# Patient Record
Sex: Female | Born: 1945
Health system: Southern US, Community
[De-identification: ages and names within clinical notes are randomized; demographics above are authoritative.]

## PROBLEM LIST (undated history)

## (undated) DIAGNOSIS — M419 Scoliosis, unspecified: Secondary | ICD-10-CM

## (undated) DIAGNOSIS — E785 Hyperlipidemia, unspecified: Secondary | ICD-10-CM

## (undated) DIAGNOSIS — R0600 Dyspnea, unspecified: Secondary | ICD-10-CM

## (undated) DIAGNOSIS — K219 Gastro-esophageal reflux disease without esophagitis: Secondary | ICD-10-CM

## (undated) DIAGNOSIS — J45909 Unspecified asthma, uncomplicated: Secondary | ICD-10-CM

## (undated) DIAGNOSIS — J189 Pneumonia, unspecified organism: Secondary | ICD-10-CM

## (undated) DIAGNOSIS — I10 Essential (primary) hypertension: Secondary | ICD-10-CM

## (undated) HISTORY — PX: SKIN SURGERY: SHX2413

## (undated) HISTORY — DX: Hyperlipidemia, unspecified: E78.5

## (undated) HISTORY — PX: WRIST FRACTURE SURGERY: SHX121

---

## 1991-05-17 HISTORY — PX: DILATION AND CURETTAGE OF UTERUS: SHX78

## 1998-05-16 HISTORY — PX: BREAST SURGERY: SHX581

## 1998-07-16 ENCOUNTER — Ambulatory Visit (HOSPITAL_COMMUNITY): Admission: RE | Admit: 1998-07-16 | Discharge: 1998-07-16 | Payer: Self-pay | Admitting: Obstetrics & Gynecology

## 1999-08-19 ENCOUNTER — Encounter: Admission: RE | Admit: 1999-08-19 | Discharge: 1999-08-19 | Payer: Self-pay | Admitting: Gynecology

## 1999-08-19 ENCOUNTER — Encounter: Payer: Self-pay | Admitting: Gynecology

## 2000-09-07 ENCOUNTER — Encounter: Payer: Self-pay | Admitting: Gynecology

## 2000-09-07 ENCOUNTER — Encounter: Admission: RE | Admit: 2000-09-07 | Discharge: 2000-09-07 | Payer: Self-pay | Admitting: Gynecology

## 2000-09-11 ENCOUNTER — Other Ambulatory Visit: Admission: RE | Admit: 2000-09-11 | Discharge: 2000-09-11 | Payer: Self-pay | Admitting: Gynecology

## 2001-09-11 ENCOUNTER — Encounter: Payer: Self-pay | Admitting: Gynecology

## 2001-09-11 ENCOUNTER — Encounter: Admission: RE | Admit: 2001-09-11 | Discharge: 2001-09-11 | Payer: Self-pay | Admitting: Gynecology

## 2001-09-18 ENCOUNTER — Other Ambulatory Visit: Admission: RE | Admit: 2001-09-18 | Discharge: 2001-09-18 | Payer: Self-pay | Admitting: Gynecology

## 2002-09-13 ENCOUNTER — Encounter: Payer: Self-pay | Admitting: Family Medicine

## 2002-09-13 ENCOUNTER — Encounter: Admission: RE | Admit: 2002-09-13 | Discharge: 2002-09-13 | Payer: Self-pay | Admitting: Family Medicine

## 2003-09-17 ENCOUNTER — Encounter: Admission: RE | Admit: 2003-09-17 | Discharge: 2003-09-17 | Payer: Self-pay | Admitting: Family Medicine

## 2004-05-16 HISTORY — PX: FACIAL COSMETIC SURGERY: SHX629

## 2004-10-05 ENCOUNTER — Encounter: Admission: RE | Admit: 2004-10-05 | Discharge: 2004-10-05 | Payer: Self-pay | Admitting: Family Medicine

## 2005-09-06 ENCOUNTER — Ambulatory Visit: Payer: Self-pay | Admitting: Internal Medicine

## 2005-09-21 ENCOUNTER — Ambulatory Visit: Payer: Self-pay | Admitting: Cardiology

## 2005-11-09 ENCOUNTER — Ambulatory Visit: Payer: Self-pay | Admitting: Internal Medicine

## 2005-11-09 ENCOUNTER — Encounter: Admission: RE | Admit: 2005-11-09 | Discharge: 2005-11-09 | Payer: Self-pay | Admitting: Family Medicine

## 2007-01-19 ENCOUNTER — Encounter: Admission: RE | Admit: 2007-01-19 | Discharge: 2007-01-19 | Payer: Self-pay | Admitting: Family Medicine

## 2008-03-11 ENCOUNTER — Encounter: Admission: RE | Admit: 2008-03-11 | Discharge: 2008-03-11 | Payer: Self-pay | Admitting: Family Medicine

## 2008-04-25 ENCOUNTER — Encounter: Admission: RE | Admit: 2008-04-25 | Discharge: 2008-04-25 | Payer: Self-pay | Admitting: Gastroenterology

## 2008-05-27 ENCOUNTER — Encounter: Admission: RE | Admit: 2008-05-27 | Discharge: 2008-05-27 | Payer: Self-pay | Admitting: Gastroenterology

## 2009-03-12 ENCOUNTER — Encounter: Admission: RE | Admit: 2009-03-12 | Discharge: 2009-03-12 | Payer: Self-pay | Admitting: Family Medicine

## 2010-04-19 ENCOUNTER — Encounter: Admission: RE | Admit: 2010-04-19 | Discharge: 2010-04-19 | Payer: Self-pay | Admitting: Family Medicine

## 2011-04-14 ENCOUNTER — Other Ambulatory Visit: Payer: Self-pay | Admitting: Family Medicine

## 2011-04-14 DIAGNOSIS — Z1231 Encounter for screening mammogram for malignant neoplasm of breast: Secondary | ICD-10-CM

## 2011-04-21 ENCOUNTER — Ambulatory Visit
Admission: RE | Admit: 2011-04-21 | Discharge: 2011-04-21 | Disposition: A | Discharge status: Home / Self Care | Payer: Medicare Other | Source: Ambulatory Visit | Attending: Family Medicine | Admitting: Family Medicine

## 2011-04-21 DIAGNOSIS — Z1231 Encounter for screening mammogram for malignant neoplasm of breast: Secondary | ICD-10-CM

## 2011-05-16 ENCOUNTER — Ambulatory Visit: Payer: Self-pay

## 2011-06-03 DIAGNOSIS — Z23 Encounter for immunization: Secondary | ICD-10-CM | POA: Diagnosis not present

## 2011-06-03 DIAGNOSIS — Z0000 Encounter for general adult medical examination without abnormal findings: Secondary | ICD-10-CM | POA: Diagnosis not present

## 2011-06-30 DIAGNOSIS — J209 Acute bronchitis, unspecified: Secondary | ICD-10-CM | POA: Diagnosis not present

## 2011-06-30 DIAGNOSIS — J45909 Unspecified asthma, uncomplicated: Secondary | ICD-10-CM | POA: Diagnosis not present

## 2011-06-30 DIAGNOSIS — J301 Allergic rhinitis due to pollen: Secondary | ICD-10-CM | POA: Diagnosis not present

## 2011-07-26 DIAGNOSIS — Z86010 Personal history of colonic polyps: Secondary | ICD-10-CM | POA: Diagnosis not present

## 2011-08-01 DIAGNOSIS — L820 Inflamed seborrheic keratosis: Secondary | ICD-10-CM | POA: Diagnosis not present

## 2011-08-01 DIAGNOSIS — L821 Other seborrheic keratosis: Secondary | ICD-10-CM | POA: Diagnosis not present

## 2011-08-03 DIAGNOSIS — K5730 Diverticulosis of large intestine without perforation or abscess without bleeding: Secondary | ICD-10-CM | POA: Diagnosis not present

## 2011-08-03 DIAGNOSIS — Z86010 Personal history of colonic polyps: Secondary | ICD-10-CM | POA: Diagnosis not present

## 2011-08-03 LAB — HM COLONOSCOPY

## 2011-08-10 DIAGNOSIS — M5137 Other intervertebral disc degeneration, lumbosacral region: Secondary | ICD-10-CM | POA: Diagnosis not present

## 2011-08-10 DIAGNOSIS — M5030 Other cervical disc degeneration, unspecified cervical region: Secondary | ICD-10-CM | POA: Diagnosis not present

## 2011-08-10 DIAGNOSIS — M5430 Sciatica, unspecified side: Secondary | ICD-10-CM | POA: Diagnosis not present

## 2011-08-10 DIAGNOSIS — M999 Biomechanical lesion, unspecified: Secondary | ICD-10-CM | POA: Diagnosis not present

## 2011-08-10 DIAGNOSIS — M9981 Other biomechanical lesions of cervical region: Secondary | ICD-10-CM | POA: Diagnosis not present

## 2011-08-30 DIAGNOSIS — J45909 Unspecified asthma, uncomplicated: Secondary | ICD-10-CM | POA: Diagnosis not present

## 2011-08-30 DIAGNOSIS — J301 Allergic rhinitis due to pollen: Secondary | ICD-10-CM | POA: Diagnosis not present

## 2012-01-30 DIAGNOSIS — Z23 Encounter for immunization: Secondary | ICD-10-CM | POA: Diagnosis not present

## 2012-03-14 ENCOUNTER — Other Ambulatory Visit: Payer: Self-pay | Admitting: Family Medicine

## 2012-03-14 DIAGNOSIS — Z1231 Encounter for screening mammogram for malignant neoplasm of breast: Secondary | ICD-10-CM

## 2012-03-15 DIAGNOSIS — J45909 Unspecified asthma, uncomplicated: Secondary | ICD-10-CM | POA: Diagnosis not present

## 2012-03-15 DIAGNOSIS — J301 Allergic rhinitis due to pollen: Secondary | ICD-10-CM | POA: Diagnosis not present

## 2012-03-26 DIAGNOSIS — Z85828 Personal history of other malignant neoplasm of skin: Secondary | ICD-10-CM | POA: Diagnosis not present

## 2012-03-26 DIAGNOSIS — L851 Acquired keratosis [keratoderma] palmaris et plantaris: Secondary | ICD-10-CM | POA: Diagnosis not present

## 2012-03-26 DIAGNOSIS — L219 Seborrheic dermatitis, unspecified: Secondary | ICD-10-CM | POA: Diagnosis not present

## 2012-03-26 DIAGNOSIS — D485 Neoplasm of uncertain behavior of skin: Secondary | ICD-10-CM | POA: Diagnosis not present

## 2012-03-26 DIAGNOSIS — L570 Actinic keratosis: Secondary | ICD-10-CM | POA: Diagnosis not present

## 2012-04-19 DIAGNOSIS — M25819 Other specified joint disorders, unspecified shoulder: Secondary | ICD-10-CM | POA: Diagnosis not present

## 2012-04-24 ENCOUNTER — Ambulatory Visit
Admission: RE | Admit: 2012-04-24 | Discharge: 2012-04-24 | Disposition: A | Discharge status: Home / Self Care | Payer: Medicare Other | Source: Ambulatory Visit | Attending: Family Medicine | Admitting: Family Medicine

## 2012-04-24 DIAGNOSIS — M720 Palmar fascial fibromatosis [Dupuytren]: Secondary | ICD-10-CM | POA: Diagnosis not present

## 2012-04-24 DIAGNOSIS — Z1231 Encounter for screening mammogram for malignant neoplasm of breast: Secondary | ICD-10-CM | POA: Diagnosis not present

## 2012-06-06 DIAGNOSIS — H6690 Otitis media, unspecified, unspecified ear: Secondary | ICD-10-CM | POA: Diagnosis not present

## 2012-06-06 DIAGNOSIS — H6980 Other specified disorders of Eustachian tube, unspecified ear: Secondary | ICD-10-CM | POA: Diagnosis not present

## 2012-06-11 DIAGNOSIS — H6980 Other specified disorders of Eustachian tube, unspecified ear: Secondary | ICD-10-CM | POA: Diagnosis not present

## 2012-06-11 DIAGNOSIS — H6690 Otitis media, unspecified, unspecified ear: Secondary | ICD-10-CM | POA: Diagnosis not present

## 2012-06-12 DIAGNOSIS — H6120 Impacted cerumen, unspecified ear: Secondary | ICD-10-CM | POA: Diagnosis not present

## 2012-06-12 DIAGNOSIS — H60509 Unspecified acute noninfective otitis externa, unspecified ear: Secondary | ICD-10-CM | POA: Diagnosis not present

## 2012-06-14 DIAGNOSIS — H60509 Unspecified acute noninfective otitis externa, unspecified ear: Secondary | ICD-10-CM | POA: Diagnosis not present

## 2012-06-14 DIAGNOSIS — H6120 Impacted cerumen, unspecified ear: Secondary | ICD-10-CM | POA: Diagnosis not present

## 2012-06-25 DIAGNOSIS — H908 Mixed conductive and sensorineural hearing loss, unspecified: Secondary | ICD-10-CM | POA: Diagnosis not present

## 2012-06-25 DIAGNOSIS — H6120 Impacted cerumen, unspecified ear: Secondary | ICD-10-CM | POA: Diagnosis not present

## 2012-06-25 DIAGNOSIS — H7290 Unspecified perforation of tympanic membrane, unspecified ear: Secondary | ICD-10-CM | POA: Diagnosis not present

## 2012-06-25 DIAGNOSIS — H60509 Unspecified acute noninfective otitis externa, unspecified ear: Secondary | ICD-10-CM | POA: Diagnosis not present

## 2012-06-25 DIAGNOSIS — H905 Unspecified sensorineural hearing loss: Secondary | ICD-10-CM | POA: Diagnosis not present

## 2012-06-26 DIAGNOSIS — H6120 Impacted cerumen, unspecified ear: Secondary | ICD-10-CM | POA: Diagnosis not present

## 2012-06-26 DIAGNOSIS — H908 Mixed conductive and sensorineural hearing loss, unspecified: Secondary | ICD-10-CM | POA: Diagnosis not present

## 2012-06-26 DIAGNOSIS — H60509 Unspecified acute noninfective otitis externa, unspecified ear: Secondary | ICD-10-CM | POA: Diagnosis not present

## 2012-06-26 DIAGNOSIS — H7290 Unspecified perforation of tympanic membrane, unspecified ear: Secondary | ICD-10-CM | POA: Diagnosis not present

## 2012-06-27 DIAGNOSIS — H908 Mixed conductive and sensorineural hearing loss, unspecified: Secondary | ICD-10-CM | POA: Diagnosis not present

## 2012-06-27 DIAGNOSIS — H60509 Unspecified acute noninfective otitis externa, unspecified ear: Secondary | ICD-10-CM | POA: Diagnosis not present

## 2012-06-27 DIAGNOSIS — H7290 Unspecified perforation of tympanic membrane, unspecified ear: Secondary | ICD-10-CM | POA: Diagnosis not present

## 2012-06-27 DIAGNOSIS — H6120 Impacted cerumen, unspecified ear: Secondary | ICD-10-CM | POA: Diagnosis not present

## 2012-06-29 DIAGNOSIS — H905 Unspecified sensorineural hearing loss: Secondary | ICD-10-CM | POA: Diagnosis not present

## 2012-06-29 DIAGNOSIS — H6980 Other specified disorders of Eustachian tube, unspecified ear: Secondary | ICD-10-CM | POA: Diagnosis not present

## 2012-06-29 DIAGNOSIS — H908 Mixed conductive and sensorineural hearing loss, unspecified: Secondary | ICD-10-CM | POA: Diagnosis not present

## 2012-07-02 DIAGNOSIS — H7290 Unspecified perforation of tympanic membrane, unspecified ear: Secondary | ICD-10-CM | POA: Diagnosis not present

## 2012-07-02 DIAGNOSIS — H908 Mixed conductive and sensorineural hearing loss, unspecified: Secondary | ICD-10-CM | POA: Diagnosis not present

## 2012-07-02 DIAGNOSIS — H60509 Unspecified acute noninfective otitis externa, unspecified ear: Secondary | ICD-10-CM | POA: Diagnosis not present

## 2012-07-02 DIAGNOSIS — H6120 Impacted cerumen, unspecified ear: Secondary | ICD-10-CM | POA: Diagnosis not present

## 2012-07-17 DIAGNOSIS — H6120 Impacted cerumen, unspecified ear: Secondary | ICD-10-CM | POA: Diagnosis not present

## 2012-07-17 DIAGNOSIS — H7290 Unspecified perforation of tympanic membrane, unspecified ear: Secondary | ICD-10-CM | POA: Diagnosis not present

## 2012-07-17 DIAGNOSIS — H905 Unspecified sensorineural hearing loss: Secondary | ICD-10-CM | POA: Diagnosis not present

## 2012-07-17 DIAGNOSIS — B369 Superficial mycosis, unspecified: Secondary | ICD-10-CM | POA: Diagnosis not present

## 2012-08-20 DIAGNOSIS — B369 Superficial mycosis, unspecified: Secondary | ICD-10-CM | POA: Diagnosis not present

## 2012-08-20 DIAGNOSIS — H60509 Unspecified acute noninfective otitis externa, unspecified ear: Secondary | ICD-10-CM | POA: Diagnosis not present

## 2012-10-16 DIAGNOSIS — L821 Other seborrheic keratosis: Secondary | ICD-10-CM | POA: Diagnosis not present

## 2012-10-16 DIAGNOSIS — Z85828 Personal history of other malignant neoplasm of skin: Secondary | ICD-10-CM | POA: Diagnosis not present

## 2012-10-16 DIAGNOSIS — L578 Other skin changes due to chronic exposure to nonionizing radiation: Secondary | ICD-10-CM | POA: Diagnosis not present

## 2012-10-16 DIAGNOSIS — L820 Inflamed seborrheic keratosis: Secondary | ICD-10-CM | POA: Diagnosis not present

## 2012-10-16 DIAGNOSIS — L570 Actinic keratosis: Secondary | ICD-10-CM | POA: Diagnosis not present

## 2012-10-18 DIAGNOSIS — J301 Allergic rhinitis due to pollen: Secondary | ICD-10-CM | POA: Diagnosis not present

## 2012-10-18 DIAGNOSIS — J45909 Unspecified asthma, uncomplicated: Secondary | ICD-10-CM | POA: Diagnosis not present

## 2012-12-31 DIAGNOSIS — C4492 Squamous cell carcinoma of skin, unspecified: Secondary | ICD-10-CM | POA: Diagnosis not present

## 2012-12-31 DIAGNOSIS — L578 Other skin changes due to chronic exposure to nonionizing radiation: Secondary | ICD-10-CM | POA: Diagnosis not present

## 2012-12-31 DIAGNOSIS — Z85828 Personal history of other malignant neoplasm of skin: Secondary | ICD-10-CM | POA: Diagnosis not present

## 2012-12-31 DIAGNOSIS — D485 Neoplasm of uncertain behavior of skin: Secondary | ICD-10-CM | POA: Diagnosis not present

## 2012-12-31 DIAGNOSIS — C44621 Squamous cell carcinoma of skin of unspecified upper limb, including shoulder: Secondary | ICD-10-CM | POA: Diagnosis not present

## 2013-01-03 DIAGNOSIS — B369 Superficial mycosis, unspecified: Secondary | ICD-10-CM | POA: Diagnosis not present

## 2013-01-03 DIAGNOSIS — H60509 Unspecified acute noninfective otitis externa, unspecified ear: Secondary | ICD-10-CM | POA: Diagnosis not present

## 2013-01-03 DIAGNOSIS — H6120 Impacted cerumen, unspecified ear: Secondary | ICD-10-CM | POA: Diagnosis not present

## 2013-01-09 DIAGNOSIS — H60509 Unspecified acute noninfective otitis externa, unspecified ear: Secondary | ICD-10-CM | POA: Diagnosis not present

## 2013-03-14 DIAGNOSIS — L578 Other skin changes due to chronic exposure to nonionizing radiation: Secondary | ICD-10-CM | POA: Diagnosis not present

## 2013-03-14 DIAGNOSIS — D485 Neoplasm of uncertain behavior of skin: Secondary | ICD-10-CM | POA: Diagnosis not present

## 2013-03-14 DIAGNOSIS — Z85828 Personal history of other malignant neoplasm of skin: Secondary | ICD-10-CM | POA: Diagnosis not present

## 2013-03-14 DIAGNOSIS — L570 Actinic keratosis: Secondary | ICD-10-CM | POA: Diagnosis not present

## 2013-03-18 DIAGNOSIS — H6980 Other specified disorders of Eustachian tube, unspecified ear: Secondary | ICD-10-CM | POA: Diagnosis not present

## 2013-03-18 DIAGNOSIS — Z1330 Encounter for screening examination for mental health and behavioral disorders, unspecified: Secondary | ICD-10-CM | POA: Diagnosis not present

## 2013-03-18 DIAGNOSIS — Z1331 Encounter for screening for depression: Secondary | ICD-10-CM | POA: Diagnosis not present

## 2013-03-18 DIAGNOSIS — Z01419 Encounter for gynecological examination (general) (routine) without abnormal findings: Secondary | ICD-10-CM | POA: Diagnosis not present

## 2013-03-18 DIAGNOSIS — Z0000 Encounter for general adult medical examination without abnormal findings: Secondary | ICD-10-CM | POA: Diagnosis not present

## 2013-03-18 DIAGNOSIS — Z124 Encounter for screening for malignant neoplasm of cervix: Secondary | ICD-10-CM | POA: Diagnosis not present

## 2013-04-30 ENCOUNTER — Other Ambulatory Visit: Payer: Self-pay

## 2013-04-30 DIAGNOSIS — Z1231 Encounter for screening mammogram for malignant neoplasm of breast: Secondary | ICD-10-CM

## 2013-05-03 DIAGNOSIS — R7309 Other abnormal glucose: Secondary | ICD-10-CM | POA: Diagnosis not present

## 2013-05-03 DIAGNOSIS — E785 Hyperlipidemia, unspecified: Secondary | ICD-10-CM | POA: Diagnosis not present

## 2013-05-03 DIAGNOSIS — R7989 Other specified abnormal findings of blood chemistry: Secondary | ICD-10-CM | POA: Diagnosis not present

## 2013-05-20 DIAGNOSIS — H60509 Unspecified acute noninfective otitis externa, unspecified ear: Secondary | ICD-10-CM | POA: Diagnosis not present

## 2013-05-27 DIAGNOSIS — H60509 Unspecified acute noninfective otitis externa, unspecified ear: Secondary | ICD-10-CM | POA: Diagnosis not present

## 2013-06-04 DIAGNOSIS — H5200 Hypermetropia, unspecified eye: Secondary | ICD-10-CM | POA: Diagnosis not present

## 2013-06-18 DIAGNOSIS — Z85828 Personal history of other malignant neoplasm of skin: Secondary | ICD-10-CM | POA: Diagnosis not present

## 2013-06-18 DIAGNOSIS — L570 Actinic keratosis: Secondary | ICD-10-CM | POA: Diagnosis not present

## 2013-06-18 DIAGNOSIS — L578 Other skin changes due to chronic exposure to nonionizing radiation: Secondary | ICD-10-CM | POA: Diagnosis not present

## 2013-06-18 DIAGNOSIS — L820 Inflamed seborrheic keratosis: Secondary | ICD-10-CM | POA: Diagnosis not present

## 2013-06-18 DIAGNOSIS — L821 Other seborrheic keratosis: Secondary | ICD-10-CM | POA: Diagnosis not present

## 2013-07-12 DIAGNOSIS — M6740 Ganglion, unspecified site: Secondary | ICD-10-CM | POA: Diagnosis not present

## 2013-07-30 DIAGNOSIS — H60509 Unspecified acute noninfective otitis externa, unspecified ear: Secondary | ICD-10-CM | POA: Diagnosis not present

## 2013-07-30 DIAGNOSIS — H6120 Impacted cerumen, unspecified ear: Secondary | ICD-10-CM | POA: Diagnosis not present

## 2013-08-20 DIAGNOSIS — H6120 Impacted cerumen, unspecified ear: Secondary | ICD-10-CM | POA: Diagnosis not present

## 2013-08-20 DIAGNOSIS — H60509 Unspecified acute noninfective otitis externa, unspecified ear: Secondary | ICD-10-CM | POA: Diagnosis not present

## 2013-08-26 DIAGNOSIS — H60509 Unspecified acute noninfective otitis externa, unspecified ear: Secondary | ICD-10-CM | POA: Diagnosis not present

## 2013-08-27 DIAGNOSIS — M720 Palmar fascial fibromatosis [Dupuytren]: Secondary | ICD-10-CM | POA: Diagnosis not present

## 2013-09-16 DIAGNOSIS — D0470 Carcinoma in situ of skin of unspecified lower limb, including hip: Secondary | ICD-10-CM | POA: Diagnosis not present

## 2013-09-16 DIAGNOSIS — L820 Inflamed seborrheic keratosis: Secondary | ICD-10-CM | POA: Diagnosis not present

## 2013-09-16 DIAGNOSIS — D485 Neoplasm of uncertain behavior of skin: Secondary | ICD-10-CM | POA: Diagnosis not present

## 2013-09-16 DIAGNOSIS — Z85828 Personal history of other malignant neoplasm of skin: Secondary | ICD-10-CM | POA: Diagnosis not present

## 2013-09-16 DIAGNOSIS — L578 Other skin changes due to chronic exposure to nonionizing radiation: Secondary | ICD-10-CM | POA: Diagnosis not present

## 2013-10-01 DIAGNOSIS — D0470 Carcinoma in situ of skin of unspecified lower limb, including hip: Secondary | ICD-10-CM | POA: Diagnosis not present

## 2013-10-16 DIAGNOSIS — H60509 Unspecified acute noninfective otitis externa, unspecified ear: Secondary | ICD-10-CM | POA: Diagnosis not present

## 2013-10-29 DIAGNOSIS — H6120 Impacted cerumen, unspecified ear: Secondary | ICD-10-CM | POA: Diagnosis not present

## 2013-10-29 DIAGNOSIS — H60509 Unspecified acute noninfective otitis externa, unspecified ear: Secondary | ICD-10-CM | POA: Diagnosis not present

## 2013-10-31 DIAGNOSIS — H60509 Unspecified acute noninfective otitis externa, unspecified ear: Secondary | ICD-10-CM | POA: Diagnosis not present

## 2013-11-11 DIAGNOSIS — H60509 Unspecified acute noninfective otitis externa, unspecified ear: Secondary | ICD-10-CM | POA: Diagnosis not present

## 2014-01-23 ENCOUNTER — Ambulatory Visit
Admission: RE | Admit: 2014-01-23 | Discharge: 2014-01-23 | Disposition: A | Discharge status: Home / Self Care | Payer: Medicare Other | Source: Ambulatory Visit

## 2014-01-23 DIAGNOSIS — D239 Other benign neoplasm of skin, unspecified: Secondary | ICD-10-CM | POA: Diagnosis not present

## 2014-01-23 DIAGNOSIS — L578 Other skin changes due to chronic exposure to nonionizing radiation: Secondary | ICD-10-CM | POA: Diagnosis not present

## 2014-01-23 DIAGNOSIS — Z85828 Personal history of other malignant neoplasm of skin: Secondary | ICD-10-CM | POA: Diagnosis not present

## 2014-01-23 DIAGNOSIS — Z1283 Encounter for screening for malignant neoplasm of skin: Secondary | ICD-10-CM | POA: Diagnosis not present

## 2014-01-23 DIAGNOSIS — L820 Inflamed seborrheic keratosis: Secondary | ICD-10-CM | POA: Diagnosis not present

## 2014-01-23 DIAGNOSIS — Z1231 Encounter for screening mammogram for malignant neoplasm of breast: Secondary | ICD-10-CM

## 2014-01-23 DIAGNOSIS — L570 Actinic keratosis: Secondary | ICD-10-CM | POA: Diagnosis not present

## 2014-03-12 DIAGNOSIS — Z23 Encounter for immunization: Secondary | ICD-10-CM | POA: Diagnosis not present

## 2014-03-20 DIAGNOSIS — K759 Inflammatory liver disease, unspecified: Secondary | ICD-10-CM | POA: Diagnosis not present

## 2014-03-20 DIAGNOSIS — J4520 Mild intermittent asthma, uncomplicated: Secondary | ICD-10-CM | POA: Diagnosis not present

## 2014-03-20 DIAGNOSIS — Z1389 Encounter for screening for other disorder: Secondary | ICD-10-CM | POA: Diagnosis not present

## 2014-03-20 DIAGNOSIS — E785 Hyperlipidemia, unspecified: Secondary | ICD-10-CM | POA: Diagnosis not present

## 2014-03-20 DIAGNOSIS — Z0000 Encounter for general adult medical examination without abnormal findings: Secondary | ICD-10-CM | POA: Diagnosis not present

## 2014-03-21 DIAGNOSIS — R739 Hyperglycemia, unspecified: Secondary | ICD-10-CM | POA: Diagnosis not present

## 2014-03-21 DIAGNOSIS — E785 Hyperlipidemia, unspecified: Secondary | ICD-10-CM | POA: Diagnosis not present

## 2014-04-14 DIAGNOSIS — N3001 Acute cystitis with hematuria: Secondary | ICD-10-CM | POA: Diagnosis not present

## 2014-04-14 DIAGNOSIS — K759 Inflammatory liver disease, unspecified: Secondary | ICD-10-CM | POA: Diagnosis not present

## 2014-04-14 DIAGNOSIS — J4520 Mild intermittent asthma, uncomplicated: Secondary | ICD-10-CM | POA: Diagnosis not present

## 2014-04-14 DIAGNOSIS — E785 Hyperlipidemia, unspecified: Secondary | ICD-10-CM | POA: Diagnosis not present

## 2014-04-14 DIAGNOSIS — Z1389 Encounter for screening for other disorder: Secondary | ICD-10-CM | POA: Diagnosis not present

## 2014-04-16 DIAGNOSIS — Z23 Encounter for immunization: Secondary | ICD-10-CM | POA: Diagnosis not present

## 2014-04-16 DIAGNOSIS — Z1389 Encounter for screening for other disorder: Secondary | ICD-10-CM | POA: Diagnosis not present

## 2014-04-16 DIAGNOSIS — J4520 Mild intermittent asthma, uncomplicated: Secondary | ICD-10-CM | POA: Diagnosis not present

## 2014-04-16 DIAGNOSIS — K759 Inflammatory liver disease, unspecified: Secondary | ICD-10-CM | POA: Diagnosis not present

## 2014-04-16 DIAGNOSIS — E785 Hyperlipidemia, unspecified: Secondary | ICD-10-CM | POA: Diagnosis not present

## 2014-05-07 ENCOUNTER — Ambulatory Visit: Payer: Self-pay | Admitting: Family Medicine

## 2014-05-07 DIAGNOSIS — Z13820 Encounter for screening for osteoporosis: Secondary | ICD-10-CM | POA: Diagnosis not present

## 2014-05-07 DIAGNOSIS — Z780 Asymptomatic menopausal state: Secondary | ICD-10-CM | POA: Diagnosis not present

## 2014-05-07 DIAGNOSIS — M810 Age-related osteoporosis without current pathological fracture: Secondary | ICD-10-CM | POA: Diagnosis not present

## 2014-05-07 DIAGNOSIS — M8580 Other specified disorders of bone density and structure, unspecified site: Secondary | ICD-10-CM | POA: Diagnosis not present

## 2014-05-07 LAB — HM DEXA SCAN

## 2014-06-12 DIAGNOSIS — L821 Other seborrheic keratosis: Secondary | ICD-10-CM | POA: Diagnosis not present

## 2014-06-12 DIAGNOSIS — L814 Other melanin hyperpigmentation: Secondary | ICD-10-CM | POA: Diagnosis not present

## 2014-06-12 DIAGNOSIS — L820 Inflamed seborrheic keratosis: Secondary | ICD-10-CM | POA: Diagnosis not present

## 2014-06-12 DIAGNOSIS — L570 Actinic keratosis: Secondary | ICD-10-CM | POA: Diagnosis not present

## 2014-06-12 DIAGNOSIS — L578 Other skin changes due to chronic exposure to nonionizing radiation: Secondary | ICD-10-CM | POA: Diagnosis not present

## 2014-06-20 DIAGNOSIS — Z1389 Encounter for screening for other disorder: Secondary | ICD-10-CM | POA: Diagnosis not present

## 2014-06-20 DIAGNOSIS — E785 Hyperlipidemia, unspecified: Secondary | ICD-10-CM | POA: Diagnosis not present

## 2014-06-20 DIAGNOSIS — R739 Hyperglycemia, unspecified: Secondary | ICD-10-CM | POA: Diagnosis not present

## 2014-06-20 DIAGNOSIS — M810 Age-related osteoporosis without current pathological fracture: Secondary | ICD-10-CM | POA: Diagnosis not present

## 2014-06-20 DIAGNOSIS — K759 Inflammatory liver disease, unspecified: Secondary | ICD-10-CM | POA: Diagnosis not present

## 2014-07-10 DIAGNOSIS — H43811 Vitreous degeneration, right eye: Secondary | ICD-10-CM | POA: Diagnosis not present

## 2014-08-07 DIAGNOSIS — H43811 Vitreous degeneration, right eye: Secondary | ICD-10-CM | POA: Diagnosis not present

## 2014-09-10 DIAGNOSIS — M940 Chondrocostal junction syndrome [Tietze]: Secondary | ICD-10-CM | POA: Diagnosis not present

## 2014-09-10 DIAGNOSIS — E785 Hyperlipidemia, unspecified: Secondary | ICD-10-CM | POA: Diagnosis not present

## 2014-09-10 DIAGNOSIS — M549 Dorsalgia, unspecified: Secondary | ICD-10-CM | POA: Diagnosis not present

## 2014-09-10 DIAGNOSIS — Z1389 Encounter for screening for other disorder: Secondary | ICD-10-CM | POA: Diagnosis not present

## 2014-09-10 DIAGNOSIS — K759 Inflammatory liver disease, unspecified: Secondary | ICD-10-CM | POA: Diagnosis not present

## 2014-09-11 ENCOUNTER — Ambulatory Visit
Admit: 2014-09-11 | Disposition: A | Discharge status: Home / Self Care | Payer: Self-pay | Attending: Family Medicine | Admitting: Family Medicine

## 2014-09-11 DIAGNOSIS — J45909 Unspecified asthma, uncomplicated: Secondary | ICD-10-CM | POA: Diagnosis not present

## 2014-09-11 DIAGNOSIS — M8588 Other specified disorders of bone density and structure, other site: Secondary | ICD-10-CM | POA: Diagnosis not present

## 2014-09-11 DIAGNOSIS — M47815 Spondylosis without myelopathy or radiculopathy, thoracolumbar region: Secondary | ICD-10-CM | POA: Diagnosis not present

## 2014-09-11 DIAGNOSIS — M47813 Spondylosis without myelopathy or radiculopathy, cervicothoracic region: Secondary | ICD-10-CM | POA: Diagnosis not present

## 2014-09-11 DIAGNOSIS — J449 Chronic obstructive pulmonary disease, unspecified: Secondary | ICD-10-CM | POA: Diagnosis not present

## 2014-10-20 ENCOUNTER — Telehealth: Payer: Self-pay | Admitting: Family Medicine

## 2014-10-20 MED ORDER — AZELASTINE HCL 0.15 % NA SOLN: 1.0000 | Freq: Two times a day (BID) | NASAL | Status: DC

## 2014-10-20 NOTE — Telephone Encounter (Signed)
Pt is requesting for a refill on Astepro 0.15% 2 spray each daily.  [employer].  [phone]

## 2014-10-20 NOTE — Telephone Encounter (Signed)
Sent prescription into pharmacy. Notified pt. Renaldo Fiddler, CMA

## 2014-10-20 NOTE — Telephone Encounter (Signed)
Please put in order. Thanks

## 2014-10-31 DIAGNOSIS — R739 Hyperglycemia, unspecified: Secondary | ICD-10-CM | POA: Insufficient documentation

## 2014-10-31 DIAGNOSIS — R7989 Other specified abnormal findings of blood chemistry: Secondary | ICD-10-CM | POA: Insufficient documentation

## 2014-10-31 DIAGNOSIS — M810 Age-related osteoporosis without current pathological fracture: Secondary | ICD-10-CM | POA: Insufficient documentation

## 2014-10-31 DIAGNOSIS — E785 Hyperlipidemia, unspecified: Secondary | ICD-10-CM | POA: Insufficient documentation

## 2014-10-31 DIAGNOSIS — J302 Other seasonal allergic rhinitis: Secondary | ICD-10-CM | POA: Insufficient documentation

## 2014-10-31 DIAGNOSIS — J45909 Unspecified asthma, uncomplicated: Secondary | ICD-10-CM | POA: Insufficient documentation

## 2014-10-31 DIAGNOSIS — R945 Abnormal results of liver function studies: Secondary | ICD-10-CM | POA: Insufficient documentation

## 2014-10-31 HISTORY — DX: Other specified abnormal findings of blood chemistry: R79.89

## 2014-12-09 ENCOUNTER — Telehealth: Payer: Self-pay | Admitting: Family Medicine

## 2014-12-09 DIAGNOSIS — R739 Hyperglycemia, unspecified: Secondary | ICD-10-CM

## 2014-12-09 DIAGNOSIS — E785 Hyperlipidemia, unspecified: Secondary | ICD-10-CM

## 2014-12-09 NOTE — Telephone Encounter (Signed)
PT stated she was told to call a week before her appt with Dr. Venia Minks to request a lab slip for the following labs: lipid panel, Met C, & HGA/1C. Pt would like to pick up the lab slip Wednesday or Thursday morning to have the labs done Thursday morning. Thanks TNP

## 2014-12-09 NOTE — Telephone Encounter (Signed)
Printed labs slip for pt to pick up. Pt was advised that lab slips are ready to pick up, pt stated that she will pick them up tomorrow.

## 2014-12-11 DIAGNOSIS — E785 Hyperlipidemia, unspecified: Secondary | ICD-10-CM | POA: Diagnosis not present

## 2014-12-11 DIAGNOSIS — R739 Hyperglycemia, unspecified: Secondary | ICD-10-CM | POA: Diagnosis not present

## 2014-12-12 ENCOUNTER — Telehealth: Payer: Self-pay

## 2014-12-12 LAB — COMPREHENSIVE METABOLIC PANEL
A/G RATIO: 1.8 (ref 1.1–2.5)
ALK PHOS: 101 IU/L (ref 39–117)
ALT: 34 IU/L — AB (ref 0–32)
AST: 31 IU/L (ref 0–40)
Albumin: 4.4 g/dL (ref 3.5–4.8)
BILIRUBIN TOTAL: 0.5 mg/dL (ref 0.0–1.2)
BUN / CREAT RATIO: 20 (ref 11–26)
BUN: 16 mg/dL (ref 8–27)
CHLORIDE: 99 mmol/L (ref 97–108)
CO2: 23 mmol/L (ref 18–29)
CREATININE: 0.82 mg/dL (ref 0.57–1.00)
Calcium: 9.6 mg/dL (ref 8.7–10.3)
GFR, EST AFRICAN AMERICAN: 84 mL/min/{1.73_m2} (ref >59)
GFR, EST NON AFRICAN AMERICAN: 73 mL/min/{1.73_m2} (ref >59)
Globulin, Total: 2.5 g/dL (ref 1.5–4.5)
Glucose: 100 mg/dL — ABNORMAL HIGH (ref 65–99)
POTASSIUM: 4.4 mmol/L (ref 3.5–5.2)
Sodium: 140 mmol/L (ref 134–144)
TOTAL PROTEIN: 6.9 g/dL (ref 6.0–8.5)

## 2014-12-12 LAB — LIPID PANEL WITH LDL/HDL RATIO
Cholesterol, Total: 222 mg/dL — ABNORMAL HIGH (ref 100–199)
HDL: 72 mg/dL (ref >39)
LDL Calculated: 134 mg/dL — ABNORMAL HIGH (ref 0–99)
LDL/HDL RATIO: 1.9 ratio (ref 0.0–3.2)
TRIGLYCERIDES: 82 mg/dL (ref 0–149)
VLDL CHOLESTEROL CAL: 16 mg/dL (ref 5–40)

## 2014-12-12 LAB — HEMOGLOBIN A1C
Est. average glucose Bld gHb Est-mCnc: 117 mg/dL
HEMOGLOBIN A1C: 5.7 % — AB (ref 4.8–5.6)

## 2014-12-12 NOTE — Telephone Encounter (Signed)
-----   Message from Margarita Rana, MD sent at 12/12/2014  2:13 PM EDT ----- Labs stable. Cholesterol is about the same. 10 year risk of heart disease is 8 percent. Can start statin if she would like or continue to work on lifestyle changes.  Thanks.

## 2014-12-12 NOTE — Telephone Encounter (Signed)
Tried calling; no answer 12/12/2014  Thanks,   -Mickel Baas

## 2014-12-15 DIAGNOSIS — Z1283 Encounter for screening for malignant neoplasm of skin: Secondary | ICD-10-CM | POA: Diagnosis not present

## 2014-12-15 DIAGNOSIS — D485 Neoplasm of uncertain behavior of skin: Secondary | ICD-10-CM | POA: Diagnosis not present

## 2014-12-15 DIAGNOSIS — I789 Disease of capillaries, unspecified: Secondary | ICD-10-CM | POA: Diagnosis not present

## 2014-12-15 DIAGNOSIS — D1800 Hemangioma unspecified site: Secondary | ICD-10-CM | POA: Diagnosis not present

## 2014-12-15 DIAGNOSIS — D692 Other nonthrombocytopenic purpura: Secondary | ICD-10-CM | POA: Diagnosis not present

## 2014-12-15 DIAGNOSIS — L821 Other seborrheic keratosis: Secondary | ICD-10-CM | POA: Diagnosis not present

## 2014-12-15 DIAGNOSIS — L578 Other skin changes due to chronic exposure to nonionizing radiation: Secondary | ICD-10-CM | POA: Diagnosis not present

## 2014-12-15 DIAGNOSIS — L814 Other melanin hyperpigmentation: Secondary | ICD-10-CM | POA: Diagnosis not present

## 2014-12-15 DIAGNOSIS — Z85828 Personal history of other malignant neoplasm of skin: Secondary | ICD-10-CM | POA: Diagnosis not present

## 2014-12-15 DIAGNOSIS — D229 Melanocytic nevi, unspecified: Secondary | ICD-10-CM | POA: Diagnosis not present

## 2014-12-15 NOTE — Telephone Encounter (Signed)
Tried calling; no answer 12/15/2014   Thanks,   -Mickel Baas

## 2014-12-16 NOTE — Telephone Encounter (Signed)
Advised pt of the above results, pt stated that she has an appointment with MD tomorrow, and verbalized fully understanding of the results.

## 2014-12-17 ENCOUNTER — Encounter: Payer: Self-pay | Admitting: Family Medicine

## 2014-12-17 ENCOUNTER — Ambulatory Visit (INDEPENDENT_AMBULATORY_CARE_PROVIDER_SITE_OTHER): Payer: Medicare Other | Admitting: Family Medicine

## 2014-12-17 VITALS — BP 124/82 | HR 76 | Temp 97.9°F | Resp 16 | Ht 60.0 in | Wt 134.0 lb

## 2014-12-17 DIAGNOSIS — E785 Hyperlipidemia, unspecified: Secondary | ICD-10-CM | POA: Diagnosis not present

## 2014-12-17 DIAGNOSIS — R739 Hyperglycemia, unspecified: Secondary | ICD-10-CM

## 2014-12-17 MED ORDER — ASPIRIN 81 MG PO CHEW: 81.0000 mg | CHEWABLE_TABLET | Freq: Every day | ORAL | Status: DC

## 2014-12-17 NOTE — Progress Notes (Signed)
Subjective:    Patient ID: Alpha, female    DOB: Jan 11, 1946, 69 y.o.   MRN: [id number]  Hyperlipidemia Recent lipid tests were reviewed and are high. Pertinent negatives include no chest pain, focal sensory loss, focal weakness, leg pain, myalgias or shortness of breath. She is currently on no antihyperlipidemic treatment.  Pt was advised to consider starting a Statin vs working on lifestyle changes.  Hyperglycemia Pt comes in to FU on elevated BS. Pt denies polyuria, polyphagia, polydipsia, fatigue. Last A1C was 12/11/2014 and was 5.7%.  Review of Systems  Constitutional: Negative for fever, chills, diaphoresis, activity change, appetite change, fatigue and unexpected weight change.  Respiratory: Negative for shortness of breath.   Cardiovascular: Negative for chest pain, palpitations and leg swelling.  Endocrine: Negative for polydipsia, polyphagia and polyuria.  Musculoskeletal: Negative for myalgias.       Rib Pain is present  Neurological: Negative for focal weakness.     Recent Results (from the past 2160 hour(s))  Lipid Panel With LDL/HDL Ratio     Status: Abnormal   Collection Time: 12/11/14  8:13 AM  Result Value Ref Range   Cholesterol, Total 222 (H) 100 - 199 mg/dL   Triglycerides 82 0 - 149 mg/dL   HDL 72 >39 mg/dL    Comment: According to ATP-III Guidelines, HDL-C >59 mg/dL is considered a negative risk factor for CHD.    VLDL Cholesterol Cal 16 5 - 40 mg/dL   LDL Calculated 134 (H) 0 - 99 mg/dL   LDl/HDL Ratio 1.9 0.0 - 3.2 ratio units    Comment:                                     LDL/HDL Ratio                                             Men  Women                               1/2 Avg.Risk  1.0    1.5                                   Avg.Risk  3.6    3.2                                2X Avg.Risk  6.2    5.0                                3X Avg.Risk  8.0    6.1   Hemoglobin A1c     Status: Abnormal   Collection Time: 12/11/14  8:13 AM   Result Value Ref Range   Hgb A1c MFr Bld 5.7 (H) 4.8 - 5.6 %    Comment:          Pre-diabetes: 5.7 - 6.4          Diabetes: >6.4          Glycemic control for adults with diabetes: <7.0  Est. average glucose Bld gHb Est-mCnc 117 mg/dL  Comprehensive metabolic panel     Status: Abnormal   Collection Time: 12/11/14  8:13 AM  Result Value Ref Range   Glucose 100 (H) 65 - 99 mg/dL    Comment: Specimen received in contact with cells. No visible hemolysis present. However GLUC may be decreased and K increased. Clinical correlation indicated.    BUN 16 8 - 27 mg/dL   Creatinine, Ser 0.82 0.57 - 1.00 mg/dL   GFR calc non Af Amer 73 >59 mL/min/1.73   GFR calc Af Amer 84 >59 mL/min/1.73   BUN/Creatinine Ratio 20 11 - 26   Sodium 140 134 - 144 mmol/L   Potassium 4.4 3.5 - 5.2 mmol/L   Chloride 99 97 - 108 mmol/L   CO2 23 18 - 29 mmol/L   Calcium 9.6 8.7 - 10.3 mg/dL   Total Protein 6.9 6.0 - 8.5 g/dL   Albumin 4.4 3.5 - 4.8 g/dL   Globulin, Total 2.5 1.5 - 4.5 g/dL   Albumin/Globulin Ratio 1.8 1.1 - 2.5   Bilirubin Total 0.5 0.0 - 1.2 mg/dL   Alkaline Phosphatase 101 39 - 117 IU/L   AST 31 0 - 40 IU/L   ALT 34 (H) 0 - 32 IU/L   BP 124/82 mmHg  Pulse 76  Temp(Src) 97.9 F (36.6 C) (Oral)  Resp 16  Ht 5' (1.524 m)  Wt 134 lb (60.782 kg)  BMI 26.17 kg/m2   Patient Active Problem List   Diagnosis Date Noted  . Asthma 10/31/2014  . Abnormal LFTs 10/31/2014  . Blood glucose elevated 10/31/2014  . HLD (hyperlipidemia) 10/31/2014  . OP (osteoporosis) 10/31/2014  . Allergic rhinitis, seasonal 10/31/2014   No past medical history on file. Current Outpatient Prescriptions on File Prior to Visit  Medication Sig  . Azelastine HCl 0.15 % SOLN Place 1 spray into the nose 2 (two) times daily.  . budesonide-formoterol (SYMBICORT) 80-4.5 MCG/ACT inhaler Inhale into the lungs.  . meloxicam (MOBIC) 7.5 MG tablet Take by mouth.  . montelukast (SINGULAIR) 10 MG tablet Take by mouth.    No current facility-administered medications on file prior to visit.   Allergies  Allergen Reactions  . Celecoxib   . Sulfa Antibiotics   . Pseudoephedrine Rash   Past Surgical History  Procedure Laterality Date  . Facial cosmetic surgery  2006  . Breast surgery Left 2000    biopsy  . Dilation and curettage of uterus  1993  . Cesarean section  05/08/1978, 07/03/1979   History   Social History  . Marital Status: Widowed    Spouse Name: N/A  . Number of Children: N/A  . Years of Education: N/A   Occupational History  . Not on file.   Social History Main Topics  . Smoking status: Former Smoker    Quit date: 05/16/1975  . Smokeless tobacco: Never Used  . Alcohol Use: 2.4 - 4.8 oz/week    4-8 Glasses of wine per week  . Drug Use: No  . Sexual Activity: Not on file   Other Topics Concern  . Not on file   Social History Narrative   Family History  Problem Relation Age of Onset  . Heart murmur Mother   . Vision loss Father   . Bladder Cancer Brother        Objective:   Physical Exam  Constitutional: She is oriented to person, place, and time. She appears well-developed and well-nourished.  Neurological: She is alert  and oriented to person, place, and time.   BP 124/82 mmHg  Pulse 76  Temp(Src) 97.9 F (36.6 C) (Oral)  Resp 16  Ht 5' (1.524 m)  Wt 134 lb (60.782 kg)  BMI 26.17 kg/m2       Assessment & Plan:  1. HLD (hyperlipidemia) Cholesterol really only mildly elevated, with high good cholesterol. Discussed lifestyle modifications at length. Patient to continue lifestyle changes. Spent 20 plus minutes of face time discussing labs and strategies for improvement.   2. Blood glucose elevated As above. Will keep close eye on blood sugar. Keeping it under good control. If develops diabetes, will need to start cholesterol medication also. Recheck in 6 months.   Margarita Rana, MD

## 2014-12-19 ENCOUNTER — Other Ambulatory Visit: Payer: Self-pay

## 2014-12-19 DIAGNOSIS — Z1231 Encounter for screening mammogram for malignant neoplasm of breast: Secondary | ICD-10-CM

## 2015-01-12 DIAGNOSIS — Z23 Encounter for immunization: Secondary | ICD-10-CM | POA: Diagnosis not present

## 2015-03-05 ENCOUNTER — Ambulatory Visit
Admission: RE | Admit: 2015-03-05 | Discharge: 2015-03-05 | Disposition: A | Discharge status: Home / Self Care | Payer: Medicare Other | Source: Ambulatory Visit

## 2015-03-05 DIAGNOSIS — Z1231 Encounter for screening mammogram for malignant neoplasm of breast: Secondary | ICD-10-CM

## 2015-03-05 LAB — HM MAMMOGRAPHY

## 2015-03-23 ENCOUNTER — Encounter: Payer: Self-pay | Admitting: Family Medicine

## 2015-03-23 ENCOUNTER — Ambulatory Visit (INDEPENDENT_AMBULATORY_CARE_PROVIDER_SITE_OTHER): Payer: Medicare Other | Admitting: Family Medicine

## 2015-03-23 VITALS — BP 116/74 | HR 80 | Temp 97.9°F | Resp 16 | Ht 59.0 in | Wt 134.0 lb

## 2015-03-23 DIAGNOSIS — J302 Other seasonal allergic rhinitis: Secondary | ICD-10-CM | POA: Diagnosis not present

## 2015-03-23 DIAGNOSIS — R739 Hyperglycemia, unspecified: Secondary | ICD-10-CM

## 2015-03-23 DIAGNOSIS — E785 Hyperlipidemia, unspecified: Secondary | ICD-10-CM | POA: Diagnosis not present

## 2015-03-23 DIAGNOSIS — Z0000 Encounter for general adult medical examination without abnormal findings: Secondary | ICD-10-CM

## 2015-03-23 DIAGNOSIS — M1990 Unspecified osteoarthritis, unspecified site: Secondary | ICD-10-CM | POA: Diagnosis not present

## 2015-03-23 DIAGNOSIS — J45909 Unspecified asthma, uncomplicated: Secondary | ICD-10-CM

## 2015-03-23 MED ORDER — MONTELUKAST SODIUM 10 MG PO TABS: 10.0000 mg | ORAL_TABLET | Freq: Every day | ORAL | Status: DC

## 2015-03-23 MED ORDER — MELOXICAM 7.5 MG PO TABS: 7.5000 mg | ORAL_TABLET | Freq: Every day | ORAL | Status: DC

## 2015-03-23 MED ORDER — BUDESONIDE-FORMOTEROL FUMARATE 80-4.5 MCG/ACT IN AERO: 2.0000 | INHALATION_SPRAY | Freq: Two times a day (BID) | RESPIRATORY_TRACT | Status: DC

## 2015-03-23 NOTE — Progress Notes (Signed)
Patient ID: Cheryl Hays, female   DOB: [birth year], 69 y.o.   MRN: [id number]         Patient: Cheryl Hays, Female    DOB: 1945-12-01, 69 y.o.   MRN: [id number] Visit Date: 03/24/2015  Today's Provider: Margarita Rana, MD   Chief Complaint  Patient presents with  . Annual Exam   Subjective:    Annual wellness visit [name] is a 69 y.o. female. She feels well. She reports exercising regulary. She reports she is sleeping well.  Chronic problems stable.    -----------------------------------------------------------   Review of Systems  Social History   Social History  . Marital Status: Widowed    Spouse Name: N/A  . Number of Children: N/A  . Years of Education: N/A   Occupational History  . Not on file.   Social History Main Topics  . Smoking status: Former Smoker    Quit date: 05/16/1975  . Smokeless tobacco: Never Used  . Alcohol Use: 2.4 - 4.8 oz/week    4-8 Glasses of wine per week  . Drug Use: No  . Sexual Activity: Not on file   Other Topics Concern  . Not on file   Social History Narrative    Patient Active Problem List   Diagnosis Date Noted  . Asthma 10/31/2014  . Abnormal LFTs 10/31/2014  . Blood glucose elevated 10/31/2014  . HLD (hyperlipidemia) 10/31/2014  . OP (osteoporosis) 10/31/2014  . Allergic rhinitis, seasonal 10/31/2014    Past Surgical History  Procedure Laterality Date  . Facial cosmetic surgery  2006  . Breast surgery Left 2000    biopsy  . Dilation and curettage of uterus  1993  . Cesarean section  05/08/1978, 07/03/1979    Her family history includes Bladder Cancer in her brother; Heart murmur in her mother; Vision loss in her father.    Previous Medications   ASPIRIN 81 MG CHEWABLE TABLET    Chew 1 tablet (81 mg total) by mouth daily.   AZELASTINE HCL 0.15 % SOLN    Place 1 spray into the nose 2 (two) times daily.   CALCIUM CARB-CHOLECALCIFEROL (CALCIUM 600+D3) 600-800 MG-UNIT TABS    Take 1  tablet by mouth daily.    Patient Care Team: Margarita Rana, MD as PCP - General (Family Medicine)     Objective:   Vitals: BP 116/74 mmHg  Pulse 80  Temp(Src) 97.9 F (36.6 C) (Oral)  Resp 16  Ht 4\' 11"  (1.499 m)  Wt 134 lb (60.782 kg)  BMI 27.05 kg/m2  Physical Exam  Constitutional: She is oriented to person, place, and time. She appears well-developed and well-nourished.  HENT:  Head: Normocephalic and atraumatic.  Right Ear: Tympanic membrane, external ear and ear canal normal.  Left Ear: Tympanic membrane, external ear and ear canal normal.  Nose: Nose normal.  Mouth/Throat: Uvula is midline, oropharynx is clear and moist and mucous membranes are normal.  Eyes: Conjunctivae, EOM and lids are normal. Pupils are equal, round, and reactive to light. Lids are everted and swept, no foreign bodies found.  Neck: Trachea normal. Carotid bruit is not present.  Cardiovascular: Normal rate, regular rhythm, normal heart sounds and normal pulses.   Pulmonary/Chest: Effort normal and breath sounds normal.  Abdominal: Soft. Normal appearance, normal aorta and bowel sounds are normal. There is no tenderness.  Musculoskeletal: Normal range of motion.  Neurological: She is alert and oriented to person, place, and time.  Skin: Skin is warm, dry and intact.  Psychiatric: She has a normal mood and affect. Her speech is normal and behavior is normal. Judgment and thought content normal. Cognition and memory are normal.    Activities of Daily Living In your present state of health, do you have any difficulty performing the following activities: 03/23/2015  Hearing? N  Vision? N  Difficulty concentrating or making decisions? N  Walking or climbing stairs? N  Dressing or bathing? N  Doing errands, shopping? N    Fall Risk Assessment Fall Risk  03/23/2015  Falls in the past year? No     Depression Screen PHQ 2/9 Scores 03/23/2015  PHQ - 2 Score 0    Cognitive Testing - 6-CIT   Correct? Score   What year is it? yes 0 0 or 4  What month is it? yes 0 0 or 3  Memorize:    Pia Mau,  41,  [address]  [place],      What time is it? (within 1 hour) yes 0 0 or 3  Count backwards from 20 yes 0 0, 2, or 4  Name the months of the year yes 0 0, 2, or 4  Repeat name & address above yes 0 0, 2, 4, 6, 8, or 10       TOTAL SCORE  0/28   Interpretation:  Normal  Normal (0-7) Abnormal (8-28)       Assessment & Plan:     Annual Wellness Visit  Reviewed patient's Family Medical History Reviewed and updated list of patient's medical providers Assessment of cognitive impairment was done Assessed patient's functional ability Established a written schedule for health screening [place] Completed and Reviewed  Exercise Activities and Dietary recommendations Goals    None      Immunization History  Administered Date(s) Administered  . Pneumococcal Conjugate-13 04/16/2014  . Pneumococcal Polysaccharide-23 03/31/2008  . Tdap 06/03/2011  . Zoster 05/06/2009    Health Maintenance  Topic Date Due  . Hepatitis C Screening  [birth year]  . COLONOSCOPY  06/03/1994  . DEXA SCAN  06/03/2009  . PNA vac Low Risk Adult (2 of 2 - PPSV23) 04/17/2015  . INFLUENZA VACCINE  12/15/2015  . MAMMOGRAM  03/04/2017  . TETANUS/TDAP  06/02/2021  . ZOSTAVAX  Completed      Discussed health benefits of physical activity, and encouraged her to engage in regular exercise appropriate for her age and condition.    1. Medicare annual wellness visit, subsequent Stable as above.   2. HLD (hyperlipidemia) Stable. Check labs.   - Lipid panel - TSH  3. Blood glucose elevated Labs stable.  Check labs.  - Comprehensive metabolic panel - Hemoglobin A1c  4. Allergic rhinitis, seasonal Stable.  - CBC with Differential/Platelet  5. Arthritis Stable. Will continue Mobic.   - meloxicam (MOBIC) 7.5 MG tablet; Take 1 tablet (7.5 mg total) by mouth daily.   Dispense: 90 tablet; Refill: 1  6. Asthma, unspecified asthma severity, uncomplicated Will refill medication.  - montelukast (SINGULAIR) 10 MG tablet; Take 1 tablet (10 mg total) by mouth daily.  Dispense: 90 tablet; Refill: 1 - budesonide-formoterol (SYMBICORT) 80-4.5 MCG/ACT inhaler; Inhale 2 puffs into the lungs 2 (two) times daily.  Dispense: 1 Inhaler; Refill: 5  Patient was seen and examined by Jerrell Belfast, MD, and note scribed by Ashley Royalty, CMA.  I have reviewed the document for accuracy and completeness and I agree with above. Jerrell Belfast, MD   Margarita Rana, MD  ------------------------------------------------------------------------------------------------------------

## 2015-04-02 DIAGNOSIS — R739 Hyperglycemia, unspecified: Secondary | ICD-10-CM | POA: Diagnosis not present

## 2015-04-02 DIAGNOSIS — E785 Hyperlipidemia, unspecified: Secondary | ICD-10-CM | POA: Diagnosis not present

## 2015-04-02 DIAGNOSIS — J302 Other seasonal allergic rhinitis: Secondary | ICD-10-CM | POA: Diagnosis not present

## 2015-04-03 ENCOUNTER — Telehealth: Payer: Self-pay

## 2015-04-03 LAB — COMPREHENSIVE METABOLIC PANEL
ALBUMIN: 4.3 g/dL (ref 3.5–4.8)
ALT: 21 IU/L (ref 0–32)
AST: 23 IU/L (ref 0–40)
Albumin/Globulin Ratio: 1.8 (ref 1.1–2.5)
Alkaline Phosphatase: 90 IU/L (ref 39–117)
BUN/Creatinine Ratio: 22 (ref 11–26)
BUN: 16 mg/dL (ref 8–27)
Bilirubin Total: 0.6 mg/dL (ref 0.0–1.2)
CALCIUM: 9.5 mg/dL (ref 8.7–10.3)
CHLORIDE: 99 mmol/L (ref 97–106)
CO2: 28 mmol/L (ref 18–29)
Creatinine, Ser: 0.72 mg/dL (ref 0.57–1.00)
GFR calc Af Amer: 98 mL/min/{1.73_m2} (ref >59)
GFR calc non Af Amer: 85 mL/min/{1.73_m2} (ref >59)
GLOBULIN, TOTAL: 2.4 g/dL (ref 1.5–4.5)
Glucose: 95 mg/dL (ref 65–99)
POTASSIUM: 4.4 mmol/L (ref 3.5–5.2)
Sodium: 140 mmol/L (ref 136–144)
TOTAL PROTEIN: 6.7 g/dL (ref 6.0–8.5)

## 2015-04-03 LAB — CBC WITH DIFFERENTIAL/PLATELET
Basophils Absolute: 0 10*3/uL (ref 0.0–0.2)
Basos: 0 %
EOS (ABSOLUTE): 0.5 10*3/uL — ABNORMAL HIGH (ref 0.0–0.4)
EOS: 9 %
HEMATOCRIT: 42.3 % (ref 34.0–46.6)
HEMOGLOBIN: 14.4 g/dL (ref 11.1–15.9)
IMMATURE GRANULOCYTES: 0 %
Immature Grans (Abs): 0 10*3/uL (ref 0.0–0.1)
LYMPHS ABS: 2.2 10*3/uL (ref 0.7–3.1)
Lymphs: 41 %
MCH: 29.1 pg (ref 26.6–33.0)
MCHC: 34 g/dL (ref 31.5–35.7)
MCV: 86 fL (ref 79–97)
MONOCYTES: 9 %
Monocytes Absolute: 0.5 10*3/uL (ref 0.1–0.9)
Neutrophils Absolute: 2.2 10*3/uL (ref 1.4–7.0)
Neutrophils: 41 %
Platelets: 224 10*3/uL (ref 150–379)
RBC: 4.94 x10E6/uL (ref 3.77–5.28)
RDW: 14.8 % (ref 12.3–15.4)
WBC: 5.3 10*3/uL (ref 3.4–10.8)

## 2015-04-03 LAB — LIPID PANEL
CHOLESTEROL TOTAL: 227 mg/dL — AB (ref 100–199)
Chol/HDL Ratio: 3 ratio units (ref 0.0–4.4)
HDL: 76 mg/dL (ref >39)
LDL Calculated: 132 mg/dL — ABNORMAL HIGH (ref 0–99)
TRIGLYCERIDES: 94 mg/dL (ref 0–149)
VLDL CHOLESTEROL CAL: 19 mg/dL (ref 5–40)

## 2015-04-03 LAB — TSH: TSH: 3.1 u[IU]/mL (ref 0.450–4.500)

## 2015-04-03 LAB — HEMOGLOBIN A1C
Est. average glucose Bld gHb Est-mCnc: 120 mg/dL
Hgb A1c MFr Bld: 5.8 % — ABNORMAL HIGH (ref 4.8–5.6)

## 2015-04-03 NOTE — Telephone Encounter (Signed)
LMTCB 04/03/2015  Thanks,   -Mickel Baas

## 2015-04-03 NOTE — Telephone Encounter (Signed)
-----   Message from Margarita Rana, MD sent at 04/03/2015 10:09 AM EST ----- Labs stable. Cholesterol is elevated, only slightly secondary to very high good cholesterol. 10 year risk is 7.7 for heart disease, only slightly higher than if had normal cholesterol.  Can add medication if  Has strong family history or very concerned, or continue lifestyle changes and repeat annually . Thanks.

## 2015-04-08 NOTE — Telephone Encounter (Signed)
Patient advised as directed below. Patient expressed understanding. Per patient went to a cruise and think that's what affected her labs, prefers to continue her lifestyle changes because there is no family history of cholesterol. Will repeat labs in on year.  Thanks,  -Joseline

## 2015-12-07 DIAGNOSIS — H60339 Swimmer's ear, unspecified ear: Secondary | ICD-10-CM | POA: Diagnosis not present

## 2015-12-07 DIAGNOSIS — J209 Acute bronchitis, unspecified: Secondary | ICD-10-CM | POA: Diagnosis not present

## 2016-01-08 DIAGNOSIS — M25511 Pain in right shoulder: Secondary | ICD-10-CM | POA: Diagnosis not present

## 2016-01-08 DIAGNOSIS — M7541 Impingement syndrome of right shoulder: Secondary | ICD-10-CM | POA: Diagnosis not present

## 2016-01-25 DIAGNOSIS — Z85828 Personal history of other malignant neoplasm of skin: Secondary | ICD-10-CM | POA: Diagnosis not present

## 2016-01-25 DIAGNOSIS — L570 Actinic keratosis: Secondary | ICD-10-CM | POA: Diagnosis not present

## 2016-01-25 DIAGNOSIS — Z1283 Encounter for screening for malignant neoplasm of skin: Secondary | ICD-10-CM | POA: Diagnosis not present

## 2016-01-25 DIAGNOSIS — L821 Other seborrheic keratosis: Secondary | ICD-10-CM | POA: Diagnosis not present

## 2016-01-25 DIAGNOSIS — D229 Melanocytic nevi, unspecified: Secondary | ICD-10-CM | POA: Diagnosis not present

## 2016-01-25 DIAGNOSIS — L812 Freckles: Secondary | ICD-10-CM | POA: Diagnosis not present

## 2016-01-25 DIAGNOSIS — D485 Neoplasm of uncertain behavior of skin: Secondary | ICD-10-CM | POA: Diagnosis not present

## 2016-01-25 DIAGNOSIS — L578 Other skin changes due to chronic exposure to nonionizing radiation: Secondary | ICD-10-CM | POA: Diagnosis not present

## 2016-01-25 DIAGNOSIS — C44622 Squamous cell carcinoma of skin of right upper limb, including shoulder: Secondary | ICD-10-CM | POA: Diagnosis not present

## 2016-01-25 DIAGNOSIS — D1800 Hemangioma unspecified site: Secondary | ICD-10-CM | POA: Diagnosis not present

## 2016-01-25 DIAGNOSIS — L820 Inflamed seborrheic keratosis: Secondary | ICD-10-CM | POA: Diagnosis not present

## 2016-02-01 DIAGNOSIS — Z23 Encounter for immunization: Secondary | ICD-10-CM | POA: Diagnosis not present

## 2016-02-02 ENCOUNTER — Other Ambulatory Visit: Payer: Self-pay | Admitting: Physician Assistant

## 2016-02-02 DIAGNOSIS — Z1231 Encounter for screening mammogram for malignant neoplasm of breast: Secondary | ICD-10-CM

## 2016-02-08 DIAGNOSIS — M7541 Impingement syndrome of right shoulder: Secondary | ICD-10-CM | POA: Diagnosis not present

## 2016-03-09 DIAGNOSIS — C44622 Squamous cell carcinoma of skin of right upper limb, including shoulder: Secondary | ICD-10-CM | POA: Diagnosis not present

## 2016-03-16 ENCOUNTER — Ambulatory Visit
Admission: RE | Admit: 2016-03-16 | Discharge: 2016-03-16 | Disposition: A | Discharge status: Home / Self Care | Payer: Medicare Other | Source: Ambulatory Visit | Attending: Physician Assistant | Admitting: Physician Assistant

## 2016-03-16 DIAGNOSIS — Z1231 Encounter for screening mammogram for malignant neoplasm of breast: Secondary | ICD-10-CM | POA: Diagnosis not present

## 2016-03-17 ENCOUNTER — Telehealth: Payer: Self-pay

## 2016-03-17 NOTE — Telephone Encounter (Signed)
-----   Message from Mar Daring, PA-C sent at 03/17/2016 10:35 AM EDT ----- Normal mammogram. Repeat screening in one year.

## 2016-03-17 NOTE — Telephone Encounter (Signed)
LMTCB

## 2016-03-17 NOTE — Telephone Encounter (Signed)
Advised pt of her mammogram results.  Pt understands and will have repeat in 1 year.  Con Memos

## 2016-03-23 ENCOUNTER — Encounter: Payer: Self-pay | Admitting: Physician Assistant

## 2016-03-23 ENCOUNTER — Encounter: Payer: Self-pay | Admitting: Family Medicine

## 2016-03-23 ENCOUNTER — Ambulatory Visit (INDEPENDENT_AMBULATORY_CARE_PROVIDER_SITE_OTHER): Payer: Medicare Other | Admitting: Physician Assistant

## 2016-03-23 VITALS — BP 120/80 | HR 72 | Temp 98.3°F | Resp 16 | Ht 59.0 in | Wt 140.0 lb

## 2016-03-23 DIAGNOSIS — E7800 Pure hypercholesterolemia, unspecified: Secondary | ICD-10-CM | POA: Diagnosis not present

## 2016-03-23 DIAGNOSIS — R7989 Other specified abnormal findings of blood chemistry: Secondary | ICD-10-CM | POA: Diagnosis not present

## 2016-03-23 DIAGNOSIS — R945 Abnormal results of liver function studies: Secondary | ICD-10-CM

## 2016-03-23 DIAGNOSIS — Z23 Encounter for immunization: Secondary | ICD-10-CM

## 2016-03-23 DIAGNOSIS — R739 Hyperglycemia, unspecified: Secondary | ICD-10-CM

## 2016-03-23 DIAGNOSIS — Z0000 Encounter for general adult medical examination without abnormal findings: Secondary | ICD-10-CM

## 2016-03-23 DIAGNOSIS — Z1159 Encounter for screening for other viral diseases: Secondary | ICD-10-CM

## 2016-03-23 NOTE — Progress Notes (Signed)
Patient: Cheryl Hays, Female    DOB: 1945/08/13, 70 y.o.   MRN: [id number] Visit Date: 03/23/2016  Today's Provider: Mar Daring, PA-C   Chief Complaint  Patient presents with  . Medicare Wellness   Subjective:    Annual wellness visit Cheryl Hays is a 70 y.o. female. She feels well. She reports exercising 2 times per week. She reports she is sleeping well.  03/23/15 AWE 03/16/16 Mammogram-BI-RADS 1 05/07/14 BMD-osteoporosis; wants to repeat in 2018 instead of this year 08/03/11 Colonoscopy-Diverticulosis, recheck 5 yrs Flu vaccine: 02/01/16  She is leaving for [facility] on Saturday to stay for a month. She is building a home there for [place]. Her daughter lives in [place] currently. -----------------------------------------------------------   Review of Systems  Constitutional: Negative.   HENT: Negative.   Eyes: Negative.   Respiratory: Negative.   Cardiovascular: Negative.   Gastrointestinal: Negative.   Endocrine: Negative.   Genitourinary: Negative.   Musculoskeletal: Negative.   Skin: Negative.   Allergic/Immunologic: Negative.   Neurological: Negative.   Hematological: Negative.   Psychiatric/Behavioral: Negative.     Social History   Social History  . Marital status: Widowed    Spouse name: N/A  . Number of children: N/A  . Years of education: N/A   Occupational History  . Not on file.   Social History Main Topics  . Smoking status: Former Smoker    Quit date: 05/16/1975  . Smokeless tobacco: Never Used  . Alcohol use 1.2 oz/week    2 Glasses of wine per week  . Drug use: No  . Sexual activity: Not on file   Other Topics Concern  . Not on file   Social History Narrative  . No narrative on file    History reviewed. No pertinent past medical history.   Patient Active Problem List   Diagnosis Date Noted  . Asthma 10/31/2014  . Abnormal LFTs 10/31/2014  . Blood glucose elevated 10/31/2014  .  HLD (hyperlipidemia) 10/31/2014  . OP (osteoporosis) 10/31/2014  . Allergic rhinitis, seasonal 10/31/2014    Past Surgical History:  Procedure Laterality Date  . BREAST SURGERY Left 2000   biopsy  . CESAREAN SECTION  05/08/1978, 07/03/1979  . DILATION AND CURETTAGE OF UTERUS  1993  . FACIAL COSMETIC SURGERY  2006  . SKIN SURGERY Right    hand 02/2016 x's 2 Dr. Nehemiah Massed    Her family history includes Bladder Cancer in her brother; Heart murmur in her mother; Vision loss in her father.     Current Meds  Medication Sig  . aspirin 81 MG chewable tablet Chew 1 tablet (81 mg total) by mouth daily.  . Azelastine HCl 0.15 % SOLN Place 1 spray into the nose 2 (two) times daily.  . budesonide-formoterol (SYMBICORT) 80-4.5 MCG/ACT inhaler Inhale 2 puffs into the lungs 2 (two) times daily.  . Calcium Carb-Cholecalciferol (CALCIUM 600+D3) 600-800 MG-UNIT TABS Take 1 tablet by mouth daily.  . meloxicam (MOBIC) 7.5 MG tablet Take 1 tablet (7.5 mg total) by mouth daily.  . montelukast (SINGULAIR) 10 MG tablet Take 1 tablet (10 mg total) by mouth daily.    Patient Care Team: Mar Daring, PA-C as PCP - General (Family Medicine)     Objective:   Vitals: BP 120/80 (BP Location: Right Arm, Patient Position: Sitting, Cuff Size: Large)   Pulse 72   Temp 98.3 F (36.8 C) (Oral)   Resp 16   Ht 4\' 11"  (1.499 m)  Wt 140 lb (63.5 kg)   BMI 28.28 kg/m   Physical Exam  Constitutional: She is oriented to person, place, and time. She appears well-developed and well-nourished. No distress.  HENT:  Head: Normocephalic and atraumatic.  Right Ear: Tympanic membrane, external ear and ear canal normal.  Left Ear: Tympanic membrane, external ear and ear canal normal.  Nose: Nose normal.  Mouth/Throat: Uvula is midline, oropharynx is clear and moist and mucous membranes are normal. No oropharyngeal exudate.  Eyes: Conjunctivae and EOM are normal. Pupils are equal, round, and reactive to light.  Right eye exhibits no discharge. Left eye exhibits no discharge. No scleral icterus.  Neck: Trachea normal and normal range of motion. Neck supple. No JVD present. Carotid bruit is not present. No tracheal deviation present. No thyromegaly present.  Cardiovascular: Normal rate, regular rhythm, normal heart sounds and intact distal pulses.  Exam reveals no gallop and no friction rub.   No murmur heard. Pulmonary/Chest: Effort normal and breath sounds normal. No respiratory distress. She has no wheezes. She has no rales. She exhibits no tenderness.  Abdominal: Soft. Bowel sounds are normal. She exhibits no distension and no mass. There is no tenderness. There is no rebound and no guarding.  Musculoskeletal: Normal range of motion. She exhibits no edema or tenderness.  Lymphadenopathy:    She has no cervical adenopathy.  Neurological: She is alert and oriented to person, place, and time.  Skin: Skin is warm and dry. No rash noted. She is not diaphoretic.  Psychiatric: She has a normal mood and affect. Her behavior is normal. Judgment and thought content normal.  Vitals reviewed.   Activities of Daily Living In your present state of health, do you have any difficulty performing the following activities: 03/23/2016  Hearing? N  Vision? N  Difficulty concentrating or making decisions? N  Walking or climbing stairs? N  Dressing or bathing? N  Doing errands, shopping? N  Some recent data might be hidden    Fall Risk Assessment Fall Risk  03/23/2016 03/23/2015  Falls in the past year? No No     Depression Screen PHQ 2/9 Scores 03/23/2016 03/23/2015  PHQ - 2 Score 0 0    Cognitive Testing - 6-CIT  Correct? Score   What year is it? yes 0 0 or 4  What month is it? yes 0 0 or 3  Memorize:    Pia Mau,  41,  [address]  [place],      What time is it? (within 1 hour) yes 0 0 or 3  Count backwards from 20 yes 0 0, 2, or 4  Name the months of the year yes 0 0, 2, or 4  Repeat name & address  above yes 0 0, 2, 4, 6, 8, or 10       TOTAL SCORE  0/28   Interpretation:  Normal  Normal (0-7) Abnormal (8-28)    Audit-C Alcohol Use Screening  Question Answer Points  How often do you have alcoholic drink? never 0  On days you do drink alcohol, how many drinks do you typically consume? 0 0  How oftey will you drink 6 or more in a total? never 0  Total Score:  0   A score of 3 or more in women, and 4 or more in men indicates increased risk for alcohol abuse, EXCEPT if all of the points are from question 1.     Assessment & Plan:     Annual Wellness Visit  Reviewed patient's Family Medical History Reviewed and updated list of patient's medical providers Assessment of cognitive impairment was done Assessed patient's functional ability Established a written schedule for health screening [place] Completed and Reviewed  Exercise Activities and Dietary recommendations Goals    None      Immunization History  Administered Date(s) Administered  . Pneumococcal Conjugate-13 04/16/2014  . Pneumococcal Polysaccharide-23 03/31/2008  . Tdap 06/03/2011  . Zoster 05/06/2009    Health Maintenance  Topic Date Due  . Hepatitis C Screening  09[birth year]  . PNA vac Low Risk Adult (2 of 2 - PPSV23) 04/17/2015  . INFLUENZA VACCINE  12/15/2015  . MAMMOGRAM  03/16/2018  . TETANUS/TDAP  06/02/2021  . COLONOSCOPY  08/02/2021  . DEXA SCAN  Completed  . ZOSTAVAX  Completed     Discussed health benefits of physical activity, and encouraged her to engage in regular exercise appropriate for her age and condition.    1. Medicare annual wellness visit, subsequent Normal physical exam today.   2. Need for pneumococcal vaccination Pneumococcal 23 Vaccine given to patient without complications. Patient sat for 15 minutes after administration and was tolerated well without adverse effects. - Pneumococcal polysaccharide vaccine 23-valent greater than or equal to 2yo  subcutaneous/IM  3. Need for hepatitis C screening test - Hepatitis C Antibody  4. Abnormal LFTs Previously stable. Will check labs as below and f/u pending results. - Comprehensive Metabolic Panel (CMET)  5. Blood glucose elevated Previously stable. Will check labs as below and f/u pending results. - CBC w/Diff/Platelet - HgB A1c  6. Pure hypercholesterolemia Previously stable. Will check labs as below and f/u pending results. - Lipid Profile  ------------------------------------------------------------------------------------------------------------    Mar Daring, PA-C  [facility]

## 2016-03-23 NOTE — Patient Instructions (Signed)

## 2016-03-24 DIAGNOSIS — R739 Hyperglycemia, unspecified: Secondary | ICD-10-CM | POA: Diagnosis not present

## 2016-03-24 DIAGNOSIS — E7800 Pure hypercholesterolemia, unspecified: Secondary | ICD-10-CM | POA: Diagnosis not present

## 2016-03-24 DIAGNOSIS — R7989 Other specified abnormal findings of blood chemistry: Secondary | ICD-10-CM | POA: Diagnosis not present

## 2016-03-24 DIAGNOSIS — Z1159 Encounter for screening for other viral diseases: Secondary | ICD-10-CM | POA: Diagnosis not present

## 2016-03-25 ENCOUNTER — Telehealth: Payer: Self-pay

## 2016-03-25 LAB — LIPID PANEL
CHOL/HDL RATIO: 3.3 ratio (ref 0.0–4.4)
Cholesterol, Total: 214 mg/dL — ABNORMAL HIGH (ref 100–199)
HDL: 65 mg/dL (ref >39)
LDL Calculated: 131 mg/dL — ABNORMAL HIGH (ref 0–99)
TRIGLYCERIDES: 92 mg/dL (ref 0–149)
VLDL CHOLESTEROL CAL: 18 mg/dL (ref 5–40)

## 2016-03-25 LAB — COMPREHENSIVE METABOLIC PANEL
ALT: 23 IU/L (ref 0–32)
AST: 22 IU/L (ref 0–40)
Albumin/Globulin Ratio: 2.1 (ref 1.2–2.2)
Albumin: 4.2 g/dL (ref 3.5–4.8)
Alkaline Phosphatase: 82 IU/L (ref 39–117)
BUN/Creatinine Ratio: 21 (ref 12–28)
BUN: 15 mg/dL (ref 8–27)
Bilirubin Total: 0.6 mg/dL (ref 0.0–1.2)
CALCIUM: 9.1 mg/dL (ref 8.7–10.3)
CO2: 26 mmol/L (ref 18–29)
CREATININE: 0.71 mg/dL (ref 0.57–1.00)
Chloride: 100 mmol/L (ref 96–106)
GFR, EST AFRICAN AMERICAN: 99 mL/min/{1.73_m2} (ref >59)
GFR, EST NON AFRICAN AMERICAN: 86 mL/min/{1.73_m2} (ref >59)
GLUCOSE: 99 mg/dL (ref 65–99)
Globulin, Total: 2 g/dL (ref 1.5–4.5)
Potassium: 4.3 mmol/L (ref 3.5–5.2)
Sodium: 140 mmol/L (ref 134–144)
TOTAL PROTEIN: 6.2 g/dL (ref 6.0–8.5)

## 2016-03-25 LAB — HEMOGLOBIN A1C
Est. average glucose Bld gHb Est-mCnc: 105 mg/dL
HEMOGLOBIN A1C: 5.3 % (ref 4.8–5.6)

## 2016-03-25 LAB — CBC WITH DIFFERENTIAL/PLATELET
BASOS: 0 %
Basophils Absolute: 0 10*3/uL (ref 0.0–0.2)
EOS (ABSOLUTE): 0.3 10*3/uL (ref 0.0–0.4)
Eos: 3 %
Hematocrit: 39.8 % (ref 34.0–46.6)
Hemoglobin: 13.3 g/dL (ref 11.1–15.9)
IMMATURE GRANS (ABS): 0 10*3/uL (ref 0.0–0.1)
IMMATURE GRANULOCYTES: 0 %
LYMPHS: 26 %
Lymphocytes Absolute: 2 10*3/uL (ref 0.7–3.1)
MCH: 28.4 pg (ref 26.6–33.0)
MCHC: 33.4 g/dL (ref 31.5–35.7)
MCV: 85 fL (ref 79–97)
Monocytes Absolute: 0.7 10*3/uL (ref 0.1–0.9)
Monocytes: 9 %
NEUTROS PCT: 62 %
Neutrophils Absolute: 4.6 10*3/uL (ref 1.4–7.0)
PLATELETS: 231 10*3/uL (ref 150–379)
RBC: 4.68 x10E6/uL (ref 3.77–5.28)
RDW: 14.2 % (ref 12.3–15.4)
WBC: 7.5 10*3/uL (ref 3.4–10.8)

## 2016-03-25 LAB — HEPATITIS C ANTIBODY: Hep C Virus Ab: <0.1 s/co ratio (ref 0.0–0.9)

## 2016-03-25 NOTE — Telephone Encounter (Signed)
-----   Message from Mar Daring, PA-C sent at 03/25/2016 10:04 AM EST ----- Cholesterol and A1c improved from last year. All other labs stable. Continue lifestyle modifications.

## 2016-03-25 NOTE — Telephone Encounter (Signed)
Patient advised as below.  

## 2016-04-18 DIAGNOSIS — Z85828 Personal history of other malignant neoplasm of skin: Secondary | ICD-10-CM | POA: Diagnosis not present

## 2016-06-08 DIAGNOSIS — H2513 Age-related nuclear cataract, bilateral: Secondary | ICD-10-CM | POA: Diagnosis not present

## 2016-06-14 ENCOUNTER — Ambulatory Visit (INDEPENDENT_AMBULATORY_CARE_PROVIDER_SITE_OTHER): Payer: Medicare Other | Admitting: Family Medicine

## 2016-06-14 ENCOUNTER — Encounter: Payer: Self-pay | Admitting: Family Medicine

## 2016-06-14 VITALS — BP 134/78 | HR 95 | Temp 99.9°F | Resp 16 | Wt 142.6 lb

## 2016-06-14 DIAGNOSIS — J4521 Mild intermittent asthma with (acute) exacerbation: Secondary | ICD-10-CM | POA: Diagnosis not present

## 2016-06-14 DIAGNOSIS — J302 Other seasonal allergic rhinitis: Secondary | ICD-10-CM

## 2016-06-14 DIAGNOSIS — B9789 Other viral agents as the cause of diseases classified elsewhere: Secondary | ICD-10-CM | POA: Diagnosis not present

## 2016-06-14 DIAGNOSIS — J069 Acute upper respiratory infection, unspecified: Secondary | ICD-10-CM

## 2016-06-14 MED ORDER — ALBUTEROL SULFATE HFA 108 (90 BASE) MCG/ACT IN AERS: 2.0000 | INHALATION_SPRAY | Freq: Four times a day (QID) | RESPIRATORY_TRACT | 3 refills | Status: DC | PRN

## 2016-06-14 MED ORDER — BUDESONIDE-FORMOTEROL FUMARATE 80-4.5 MCG/ACT IN AERO: 2.0000 | INHALATION_SPRAY | Freq: Two times a day (BID) | RESPIRATORY_TRACT | 3 refills | Status: DC

## 2016-06-14 MED ORDER — AZELASTINE HCL 0.15 % NA SOLN: 1.0000 | Freq: Two times a day (BID) | NASAL | 3 refills | Status: DC

## 2016-06-14 MED ORDER — PREDNISONE 20 MG PO TABS: ORAL_TABLET | ORAL | 1 refills | Status: DC

## 2016-06-14 MED ORDER — AZITHROMYCIN 250 MG PO TABS: ORAL_TABLET | ORAL | 0 refills | Status: DC

## 2016-06-14 NOTE — Patient Instructions (Signed)
Schedule albuterol at least twice daily while ill and continue Symbicort. If breathing/cough worsens start prednisone and the antibiotic.

## 2016-06-14 NOTE — Progress Notes (Signed)
Subjective:     Patient ID: [place], female   DOB: November 22, 1945, 71 y.o.   MRN: [id number]  HPI  Chief Complaint  Patient presents with  . Cough    Patient comes in office today with concerns of productive cough for more than one week. Patient reports post nasal drip and wheezing in PM, patient has taken otc Mucinex for relief.   Reports chronic nocturnal sinus congestion. States she has been compliant with Symbicort but does not have an albuterol inhaler: "I don't feel sick". States she is going out of country in mid-February.   Review of Systems     Objective:   Physical Exam  Constitutional: She appears well-developed and well-nourished. No distress.  Ears: T.M's intact without inflammation Sinuses: non-tender Throat: no tonsillar enlargement or exudate Neck: no cervical adenopathy Lungs: posterior inspiratory and expiratory wheezes     Assessment:    1. Mild intermittent asthma with acute exacerbation - albuterol (PROVENTIL HFA;VENTOLIN HFA) 108 (90 Base) MCG/ACT inhaler; Inhale 2 puffs into the lungs every 6 (six) hours as needed for wheezing or shortness of breath.  Dispense: 1 Inhaler; Refill: 3 - budesonide-formoterol (SYMBICORT) 80-4.5 MCG/ACT inhaler; Inhale 2 puffs into the lungs 2 (two) times daily.  Dispense: 1 Inhaler; Refill: 3 - predniSONE (DELTASONE) 20 MG tablet; Take one pill twice daily for 5 days  Dispense: 24 tablet; Refill: 1  2. Viral upper respiratory tract infection - azithromycin (ZITHROMAX Z-PAK) 250 MG tablet; 2 pills the first day then one pill daily  Dispense: 6 each; Refill: 0  3. Chronic seasonal allergic rhinitis due to other allergen - Azelastine HCl 0.15 % SOLN; Place 1 spray into the nose 2 (two) times daily.  Dispense: 30 mL; Refill: 3    Plan:    Schedule albuterol. If breathing or cough worsens start prednisone and abx.

## 2016-06-20 ENCOUNTER — Other Ambulatory Visit: Payer: Self-pay | Admitting: Family Medicine

## 2016-06-20 ENCOUNTER — Telehealth: Payer: Self-pay

## 2016-06-20 DIAGNOSIS — J209 Acute bronchitis, unspecified: Secondary | ICD-10-CM

## 2016-06-20 DIAGNOSIS — J440 Chronic obstructive pulmonary disease with acute lower respiratory infection: Principal | ICD-10-CM

## 2016-06-20 MED ORDER — HYDROCODONE-HOMATROPINE 5-1.5 MG/5ML PO SYRP: ORAL_SOLUTION | ORAL | 0 refills | Status: DC

## 2016-06-20 NOTE — Telephone Encounter (Signed)
Patient called back office today to give Cheryl Hays  An update. Patient states that she only has two pills left of her z-pack and hery symptoms with coughing and wheezing or worse. Patient states that prednisone has been keeping her up at night and she had contacted her neighbor last night with complaints of cough and he gave her a teaspoon of codeine with guaifenesin. Patient denies fever or any other associated URI symptoms. Please advise. KW

## 2016-06-20 NOTE — Telephone Encounter (Signed)
Pt advised. Emily Drozdowski, CMA  

## 2016-06-20 NOTE — Telephone Encounter (Signed)
Prescription for cough syrup up front for pickup.

## 2016-06-22 ENCOUNTER — Ambulatory Visit (INDEPENDENT_AMBULATORY_CARE_PROVIDER_SITE_OTHER): Payer: Medicare Other | Admitting: Physician Assistant

## 2016-06-22 ENCOUNTER — Encounter: Payer: Self-pay | Admitting: Physician Assistant

## 2016-06-22 VITALS — BP 128/82 | HR 80 | Temp 98.1°F | Resp 16 | Wt 141.0 lb

## 2016-06-22 DIAGNOSIS — J4521 Mild intermittent asthma with (acute) exacerbation: Secondary | ICD-10-CM | POA: Diagnosis not present

## 2016-06-22 DIAGNOSIS — R062 Wheezing: Secondary | ICD-10-CM

## 2016-06-22 MED ORDER — DOXYCYCLINE HYCLATE 100 MG PO TABS: 100.0000 mg | ORAL_TABLET | Freq: Two times a day (BID) | ORAL | 0 refills | Status: AC

## 2016-06-22 MED ORDER — IPRATROPIUM-ALBUTEROL 0.5-2.5 (3) MG/3ML IN SOLN: 3.0000 mL | Freq: Once | RESPIRATORY_TRACT | Status: DC

## 2016-06-22 NOTE — Patient Instructions (Signed)
Asthma, Acute Bronchospasm °Acute bronchospasm caused by asthma is also referred to as an asthma attack. Bronchospasm means your air passages become narrowed. The narrowing is caused by inflammation and tightening of the muscles in the air tubes (bronchi) in your lungs. This can make it hard to breathe or cause you to wheeze and cough. °What are the causes? °Possible triggers are: °· Animal dander from the skin, hair, or feathers of animals. °· Dust mites contained in house dust. °· Cockroaches. °· Pollen from trees or grass. °· Mold. °· Cigarette or tobacco smoke. °· Air pollutants such as dust, household cleaners, hair sprays, aerosol sprays, paint fumes, strong chemicals, or strong odors. °· Cold air or weather changes. Cold air may trigger inflammation. Winds increase molds and pollens in the air. °· Strong emotions such as crying or laughing hard. °· Stress. °· Certain medicines such as aspirin or beta-blockers. °· Sulfites in foods and drinks, such as dried fruits and wine. °· Infections or inflammatory conditions, such as a flu, cold, or inflammation of the nasal membranes (rhinitis). °· Gastroesophageal reflux disease (GERD). GERD is a condition where stomach acid backs up into your esophagus. °· Exercise or strenuous activity. ° °What are the signs or symptoms? °· Wheezing. °· Excessive coughing, particularly at night. °· Chest tightness. °· Shortness of breath. °How is this diagnosed? °Your health care provider will ask you about your medical history and perform a physical exam. A chest X-ray or blood testing may be performed to look for other causes of your symptoms or other conditions that may have triggered your asthma attack. °How is this treated? °Treatment is aimed at reducing inflammation and opening up the airways in your lungs. Most asthma attacks are treated with inhaled medicines. These include quick relief or rescue medicines (such as bronchodilators) and controller medicines (such as inhaled  corticosteroids). These medicines are sometimes given through an inhaler or a nebulizer. Systemic steroid medicine taken by mouth or given through an IV tube also can be used to reduce the inflammation when an attack is moderate or severe. Antibiotic medicines are only used if a bacterial infection is present. °Follow these instructions at home: °· Rest. °· Drink plenty of liquids. This helps the mucus to remain thin and be easily coughed up. Only use caffeine in moderation and do not use alcohol until you have recovered from your illness. °· Do not smoke. Avoid being exposed to secondhand smoke. °· You play a critical role in keeping yourself in good health. Avoid exposure to things that cause you to wheeze or to have breathing problems. °· Keep your medicines up-to-date and available. Carefully follow your health care provider’s treatment plan. °· Take your medicine exactly as prescribed. °· When pollen or pollution is bad, keep windows closed and use an air conditioner or go to places with air conditioning. °· Asthma requires careful medical care. See your health care provider for a follow-up as advised. If you are more than [redacted] weeks pregnant and you were prescribed any new medicines, let your obstetrician know about the visit and how you are doing. Follow up with your health care provider as directed. °· After you have recovered from your asthma attack, make an appointment with your outpatient doctor to talk about ways to reduce the likelihood of future attacks. If you do not have a doctor who manages your asthma, make an appointment with a primary care doctor to discuss your asthma. °Get help right away if: °· You are getting worse. °·   You have trouble breathing. If severe, call your local emergency services (911 in the [place] °· You develop chest pain or discomfort. °· You are vomiting. °· You are not able to keep fluids down. °· You are coughing up yellow, green, brown, or bloody sputum. °· You have a fever  and your symptoms suddenly get worse. °· You have trouble swallowing. °This information is not intended to replace advice given to you by your health care provider. Make sure you discuss any questions you have with your health care provider. °Document Released: 08/17/2006 Document Revised: 10/14/2015 Document Reviewed: 11/07/2012 °Elsevier Interactive Patient Education © 2017 Elsevier Inc[employer] ° °

## 2016-06-22 NOTE — Progress Notes (Signed)
Patient: Cheryl Hays Female    DOB: 09-30-1945   71 y.o.   MRN: [id number] Visit Date: 06/22/2016  Today's Provider: Trinna Post, PA-C   Chief Complaint  Patient presents with  . Cough   Subjective:    Cough  This is a new problem. The problem has been unchanged. The cough is productive of sputum. Associated symptoms include postnasal drip, a rash, shortness of breath and wheezing. Pertinent negatives include no chills, ear congestion, ear pain, fever, headaches, heartburn, hemoptysis, nasal congestion, rhinorrhea, sore throat, sweats or weight loss. She has tried prescription cough suppressant, oral steroids, steroid inhaler and a beta-agonist inhaler for the symptoms. The treatment provided no relief. Her past medical history is significant for asthma.   Patient is a 71 y/o w/ history of mild asthma who was seen in the clinic on 1/30 for asthma exacerbation and was treated with 40 mg prednisone x 5 days, a Z-pack, albuterol and Symbicort. She has completed the prednisone course and the Z-pack. She is trying to avoid taking her albuterol inhaler because she doesn't like to takt it if she has to. She tried the codeine cough syrup which made her feel drunk so she hasn't taken that. She reports she feels better, but is still wheezing and coughing. No fevers, chills, N/V. She is going out of the country and is concerned that she won't be well enough to go.     Allergies  Allergen Reactions  . Celecoxib   . Levofloxacin Other (See Comments)    Muscle aches  . Sulfa Antibiotics   . Pseudoephedrine Rash     Current Outpatient Prescriptions:  .  albuterol (PROVENTIL HFA;VENTOLIN HFA) 108 (90 Base) MCG/ACT inhaler, Inhale 2 puffs into the lungs every 6 (six) hours as needed for wheezing or shortness of breath., Disp: 1 Inhaler, Rfl: 3 .  aspirin 81 MG chewable tablet, Chew 1 tablet (81 mg total) by mouth daily., Disp: 30 tablet, Rfl: 0 .  Azelastine HCl 0.15 % SOLN, Place 1  spray into the nose 2 (two) times daily., Disp: 30 mL, Rfl: 3 .  budesonide-formoterol (SYMBICORT) 80-4.5 MCG/ACT inhaler, Inhale 2 puffs into the lungs 2 (two) times daily., Disp: 1 Inhaler, Rfl: 3 .  Calcium Carb-Cholecalciferol (CALCIUM 600+D3) 600-800 MG-UNIT TABS, Take 1 tablet by mouth daily., Disp: , Rfl:  .  HYDROcodone-homatropine (HYCODAN) 5-1.5 MG/5ML syrup, 5 ml 4-6 hours as needed for cough, Disp: 240 mL, Rfl: 0 .  meloxicam (MOBIC) 7.5 MG tablet, Take 1 tablet (7.5 mg total) by mouth daily., Disp: 90 tablet, Rfl: 1 .  montelukast (SINGULAIR) 10 MG tablet, Take 1 tablet (10 mg total) by mouth daily., Disp: 90 tablet, Rfl: 1 .  predniSONE (DELTASONE) 20 MG tablet, Take one pill twice daily for 5 days, Disp: 24 tablet, Rfl: 1 .  azithromycin (ZITHROMAX Z-PAK) 250 MG tablet, 2 pills the first day then one pill daily (Patient not taking: Reported on 06/22/2016), Disp: 6 each, Rfl: 0  Review of Systems  Constitutional: Positive for fatigue. Negative for activity change, appetite change, chills, diaphoresis, fever, unexpected weight change and weight loss.  HENT: Positive for congestion, postnasal drip and trouble swallowing. Negative for ear discharge, ear pain, nosebleeds, rhinorrhea, sinus pain, sinus pressure, sneezing, sore throat and tinnitus.   Eyes: Negative.   Respiratory: Positive for cough, chest tightness, shortness of breath and wheezing. Negative for apnea, hemoptysis, choking and stridor.   Cardiovascular: Negative.   Gastrointestinal: Negative.  Negative for heartburn.  Skin: Positive for rash.  Neurological: Positive for light-headedness. Negative for dizziness and headaches.    Social History  Substance Use Topics  . Smoking status: Former Smoker    Quit date: 05/16/1975  . Smokeless tobacco: Never Used  . Alcohol use 1.2 oz/week    2 Glasses of wine per week   Objective:   BP 128/82 (BP Location: Left Arm, Patient Position: Sitting, Cuff Size: Normal)   Pulse 80    Temp 98.1 F (36.7 C) (Oral)   Resp 16   Wt 141 lb (64 kg)   SpO2 97%   BMI 28.48 kg/m   Physical Exam  Constitutional: She appears well-developed and well-nourished. No distress.  HENT:  Nose: Right sinus exhibits no maxillary sinus tenderness and no frontal sinus tenderness. Left sinus exhibits no maxillary sinus tenderness and no frontal sinus tenderness.  Mouth/Throat: Oropharynx is clear and moist. No oropharyngeal exudate.  Eyes: Conjunctivae are normal.  Neck: Neck supple.  Cardiovascular: Normal rate and regular rhythm.   Pulmonary/Chest: Effort normal. No respiratory distress. She has wheezes in the right upper field and the left upper field. She has no rales.  Lymphadenopathy:    She has no cervical adenopathy.  Skin: Skin is warm and dry.        Assessment & Plan:     1. Mild intermittent asthma with acute exacerbation  Gave duoneb in office with moderate resolution of wheezing upon re-examination. Encouraged patient to take albuterol inhaler every 6 hours scheduled even if she doesn't feel like she needs it for three days. Do not think she requires more steroids at this point. Have given a hard script for doxycycline to fill if she gets worse, though I have counseled patient that she has had z-pack which covers similar pathogens and is first line for pneumonia.  - ipratropium-albuterol (DUONEB) 0.5-2.5 (3) MG/3ML nebulizer solution 3 mL; Take 3 mLs by nebulization once. - doxycycline (VIBRA-TABS) 100 MG tablet; Take 1 tablet (100 mg total) by mouth 2 (two) times daily.  Dispense: 20 tablet; Refill: 0  2. Wheezing  - ipratropium-albuterol (DUONEB) 0.5-2.5 (3) MG/3ML nebulizer solution 3 mL; Take 3 mLs by nebulization once.  No Follow-up on file.  There are no Patient Instructions on file for this visit.  The entirety of the information documented in the History of Present Illness, Review of Systems and Physical Exam were personally obtained by me. Portions of this  information were initially documented by Ashley Royalty, CMA and reviewed by me for thoroughness and accuracy.        Trinna Post, PA-C  [facility]

## 2016-08-31 DIAGNOSIS — L821 Other seborrheic keratosis: Secondary | ICD-10-CM | POA: Diagnosis not present

## 2016-08-31 DIAGNOSIS — L820 Inflamed seborrheic keratosis: Secondary | ICD-10-CM | POA: Diagnosis not present

## 2016-08-31 DIAGNOSIS — L309 Dermatitis, unspecified: Secondary | ICD-10-CM | POA: Diagnosis not present

## 2016-08-31 DIAGNOSIS — L853 Xerosis cutis: Secondary | ICD-10-CM | POA: Diagnosis not present

## 2016-10-12 ENCOUNTER — Other Ambulatory Visit: Payer: Self-pay | Admitting: Family Medicine

## 2016-10-12 DIAGNOSIS — L309 Dermatitis, unspecified: Secondary | ICD-10-CM | POA: Diagnosis not present

## 2016-10-12 DIAGNOSIS — J4521 Mild intermittent asthma with (acute) exacerbation: Secondary | ICD-10-CM

## 2016-10-12 DIAGNOSIS — L299 Pruritus, unspecified: Secondary | ICD-10-CM | POA: Diagnosis not present

## 2016-11-08 DIAGNOSIS — L308 Other specified dermatitis: Secondary | ICD-10-CM | POA: Diagnosis not present

## 2016-11-08 DIAGNOSIS — C44622 Squamous cell carcinoma of skin of right upper limb, including shoulder: Secondary | ICD-10-CM | POA: Diagnosis not present

## 2016-11-08 DIAGNOSIS — L821 Other seborrheic keratosis: Secondary | ICD-10-CM | POA: Diagnosis not present

## 2016-11-08 DIAGNOSIS — L280 Lichen simplex chronicus: Secondary | ICD-10-CM | POA: Diagnosis not present

## 2016-11-08 DIAGNOSIS — D485 Neoplasm of uncertain behavior of skin: Secondary | ICD-10-CM | POA: Diagnosis not present

## 2016-11-08 DIAGNOSIS — L820 Inflamed seborrheic keratosis: Secondary | ICD-10-CM | POA: Diagnosis not present

## 2016-12-13 DIAGNOSIS — B86 Scabies: Secondary | ICD-10-CM | POA: Diagnosis not present

## 2016-12-13 DIAGNOSIS — L280 Lichen simplex chronicus: Secondary | ICD-10-CM | POA: Diagnosis not present

## 2016-12-22 DIAGNOSIS — H1132 Conjunctival hemorrhage, left eye: Secondary | ICD-10-CM | POA: Diagnosis not present

## 2016-12-27 DIAGNOSIS — L280 Lichen simplex chronicus: Secondary | ICD-10-CM | POA: Diagnosis not present

## 2016-12-27 DIAGNOSIS — B86 Scabies: Secondary | ICD-10-CM | POA: Diagnosis not present

## 2016-12-27 DIAGNOSIS — L281 Prurigo nodularis: Secondary | ICD-10-CM | POA: Diagnosis not present

## 2016-12-27 DIAGNOSIS — L821 Other seborrheic keratosis: Secondary | ICD-10-CM | POA: Diagnosis not present

## 2017-01-25 DIAGNOSIS — L821 Other seborrheic keratosis: Secondary | ICD-10-CM | POA: Diagnosis not present

## 2017-01-25 DIAGNOSIS — L570 Actinic keratosis: Secondary | ICD-10-CM | POA: Diagnosis not present

## 2017-01-25 DIAGNOSIS — L299 Pruritus, unspecified: Secondary | ICD-10-CM | POA: Diagnosis not present

## 2017-01-25 DIAGNOSIS — R21 Rash and other nonspecific skin eruption: Secondary | ICD-10-CM | POA: Diagnosis not present

## 2017-02-01 DIAGNOSIS — L820 Inflamed seborrheic keratosis: Secondary | ICD-10-CM | POA: Diagnosis not present

## 2017-02-01 DIAGNOSIS — L570 Actinic keratosis: Secondary | ICD-10-CM | POA: Diagnosis not present

## 2017-02-27 ENCOUNTER — Other Ambulatory Visit: Payer: Self-pay | Admitting: Physician Assistant

## 2017-02-27 DIAGNOSIS — Z1231 Encounter for screening mammogram for malignant neoplasm of breast: Secondary | ICD-10-CM

## 2017-03-01 DIAGNOSIS — Z23 Encounter for immunization: Secondary | ICD-10-CM | POA: Diagnosis not present

## 2017-03-23 ENCOUNTER — Telehealth: Payer: Self-pay

## 2017-03-23 ENCOUNTER — Ambulatory Visit
Admission: RE | Admit: 2017-03-23 | Discharge: 2017-03-23 | Disposition: A | Discharge status: Home / Self Care | Payer: Medicare Other | Source: Ambulatory Visit | Attending: Physician Assistant | Admitting: Physician Assistant

## 2017-03-23 DIAGNOSIS — L570 Actinic keratosis: Secondary | ICD-10-CM | POA: Diagnosis not present

## 2017-03-23 DIAGNOSIS — L578 Other skin changes due to chronic exposure to nonionizing radiation: Secondary | ICD-10-CM | POA: Diagnosis not present

## 2017-03-23 DIAGNOSIS — Z1231 Encounter for screening mammogram for malignant neoplasm of breast: Secondary | ICD-10-CM

## 2017-03-23 DIAGNOSIS — L812 Freckles: Secondary | ICD-10-CM | POA: Diagnosis not present

## 2017-03-23 DIAGNOSIS — D1800 Hemangioma unspecified site: Secondary | ICD-10-CM | POA: Diagnosis not present

## 2017-03-23 DIAGNOSIS — D692 Other nonthrombocytopenic purpura: Secondary | ICD-10-CM | POA: Diagnosis not present

## 2017-03-23 DIAGNOSIS — D485 Neoplasm of uncertain behavior of skin: Secondary | ICD-10-CM | POA: Diagnosis not present

## 2017-03-23 DIAGNOSIS — L853 Xerosis cutis: Secondary | ICD-10-CM | POA: Diagnosis not present

## 2017-03-23 DIAGNOSIS — Z1283 Encounter for screening for malignant neoplasm of skin: Secondary | ICD-10-CM | POA: Diagnosis not present

## 2017-03-23 DIAGNOSIS — L821 Other seborrheic keratosis: Secondary | ICD-10-CM | POA: Diagnosis not present

## 2017-03-23 DIAGNOSIS — Z85828 Personal history of other malignant neoplasm of skin: Secondary | ICD-10-CM | POA: Diagnosis not present

## 2017-03-23 DIAGNOSIS — L820 Inflamed seborrheic keratosis: Secondary | ICD-10-CM | POA: Diagnosis not present

## 2017-03-23 NOTE — Telephone Encounter (Signed)
-----   Message from Mar Daring, [place] sent at 03/23/2017  3:30 PM EST ----- Normal mammogram. Repeat screening in one year.

## 2017-03-23 NOTE — Telephone Encounter (Signed)
Patient advised as below.  

## 2017-03-24 ENCOUNTER — Ambulatory Visit: Payer: Self-pay

## 2017-03-24 ENCOUNTER — Ambulatory Visit: Payer: Self-pay | Admitting: Physician Assistant

## 2017-03-27 ENCOUNTER — Ambulatory Visit (INDEPENDENT_AMBULATORY_CARE_PROVIDER_SITE_OTHER): Payer: Medicare Other | Admitting: Family Medicine

## 2017-03-27 ENCOUNTER — Ambulatory Visit (INDEPENDENT_AMBULATORY_CARE_PROVIDER_SITE_OTHER): Payer: Medicare Other

## 2017-03-27 ENCOUNTER — Encounter: Payer: Self-pay | Admitting: Family Medicine

## 2017-03-27 VITALS — BP 136/74 | HR 84 | Temp 97.7°F | Resp 16 | Ht 59.0 in | Wt 140.8 lb

## 2017-03-27 VITALS — BP 146/72 | HR 80 | Temp 97.9°F | Ht 59.0 in | Wt 140.8 lb

## 2017-03-27 DIAGNOSIS — R7989 Other specified abnormal findings of blood chemistry: Secondary | ICD-10-CM

## 2017-03-27 DIAGNOSIS — Z1211 Encounter for screening for malignant neoplasm of colon: Secondary | ICD-10-CM | POA: Diagnosis not present

## 2017-03-27 DIAGNOSIS — R0683 Snoring: Secondary | ICD-10-CM | POA: Diagnosis not present

## 2017-03-27 DIAGNOSIS — R739 Hyperglycemia, unspecified: Secondary | ICD-10-CM | POA: Diagnosis not present

## 2017-03-27 DIAGNOSIS — R945 Abnormal results of liver function studies: Secondary | ICD-10-CM | POA: Diagnosis not present

## 2017-03-27 DIAGNOSIS — Z0000 Encounter for general adult medical examination without abnormal findings: Secondary | ICD-10-CM

## 2017-03-27 DIAGNOSIS — G4733 Obstructive sleep apnea (adult) (pediatric): Secondary | ICD-10-CM | POA: Insufficient documentation

## 2017-03-27 DIAGNOSIS — M810 Age-related osteoporosis without current pathological fracture: Secondary | ICD-10-CM | POA: Diagnosis not present

## 2017-03-27 DIAGNOSIS — E7800 Pure hypercholesterolemia, unspecified: Secondary | ICD-10-CM | POA: Diagnosis not present

## 2017-03-27 NOTE — Patient Instructions (Signed)
[name] , Thank you for taking time to come for your Medicare Wellness Visit. I appreciate your ongoing commitment to your health goals. Please review the following plan we discussed and let me know if I can assist you in the future.   Screening recommendations/referrals: Colonoscopy: Up to date Mammogram: Up to date Bone Density: Up to date Recommended yearly ophthalmology/optometry visit for glaucoma screening and checkup Recommended yearly dental visit for hygiene and checkup  Vaccinations: Influenza vaccine: completed Pneumococcal vaccine: completed series Tdap vaccine: up to date Shingles vaccine: completed in 2010  Advanced directives: Please bring a copy of your POA (Power of Homeworth) and/or Living Will to your next appointment.   Conditions/risks identified: Recommend increasing water intake to 4-6 glasses a day.   Next appointment: 2:00 PM today   Preventive Care 65 Years and Older, Female Preventive care refers to lifestyle choices and visits with your health care provider that can promote health and wellness. What does preventive care include?  A yearly physical exam. This is also called an annual well check.  Dental exams once or twice a year.  Routine eye exams. Ask your health care provider how often you should have your eyes checked.  Personal lifestyle choices, including:  Daily care of your teeth and gums.  Regular physical activity.  Eating a healthy diet.  Avoiding tobacco and drug use.  Limiting alcohol use.  Practicing safe sex.  Taking low-dose aspirin every day.  Taking vitamin and mineral supplements as recommended by your health care provider. What happens during an annual well check? The services and screenings done by your health care provider during your annual well check will depend on your age, overall health, lifestyle risk factors, and family history of disease. Counseling  Your health care provider may ask you questions about  your:  Alcohol use.  Tobacco use.  Drug use.  Emotional well-being.  Home and relationship well-being.  Sexual activity.  Eating habits.  History of falls.  Memory and ability to understand (cognition).  Work and work Statistician.  Reproductive health. Screening  You may have the following tests or measurements:  Height, weight, and BMI.  Blood pressure.  Lipid and cholesterol levels. These may be checked every 5 years, or more frequently if you are over 69 years old.  Skin check.  Lung cancer screening. You may have this screening every year starting at age 44 if you have a 30-pack-year history of smoking and currently smoke or have quit within the past 15 years.  Fecal occult blood test (FOBT) of the stool. You may have this test every year starting at age 50.  Flexible sigmoidoscopy or colonoscopy. You may have a sigmoidoscopy every 5 years or a colonoscopy every 10 years starting at age 40.  Hepatitis C blood test.  Hepatitis B blood test.  Sexually transmitted disease (STD) testing.  Diabetes screening. This is done by checking your blood sugar (glucose) after you have not eaten for a while (fasting). You may have this done every 1-3 years.  Bone density scan. This is done to screen for osteoporosis. You may have this done starting at age 82.  Mammogram. This may be done every 1-2 years. Talk to your health care provider about how often you should have regular mammograms. Talk with your health care provider about your test results, treatment options, and if necessary, the need for more tests. Vaccines  Your health care provider may recommend certain vaccines, such as:  Influenza vaccine. This is recommended  every year.  Tetanus, diphtheria, and acellular pertussis (Tdap, Td) vaccine. You may need a Td booster every 10 years.  Zoster vaccine. You may need this after age 50.  Pneumococcal 13-valent conjugate (PCV13) vaccine. One dose is recommended  after age 17.  Pneumococcal polysaccharide (PPSV23) vaccine. One dose is recommended after age 67. Talk to your health care provider about which screenings and vaccines you need and how often you need them. This information is not intended to replace advice given to you by your health care provider. Make sure you discuss any questions you have with your health care provider. Document Released: 05/29/2015 Document Revised: 01/20/2016 Document Reviewed: 03/03/2015 Elsevier Interactive Patient Education  2017 [place] Prevention in the Home Falls can cause injuries. They can happen to people of all ages. There are many things you can do to make your home safe and to help prevent falls. What can I do on the outside of my home?  Regularly fix the edges of walkways and driveways and fix any cracks.  Remove anything that might make you trip as you walk through a door, such as a raised step or threshold.  Trim any bushes or trees on the path to your home.  Use bright outdoor lighting.  Clear any walking paths of anything that might make someone trip, such as rocks or tools.  Regularly check to see if handrails are loose or broken. Make sure that both sides of any steps have handrails.  Any raised decks and porches should have guardrails on the edges.  Have any leaves, snow, or ice cleared regularly.  Use sand or salt on walking paths during winter.  Clean up any spills in your garage right away. This includes oil or grease spills. What can I do in the bathroom?  Use night lights.  Install grab bars by the toilet and in the tub and shower. Do not use towel bars as grab bars.  Use non-skid mats or decals in the tub or shower.  If you need to sit down in the shower, use a plastic, non-slip stool.  Keep the floor dry. Clean up any water that spills on the floor as soon as it happens.  Remove soap buildup in the tub or shower regularly.  Attach bath mats securely with  double-sided non-slip rug tape.  Do not have throw rugs and other things on the floor that can make you trip. What can I do in the bedroom?  Use night lights.  Make sure that you have a light by your bed that is easy to reach.  Do not use any sheets or blankets that are too big for your bed. They should not hang down onto the floor.  Have a firm chair that has side arms. You can use this for support while you get dressed.  Do not have throw rugs and other things on the floor that can make you trip. What can I do in the kitchen?  Clean up any spills right away.  Avoid walking on wet floors.  Keep items that you use a lot in easy-to-reach places.  If you need to reach something above you, use a strong step stool that has a grab bar.  Keep electrical cords out of the way.  Do not use floor polish or wax that makes floors slippery. If you must use wax, use non-skid floor wax.  Do not have throw rugs and other things on the floor that can make you trip. What  can I do with my stairs?  Do not leave any items on the stairs.  Make sure that there are handrails on both sides of the stairs and use them. Fix handrails that are broken or loose. Make sure that handrails are as long as the stairways.  Check any carpeting to make sure that it is firmly attached to the stairs. Fix any carpet that is loose or worn.  Avoid having throw rugs at the top or bottom of the stairs. If you do have throw rugs, attach them to the floor with carpet tape.  Make sure that you have a light switch at the top of the stairs and the bottom of the stairs. If you do not have them, ask someone to add them for you. What else can I do to help prevent falls?  Wear shoes that:  Do not have high heels.  Have rubber bottoms.  Are comfortable and fit you well.  Are closed at the toe. Do not wear sandals.  If you use a stepladder:  Make sure that it is fully opened. Do not climb a closed stepladder.  Make  sure that both sides of the stepladder are locked into place.  Ask someone to hold it for you, if possible.  Clearly mark and make sure that you can see:  Any grab bars or handrails.  First and last steps.  Where the edge of each step is.  Use tools that help you move around (mobility aids) if they are needed. These include:  Canes.  Walkers.  Scooters.  Crutches.  Turn on the lights when you go into a dark area. Replace any light bulbs as soon as they burn out.  Set up your furniture so you have a clear path. Avoid moving your furniture around.  If any of your floors are uneven, fix them.  If there are any pets around you, be aware of where they are.  Review your medicines with your doctor. Some medicines can make you feel dizzy. This can increase your chance of falling. Ask your doctor what other things that you can do to help prevent falls. This information is not intended to replace advice given to you by your health care provider. Make sure you discuss any questions you have with your health care provider. Document Released: 02/26/2009 Document Revised: 10/08/2015 Document Reviewed: 06/06/2014 Elsevier Interactive Patient Education  2017 [employer].

## 2017-03-27 NOTE — Assessment & Plan Note (Signed)
Doubt OSA, given no other symptoms She will ask her daughters if they have noticed other symptoms Continue to monitor

## 2017-03-27 NOTE — Assessment & Plan Note (Signed)
Recheck LFTs 

## 2017-03-27 NOTE — Assessment & Plan Note (Signed)
Recheck Lipid panel Not currently taking statin Calculate ASCVD risk and consider medications

## 2017-03-27 NOTE — Assessment & Plan Note (Signed)
Discussed with patient that new treatment options are available, hip fracture can lead to loss of independence and increased risk of morbidity/mortality Patient agrees to recheck DEXA If bone density worsens, we can discuss treatment options again

## 2017-03-27 NOTE — Progress Notes (Signed)
Patient: Cheryl Hays, Female    DOB: June 13, 1945, 71 y.o.   MRN: [id number] Visit Date: 03/27/2017  Today's Provider: Lavon Paganini, MD   Chief Complaint  Patient presents with  . Annual Exam   Subjective:    Annual physical exam Cheryl Hays is a 71 y.o. female who presents today for health maintenance and complete physical. She feels well. She reports exercising twice a week for 90 minutes. Goes to gym. She reports she is sleeping fairly well. Pt reports she snores, and has been told by her daughters that she should get a sleep study.  Last mammogram- 03/23/2017- BI-RADS 1 Last colonoscopy- 08/03/2011- diverticulosis. Repeat 5 years. Last BMD- 05/07/2014- osteoporosis. Refused medication. -----------------------------------------------------------------  Snoring - her daughters have complained that she snores nightly.  She does not have a bed partner, but has not been told that she has pauses in breathing - she feels well rested and refreshed after waking, no daytime HAs or drowsiness  Epworth Sleepiness Scale:  Sitting and reading: Slight chance of dozing Watching TV: Slight chance of dozing Sitting, inactive in a public place (e.g. a theatre or a meeting): Would never doze As a passenger in a car for an hour without a break: Would never doze Lying down to rest in the afternoon when circumstances permit: Slight chance of dozing Sitting and talking to someone: Would never doze Sitting quietly after a lunch without alcohol: Would never doze In a car, while stopped for a few minutes in traffic: Would never doze  Total score: 3    Review of Systems  Constitutional: Negative.   HENT: Negative.   Eyes: Negative.   Respiratory: Positive for wheezing. Negative for apnea, cough, choking, chest tightness, shortness of breath and stridor.   Cardiovascular: Negative.   Gastrointestinal: Negative.   Endocrine: Negative.   Genitourinary: Negative.     Musculoskeletal: Negative.   Skin: Positive for rash. Negative for color change, pallor and wound.  Allergic/Immunologic: Negative.   Neurological: Negative.   Hematological: Negative for adenopathy. Bruises/bleeds easily.  Psychiatric/Behavioral: Negative.     Social History      She  reports that she quit smoking about 41 years ago. she has never used smokeless tobacco. She reports that she drinks about 1.2 oz of alcohol per week. She reports that she does not use drugs.       Social History   Socioeconomic History  . Marital status: Widowed    Spouse name: None  . Number of children: 2  . Years of education: 42  . Highest education level: None  Social Needs  . Financial resource strain: None  . Food insecurity - worry: None  . Food insecurity - inability: None  . Transportation needs - medical: None  . Transportation needs - non-medical: None  Occupational History  . None  Tobacco Use  . Smoking status: Former Smoker    Last attempt to quit: 05/16/1975    Years since quitting: 41.8  . Smokeless tobacco: Never Used  Substance and Sexual Activity  . Alcohol use: Yes    Alcohol/week: 1.2 oz    Types: 2 Glasses of wine per week  . Drug use: No  . Sexual activity: None  Other Topics Concern  . None  Social History Narrative  . None    History reviewed. No pertinent past medical history.   Patient Active Problem List   Diagnosis Date Noted  . Snoring 03/27/2017  . Asthma 10/31/2014  .  Abnormal LFTs 10/31/2014  . Blood glucose elevated 10/31/2014  . HLD (hyperlipidemia) 10/31/2014  . OP (osteoporosis) 10/31/2014  . Allergic rhinitis, seasonal 10/31/2014    Past Surgical History:  Procedure Laterality Date  . BREAST SURGERY Left 2000   biopsy  . CESAREAN SECTION  05/08/1978, 07/03/1979  . DILATION AND CURETTAGE OF UTERUS  1993  . FACIAL COSMETIC SURGERY  2006  . SKIN SURGERY Right    hand 02/2016 x's 2 Dr. Nehemiah Massed    Family History        Family  Status  Relation Name Status  . Mother  Deceased at age 13  . Father  Deceased  . Sister  Alive  . Brother  Deceased  . Sister  Alive        Her family history includes Bladder Cancer in her brother; Heart murmur in her mother; Vision loss in her father.     Allergies  Allergen Reactions  . Celecoxib   . Levofloxacin Other (See Comments)    Muscle aches  . Sulfa Antibiotics   . Pseudoephedrine Rash     Current Outpatient Medications:  .  albuterol (PROVENTIL HFA;VENTOLIN HFA) 108 (90 Base) MCG/ACT inhaler, Inhale 2 puffs into the lungs every 6 (six) hours as needed for wheezing or shortness of breath., Disp: 1 Inhaler, Rfl: 3 .  aspirin 81 MG chewable tablet, Chew 1 tablet (81 mg total) by mouth daily. (Patient taking differently: Chew 81 mg daily by mouth. ), Disp: 30 tablet, Rfl: 0 .  Azelastine HCl 0.15 % SOLN, Place 1 spray into the nose 2 (two) times daily. (Patient taking differently: Place 1 spray daily into the nose. ), Disp: 30 mL, Rfl: 3 .  Calcium Carb-Cholecalciferol (CALCIUM 600+D3) 600-800 MG-UNIT TABS, Take 1 tablet by mouth daily., Disp: , Rfl:  .  FLUZONE HIGH-DOSE 0.5 ML injection, ADM 0.5ML IM UTD, Disp: , Rfl: 0 .  meloxicam (MOBIC) 7.5 MG tablet, Take 1 tablet (7.5 mg total) by mouth daily. (Patient not taking: Reported on 03/27/2017), Disp: 90 tablet, Rfl: 1 .  montelukast (SINGULAIR) 10 MG tablet, Take 1 tablet (10 mg total) by mouth daily. (Patient not taking: Reported on 03/27/2017), Disp: 90 tablet, Rfl: 1 .  SYMBICORT 80-4.5 MCG/ACT inhaler, INHALE 2 PUFFS INTO THE LUNGS TWICE DAILY, Disp: 10.2 g, Rfl: 5 .  triamcinolone cream (KENALOG) 0.1 %, Apply 1 application 2 (two) times daily topically. , Disp: , Rfl: 0   Patient Care Team: Virginia Crews, MD as PCP - General (Family Medicine) Mar Daring, PA-C as Physician Assistant (Family Medicine) Ralene Bathe, MD as Consulting Physician (Dermatology) Eulogio Bear, MD as Consulting  Physician (Ophthalmology)      Objective:   Vitals: BP 136/74 (BP Location: Left Arm, Patient Position: Sitting, Cuff Size: Normal)   Pulse 84   Temp 97.7 F (36.5 C) (Oral)   Resp 16   Ht 4\' 11"  (1.499 m)   Wt 140 lb 12.8 oz (63.9 kg)   SpO2 99%   BMI 28.44 kg/m    Vitals:   03/27/17 1408  BP: 136/74  Pulse: 84  Resp: 16  Temp: 97.7 F (36.5 C)  TempSrc: Oral  SpO2: 99%  Weight: 140 lb 12.8 oz (63.9 kg)  Height: 4\' 11"  (1.499 m)     Physical Exam  Constitutional: She is oriented to person, place, and time. She appears well-developed and well-nourished. No distress.  HENT:  Head: Normocephalic and atraumatic.  Right Ear: External ear  normal.  Left Ear: External ear normal.  Nose: Nose normal.  Mouth/Throat: Oropharynx is clear and moist.  Eyes: Conjunctivae and EOM are normal. Pupils are equal, round, and reactive to light. No scleral icterus.  Neck: Neck supple. No thyromegaly present.  Cardiovascular: Normal rate, regular rhythm, normal heart sounds and intact distal pulses.  No murmur heard. Pulmonary/Chest: Effort normal and breath sounds normal. No respiratory distress. She has no wheezes. She has no rales.  Abdominal: Soft. Bowel sounds are normal. She exhibits no distension. There is no tenderness. There is no rebound and no guarding.  Musculoskeletal: She exhibits no edema or deformity.  Lymphadenopathy:    She has no cervical adenopathy.  Neurological: She is alert and oriented to person, place, and time.  Skin: Skin is warm and dry. No rash noted.  Psychiatric: She has a normal mood and affect. Her behavior is normal.  Vitals reviewed.    Depression Screen PHQ 2/9 Scores 03/27/2017 03/27/2017 03/23/2016 03/23/2015  PHQ - 2 Score 0 0 0 0  PHQ- 9 Score 0 - - -      Assessment & Plan:     Routine Health Maintenance and Physical Exam  Exercise Activities and Dietary recommendations Goals    None      Immunization History  Administered  Date(s) Administered  . Influenza-Unspecified 02/01/2016  . Pneumococcal Conjugate-13 04/16/2014  . Pneumococcal Polysaccharide-23 03/31/2008, 03/23/2016  . Tdap 06/03/2011  . Zoster 05/06/2009    Health Maintenance  Topic Date Due  . MAMMOGRAM  03/24/2019  . TETANUS/TDAP  06/02/2021  . COLONOSCOPY  08/02/2021  . INFLUENZA VACCINE  Completed  . DEXA SCAN  Completed  . Hepatitis C Screening  Completed  . PNA vac Low Risk Adult  Completed     Discussed health benefits of physical activity, and encouraged her to engage in regular exercise appropriate for her age and condition.    Problem List Items Addressed This Visit      Musculoskeletal and Integument   OP (osteoporosis) - Primary    Discussed with patient that new treatment options are available, hip fracture can lead to loss of independence and increased risk of morbidity/mortality Patient agrees to recheck DEXA If bone density worsens, we can discuss treatment options again      Relevant Orders   Comprehensive metabolic panel   DG Bone Density     Other   Abnormal LFTs    Recheck LFTs      Relevant Orders   Comprehensive metabolic panel   Blood glucose elevated    Previously elevated, but well controlled on last check Recheck A1c      Relevant Orders   Hemoglobin A1c   HLD (hyperlipidemia)    Recheck Lipid panel Not currently taking statin Calculate ASCVD risk and consider medications      Relevant Orders   Comprehensive metabolic panel   Lipid panel   Snoring    Doubt OSA, given no other symptoms She will ask her daughters if they have noticed other symptoms Continue to monitor       Other Visit Diagnoses    Healthcare maintenance       Relevant Orders   Comprehensive metabolic panel   Lipid panel   CBC   Hemoglobin A1c   DG Bone Density   Cologuard   Screening for colon cancer       Relevant Orders   Cologuard       -------------------------------------------------------------------- Return in about 1 year (around 03/27/2018) for  CPE.  The entirety of the information documented in the History of Present Illness, Review of Systems and Physical Exam were personally obtained by me. Portions of this information were initially documented by Raquel Sarna Ratchford, CMA and reviewed by me for thoroughness and accuracy.     Lavon Paganini, MD  [facility]

## 2017-03-27 NOTE — Progress Notes (Signed)
Subjective:   Cheryl Hays is a 71 y.o. female who presents for Medicare Annual (Subsequent) preventive examination.  Review of Systems:  N/A   Cardiac Risk Factors include: advanced age (>35men, >26 women);dyslipidemia;hypertension     Objective:     Vitals: BP (!) 146/72 (BP Location: Left Arm)   Pulse 80   Temp 97.9 F (36.6 C) (Oral)   Ht 4\' 11"  (1.499 m)   Wt 140 lb 12.8 oz (63.9 kg)   BMI 28.44 kg/m   Body mass index is 28.44 kg/m.   Tobacco Social History   Tobacco Use  Smoking Status Former Smoker  . Last attempt to quit: 05/16/1975  . Years since quitting: 41.8  Smokeless Tobacco Never Used     Counseling given: Not Answered   History reviewed. No pertinent past medical history. Past Surgical History:  Procedure Laterality Date  . BREAST SURGERY Left 2000   biopsy  . CESAREAN SECTION  05/08/1978, 07/03/1979  . DILATION AND CURETTAGE OF UTERUS  1993  . FACIAL COSMETIC SURGERY  2006  . SKIN SURGERY Right    hand 02/2016 x's 2 Dr. Nehemiah Massed   Family History  Problem Relation Age of Onset  . Heart murmur Mother   . Vision loss Father   . Bladder Cancer Brother    Social History   Substance and Sexual Activity  Sexual Activity Not on file    Outpatient Encounter Medications as of 03/27/2017  Medication Sig  . albuterol (PROVENTIL HFA;VENTOLIN HFA) 108 (90 Base) MCG/ACT inhaler Inhale 2 puffs into the lungs every 6 (six) hours as needed for wheezing or shortness of breath.  Marland Kitchen aspirin 81 MG chewable tablet Chew 1 tablet (81 mg total) by mouth daily. (Patient taking differently: Chew 81 mg daily by mouth. )  . Azelastine HCl 0.15 % SOLN Place 1 spray into the nose 2 (two) times daily. (Patient taking differently: Place 1 spray daily into the nose. )  . Calcium Carb-Cholecalciferol (CALCIUM 600+D3) 600-800 MG-UNIT TABS Take 1 tablet by mouth daily.  Marland Kitchen FLUZONE HIGH-DOSE 0.5 ML injection ADM 0.5ML IM UTD  . SYMBICORT 80-4.5 MCG/ACT inhaler  INHALE 2 PUFFS INTO THE LUNGS TWICE DAILY  . triamcinolone cream (KENALOG) 0.1 % Apply 1 application 2 (two) times daily topically.   . meloxicam (MOBIC) 7.5 MG tablet Take 1 tablet (7.5 mg total) by mouth daily. (Patient not taking: Reported on 03/27/2017)  . montelukast (SINGULAIR) 10 MG tablet Take 1 tablet (10 mg total) by mouth daily. (Patient not taking: Reported on 03/27/2017)  . [DISCONTINUED] azithromycin (ZITHROMAX Z-PAK) 250 MG tablet 2 pills the first day then one pill daily (Patient not taking: Reported on 06/22/2016)  . [DISCONTINUED] HYDROcodone-homatropine (HYCODAN) 5-1.5 MG/5ML syrup 5 ml 4-6 hours as needed for cough  . [DISCONTINUED] predniSONE (DELTASONE) 20 MG tablet Take one pill twice daily for 5 days  . [DISCONTINUED] ipratropium-albuterol (DUONEB) 0.5-2.5 (3) MG/3ML nebulizer solution 3 mL    No facility-administered encounter medications on file as of 03/27/2017.     Activities of Daily Living In your present state of health, do you have any difficulty performing the following activities: 03/27/2017  Hearing? N  Vision? N  Difficulty concentrating or making decisions? N  Walking or climbing stairs? N  Dressing or bathing? N  Doing errands, shopping? N  Preparing Food and eating ? N  Using the Toilet? N  In the past six months, have you accidently leaked urine? N  Do you have problems with  loss of bowel control? N  Managing your Medications? N  Managing your Finances? N  Housekeeping or managing your Housekeeping? N  Some recent data might be hidden    Patient Care Team: Virginia Crews, MD as PCP - General (Family Medicine) Marlyn Corporal Clearnce Sorrel, PA-C as Physician Assistant (Family Medicine) Ralene Bathe, MD as Consulting Physician (Dermatology) Eulogio Bear, MD as Consulting Physician (Ophthalmology)    Assessment:     Exercise Activities and Dietary recommendations Current Exercise Habits: Structured exercise class, Type of exercise:  strength training/weights;stretching;walking, Time (Minutes): > 60(1.5 hour), Frequency (Times/Week): 2, Weekly Exercise (Minutes/Week): 0, Intensity: Mild, Exercise limited by: None identified  Goals    None     Fall Risk Fall Risk  03/27/2017 03/23/2016 03/23/2015  Falls in the past year? No No No   Depression Screen PHQ 2/9 Scores 03/27/2017 03/27/2017 03/23/2016 03/23/2015  PHQ - 2 Score 0 0 0 0  PHQ- 9 Score 0 - - -     Cognitive Function: Pt declined screening today.        Immunization History  Administered Date(s) Administered  . Influenza-Unspecified 02/01/2016  . Pneumococcal Conjugate-13 04/16/2014  . Pneumococcal Polysaccharide-23 03/31/2008, 03/23/2016  . Tdap 06/03/2011  . Zoster 05/06/2009   Screening Tests Health Maintenance  Topic Date Due  . MAMMOGRAM  03/24/2019  . TETANUS/TDAP  06/02/2021  . COLONOSCOPY  08/02/2021  . INFLUENZA VACCINE  Completed  . DEXA SCAN  Completed  . Hepatitis C Screening  Completed  . PNA vac Low Risk Adult  Completed      Plan:  I have personally reviewed and addressed the Medicare Annual Wellness questionnaire and have noted the following in the patient's chart:  A. Medical and social history B. Use of alcohol, tobacco or illicit drugs  C. Current medications and supplements D. Functional ability and status E.  Nutritional status F.  Physical activity G. Advance directives H. List of other physicians I.  Hospitalizations, surgeries, and ER visits in previous 12 months J.  [place] such as hearing and vision if needed, cognitive and depression L. Referrals and appointments - none  In addition, I have reviewed and discussed with patient certain preventive protocols, quality metrics, and best practice recommendations. A written personalized care plan for preventive services as well as general preventive health recommendations were provided to patient.  See attached scanned questionnaire for additional  information.   Signed,  Fabio Neighbors, LPN Nurse Health Advisor   MD Recommendations: None.

## 2017-03-27 NOTE — Patient Instructions (Signed)
Preventive Care 65 Years and Older, Female Preventive care refers to lifestyle choices and visits with your health care provider that can promote health and wellness. What does preventive care include?  A yearly physical exam. This is also called an annual well check.  Dental exams once or twice a year.  Routine eye exams. Ask your health care provider how often you should have your eyes checked.  Personal lifestyle choices, including: ? Daily care of your teeth and gums. ? Regular physical activity. ? Eating a healthy diet. ? Avoiding tobacco and drug use. ? Limiting alcohol use. ? Practicing safe sex. ? Taking low-dose aspirin every day. ? Taking vitamin and mineral supplements as recommended by your health care provider. What happens during an annual well check? The services and screenings done by your health care provider during your annual well check will depend on your age, overall health, lifestyle risk factors, and family history of disease. Counseling Your health care provider may ask you questions about your:  Alcohol use.  Tobacco use.  Drug use.  Emotional well-being.  Home and relationship well-being.  Sexual activity.  Eating habits.  History of falls.  Memory and ability to understand (cognition).  Work and work environment.  Reproductive health.  Screening You may have the following tests or measurements:  Height, weight, and BMI.  Blood pressure.  Lipid and cholesterol levels. These may be checked every 5 years, or more frequently if you are over 50 years old.  Skin check.  Lung cancer screening. You may have this screening every year starting at age 55 if you have a 30-pack-year history of smoking and currently smoke or have quit within the past 15 years.  Fecal occult blood test (FOBT) of the stool. You may have this test every year starting at age 50.  Flexible sigmoidoscopy or colonoscopy. You may have a sigmoidoscopy every 5 years or  a colonoscopy every 10 years starting at age 50.  Hepatitis C blood test.  Hepatitis B blood test.  Sexually transmitted disease (STD) testing.  Diabetes screening. This is done by checking your blood sugar (glucose) after you have not eaten for a while (fasting). You may have this done every 1-3 years.  Bone density scan. This is done to screen for osteoporosis. You may have this done starting at age 72.  Mammogram. This may be done every 1-2 years. Talk to your health care provider about how often you should have regular mammograms.  Talk with your health care provider about your test results, treatment options, and if necessary, the need for more tests. Vaccines Your health care provider may recommend certain vaccines, such as:  Influenza vaccine. This is recommended every year.  Tetanus, diphtheria, and acellular pertussis (Tdap, Td) vaccine. You may need a Td booster every 10 years.  Varicella vaccine. You may need this if you have not been vaccinated.  Zoster vaccine. You may need this after age 60.  Measles, mumps, and rubella (MMR) vaccine. You may need at least one dose of MMR if you were born in 1957 or later. You may also need a second dose.  Pneumococcal 13-valent conjugate (PCV13) vaccine. One dose is recommended after age 72.  Pneumococcal polysaccharide (PPSV23) vaccine. One dose is recommended after age 72.  Meningococcal vaccine. You may need this if you have certain conditions.  Hepatitis A vaccine. You may need this if you have certain conditions or if you travel or work in places where you may be exposed to hepatitis   A.  Hepatitis B vaccine. You may need this if you have certain conditions or if you travel or work in places where you may be exposed to hepatitis B.  Haemophilus influenzae type b (Hib) vaccine. You may need this if you have certain conditions.  Talk to your health care provider about which screenings and vaccines you need and how often you  need them. This information is not intended to replace advice given to you by your health care provider. Make sure you discuss any questions you have with your health care provider. Document Released: 05/29/2015 Document Revised: 01/20/2016 Document Reviewed: 03/03/2015 Elsevier Interactive Patient Education  2017 [employer].

## 2017-03-27 NOTE — Assessment & Plan Note (Signed)
Previously elevated, but well controlled on last check Recheck A1c

## 2017-03-28 DIAGNOSIS — M810 Age-related osteoporosis without current pathological fracture: Secondary | ICD-10-CM | POA: Diagnosis not present

## 2017-03-28 DIAGNOSIS — E7800 Pure hypercholesterolemia, unspecified: Secondary | ICD-10-CM | POA: Diagnosis not present

## 2017-03-28 DIAGNOSIS — Z0000 Encounter for general adult medical examination without abnormal findings: Secondary | ICD-10-CM | POA: Diagnosis not present

## 2017-03-28 DIAGNOSIS — R945 Abnormal results of liver function studies: Secondary | ICD-10-CM | POA: Diagnosis not present

## 2017-03-28 DIAGNOSIS — R739 Hyperglycemia, unspecified: Secondary | ICD-10-CM | POA: Diagnosis not present

## 2017-03-29 ENCOUNTER — Telehealth: Payer: Self-pay

## 2017-03-29 LAB — CBC
HCT: 41.4 % (ref 35.0–45.0)
Hemoglobin: 14.1 g/dL (ref 11.7–15.5)
MCH: 28.8 pg (ref 27.0–33.0)
MCHC: 34.1 g/dL (ref 32.0–36.0)
MCV: 84.7 fL (ref 80.0–100.0)
MPV: 10 fL (ref 7.5–12.5)
PLATELETS: 229 10*3/uL (ref 140–400)
RBC: 4.89 10*6/uL (ref 3.80–5.10)
RDW: 13.2 % (ref 11.0–15.0)
WBC: 6.2 10*3/uL (ref 3.8–10.8)

## 2017-03-29 LAB — COMPLETE METABOLIC PANEL WITH GFR
AG RATIO: 1.5 (calc) (ref 1.0–2.5)
ALT: 17 U/L (ref 6–29)
AST: 19 U/L (ref 10–35)
Albumin: 4.1 g/dL (ref 3.6–5.1)
Alkaline phosphatase (APISO): 80 U/L (ref 33–130)
BUN: 16 mg/dL (ref 7–25)
CALCIUM: 9.3 mg/dL (ref 8.6–10.4)
CO2: 30 mmol/L (ref 20–32)
Chloride: 103 mmol/L (ref 98–110)
Creat: 0.65 mg/dL (ref 0.60–0.93)
GFR, EST NON AFRICAN AMERICAN: 89 mL/min/{1.73_m2} (ref >=60)
GFR, Est African American: 103 mL/min/{1.73_m2} (ref >=60)
GLUCOSE: 96 mg/dL (ref 65–99)
Globulin: 2.7 g/dL (calc) (ref 1.9–3.7)
POTASSIUM: 4.2 mmol/L (ref 3.5–5.3)
Sodium: 139 mmol/L (ref 135–146)
Total Bilirubin: 0.6 mg/dL (ref 0.2–1.2)
Total Protein: 6.8 g/dL (ref 6.1–8.1)

## 2017-03-29 LAB — LIPID PANEL
CHOL/HDL RATIO: 2.8 (calc) (ref <5.0)
Cholesterol: 235 mg/dL — ABNORMAL HIGH (ref <200)
HDL: 85 mg/dL (ref >50)
LDL Cholesterol (Calc): 131 mg/dL (calc) — ABNORMAL HIGH
NON-HDL CHOLESTEROL (CALC): 150 mg/dL — AB (ref <130)
Triglycerides: 87 mg/dL (ref <150)

## 2017-03-29 LAB — HEMOGLOBIN A1C
Hgb A1c MFr Bld: 5.5 %{Hb}
Mean Plasma Glucose: 111 (calc)
eAG (mmol/L): 6.2 (calc)

## 2017-03-29 NOTE — Telephone Encounter (Signed)
lmtcb

## 2017-03-29 NOTE — Telephone Encounter (Signed)
-----   Message from Virginia Crews, MD sent at 03/29/2017 11:38 AM EST ----- Normal kidney function, liver function, electrolytes, Blood counts, A1c (no diabetes). Cholesterol is stable from last year.  10 year risk of heart disease/stroke is high at 12.6%.  I would recommend medication for lowering cholesterol and heart disease risk (recommend this at risk >7.5%).  If patient agreeable, recommend Atorvastatin 20mg  daily (Ok to Rx #30 r5).  Would f/u in 3 months and recheck some labs.  Virginia Crews, MD, MPH [facility] 03/29/2017 11:37 AM

## 2017-03-30 MED ORDER — ATORVASTATIN CALCIUM 20 MG PO TABS: 20.0000 mg | ORAL_TABLET | Freq: Every day | ORAL | 5 refills | Status: DC

## 2017-03-30 NOTE — Telephone Encounter (Signed)
Pt advised and agrees with med. Sent med in.

## 2017-04-17 DIAGNOSIS — L03116 Cellulitis of left lower limb: Secondary | ICD-10-CM | POA: Diagnosis not present

## 2017-04-17 DIAGNOSIS — L03115 Cellulitis of right lower limb: Secondary | ICD-10-CM | POA: Diagnosis not present

## 2017-04-17 DIAGNOSIS — R21 Rash and other nonspecific skin eruption: Secondary | ICD-10-CM | POA: Diagnosis not present

## 2017-04-17 DIAGNOSIS — L820 Inflamed seborrheic keratosis: Secondary | ICD-10-CM | POA: Diagnosis not present

## 2017-04-17 DIAGNOSIS — D692 Other nonthrombocytopenic purpura: Secondary | ICD-10-CM | POA: Diagnosis not present

## 2017-04-17 DIAGNOSIS — L308 Other specified dermatitis: Secondary | ICD-10-CM | POA: Diagnosis not present

## 2017-05-01 DIAGNOSIS — L2089 Other atopic dermatitis: Secondary | ICD-10-CM | POA: Diagnosis not present

## 2017-05-01 DIAGNOSIS — L570 Actinic keratosis: Secondary | ICD-10-CM | POA: Diagnosis not present

## 2017-05-01 DIAGNOSIS — L821 Other seborrheic keratosis: Secondary | ICD-10-CM | POA: Diagnosis not present

## 2017-05-01 DIAGNOSIS — L820 Inflamed seborrheic keratosis: Secondary | ICD-10-CM | POA: Diagnosis not present

## 2017-05-01 DIAGNOSIS — L578 Other skin changes due to chronic exposure to nonionizing radiation: Secondary | ICD-10-CM | POA: Diagnosis not present

## 2017-05-01 DIAGNOSIS — L0390 Cellulitis, unspecified: Secondary | ICD-10-CM | POA: Diagnosis not present

## 2017-05-04 ENCOUNTER — Telehealth: Payer: Self-pay | Admitting: Family Medicine

## 2017-05-04 NOTE — Telephone Encounter (Signed)
Pt stated that at her last OV on 03/27/17 Cologuard was discussed and she was advised she would receive it via mail. Pt stated she hasn't received it and request a call back for an update. Please advise. Thanks TNP

## 2017-05-04 NOTE — Telephone Encounter (Signed)
Cheryl Roch do you have an order for this? Thanks ED

## 2017-05-10 ENCOUNTER — Other Ambulatory Visit: Payer: Self-pay

## 2017-05-12 NOTE — Telephone Encounter (Signed)
Order faxed to [employer]

## 2017-05-12 NOTE — Telephone Encounter (Signed)
Order sent to Dr Brita Romp to sign

## 2017-05-18 ENCOUNTER — Telehealth: Payer: Self-pay

## 2017-05-18 ENCOUNTER — Ambulatory Visit
Admission: RE | Admit: 2017-05-18 | Discharge: 2017-05-18 | Disposition: A | Discharge status: Home / Self Care | Payer: Medicare Other | Source: Ambulatory Visit | Attending: Family Medicine | Admitting: Family Medicine

## 2017-05-18 DIAGNOSIS — M810 Age-related osteoporosis without current pathological fracture: Secondary | ICD-10-CM

## 2017-05-18 DIAGNOSIS — Z780 Asymptomatic menopausal state: Secondary | ICD-10-CM | POA: Diagnosis not present

## 2017-05-18 DIAGNOSIS — Z0000 Encounter for general adult medical examination without abnormal findings: Secondary | ICD-10-CM

## 2017-05-18 NOTE — Telephone Encounter (Signed)
Pt advised.

## 2017-05-18 NOTE — Telephone Encounter (Signed)
-----   Message from Virginia Crews, MD sent at 05/18/2017  3:49 PM EST ----- Bone density is actually better than it was in 2015.  Still has osteopenia in femurs, but it is better.  Still recommend regular exercise, 1200mg  of Ca daily (either in diet or supplement) and 1000 IU Vit D daily.  Recheck in 2 years  Bacigalupo, Dionne Bucy, MD, MPH [facility] 05/18/2017 3:49 PM

## 2017-05-22 DIAGNOSIS — L570 Actinic keratosis: Secondary | ICD-10-CM | POA: Diagnosis not present

## 2017-05-23 DIAGNOSIS — Z1211 Encounter for screening for malignant neoplasm of colon: Secondary | ICD-10-CM | POA: Diagnosis not present

## 2017-05-23 DIAGNOSIS — Z1212 Encounter for screening for malignant neoplasm of rectum: Secondary | ICD-10-CM | POA: Diagnosis not present

## 2017-06-01 ENCOUNTER — Other Ambulatory Visit: Payer: Self-pay

## 2017-06-01 ENCOUNTER — Telehealth: Payer: Self-pay

## 2017-06-01 LAB — COLOGUARD: COLOGUARD: NEGATIVE

## 2017-06-01 NOTE — Telephone Encounter (Signed)
Pt advised.

## 2017-06-01 NOTE — Telephone Encounter (Signed)
-----   Message from Virginia Crews, MD sent at 06/01/2017 11:30 AM EST ----- Negative cologuard.  Next colon cancer screening due in 3 yrs.  Virginia Crews, MD, MPH [facility] 06/01/2017 11:30 AM

## 2017-06-16 ENCOUNTER — Encounter: Payer: Self-pay | Admitting: Family Medicine

## 2017-06-16 ENCOUNTER — Ambulatory Visit (INDEPENDENT_AMBULATORY_CARE_PROVIDER_SITE_OTHER): Payer: Medicare Other | Admitting: Family Medicine

## 2017-06-16 VITALS — BP 130/82 | HR 80 | Temp 97.6°F | Resp 16 | Wt 140.0 lb

## 2017-06-16 DIAGNOSIS — L309 Dermatitis, unspecified: Secondary | ICD-10-CM

## 2017-06-16 DIAGNOSIS — E7800 Pure hypercholesterolemia, unspecified: Secondary | ICD-10-CM | POA: Diagnosis not present

## 2017-06-16 NOTE — Assessment & Plan Note (Signed)
Managed by dermatology Patient is finding it difficult to find a medication that works well for her, but she continues triamcinolone cream at this time Discussed emollients, moisturizing Continue to follow-up with dermatology

## 2017-06-16 NOTE — Patient Instructions (Signed)

## 2017-06-16 NOTE — Progress Notes (Signed)
Patient: Cheryl Hays Female    DOB: [birth year]   72 y.o.   MRN: [id number] Visit Date: 06/16/2017  Today's Provider: Lavon Paganini, MD   Chief Complaint  Patient presents with  . Hyperlipidemia   Subjective:    HPI      Lipid/Cholesterol, Follow-up:   Last seen for this 3 months ago.  Management changes since that visit include adding Atorvastatin 20 mg. . Last Lipid Panel:    Component Value Date/Time   CHOL 235 (H) 03/28/2017 0816   CHOL 214 (H) 03/24/2016 0952   TRIG 87 03/28/2017 0816   HDL 85 03/28/2017 0816   HDL 65 03/24/2016 0952   CHOLHDL 2.8 03/28/2017 0816   LDLCALC 131 (H) 03/24/2016 0952    Risk factors for vascular disease include hypercholesterolemia  She reports good compliance with treatment. She is not having side effects. Does have back and knee pain.  Does not seem to have changed since starting statin. Weight trend: stable Current diet: "could be healthier." Current exercise: goes to gym 2 times per week for 1.5 hours; yoga, etc. Also walks.  Wt Readings from Last 3 Encounters:  06/16/17 140 lb (63.5 kg)  03/27/17 140 lb 12.8 oz (63.9 kg)  03/27/17 140 lb 12.8 oz (63.9 kg)    -------------------------------------------------------------------  Allergies  Allergen Reactions  . Celecoxib   . Levofloxacin Other (See Comments)    Muscle aches  . Sulfa Antibiotics   . Pseudoephedrine Rash     Current Outpatient Medications:  .  albuterol (PROVENTIL HFA;VENTOLIN HFA) 108 (90 Base) MCG/ACT inhaler, Inhale 2 puffs into the lungs every 6 (six) hours as needed for wheezing or shortness of breath., Disp: 1 Inhaler, Rfl: 3 .  atorvastatin (LIPITOR) 20 MG tablet, Take 1 tablet (20 mg total) daily by mouth., Disp: 30 tablet, Rfl: 5 .  Azelastine HCl 0.15 % SOLN, Place 1 spray into the nose 2 (two) times daily. (Patient taking differently: Place 1 spray daily into the nose. ), Disp: 30 mL, Rfl: 3 .  Calcium Carb-Cholecalciferol  (CALCIUM 600+D3) 600-800 MG-UNIT TABS, Take 1 tablet by mouth daily., Disp: , Rfl:  .  calcium carbonate (CALCIUM 600 HIGH POTENCY) 600 MG TABS tablet, Take 600 mg by mouth 2 (two) times daily with a meal., Disp: , Rfl:  .  hydrOXYzine (ATARAX/VISTARIL) 10 MG tablet, TK 1 TO 3 TS PO QHS PRF ITCH UTD, Disp: , Rfl: 0 .  meloxicam (MOBIC) 7.5 MG tablet, Take 1 tablet (7.5 mg total) by mouth daily., Disp: 90 tablet, Rfl: 1 .  montelukast (SINGULAIR) 10 MG tablet, Take 1 tablet (10 mg total) by mouth daily., Disp: 90 tablet, Rfl: 1 .  SYMBICORT 80-4.5 MCG/ACT inhaler, INHALE 2 PUFFS INTO THE LUNGS TWICE DAILY, Disp: 10.2 g, Rfl: 5 .  triamcinolone cream (KENALOG) 0.1 %, Apply 1 application 2 (two) times daily topically. , Disp: , Rfl: 0  Review of Systems  Constitutional: Negative for activity change, appetite change, chills, diaphoresis, fatigue, fever and unexpected weight change.  Cardiovascular: Negative for chest pain, palpitations and leg swelling.  Musculoskeletal: Positive for arthralgias and back pain.  Hematological: Bruises/bleeds easily (pt D/C ASA due to this).    Social History   Tobacco Use  . Smoking status: Former Smoker    Last attempt to quit: 05/16/1975    Years since quitting: 42.1  . Smokeless tobacco: Never Used  Substance Use Topics  . Alcohol use: Yes    Alcohol/week:  1.2 oz    Types: 2 Glasses of wine per week   Objective:   BP 130/82 (BP Location: Left Arm, Patient Position: Sitting, Cuff Size: Normal)   Pulse 80   Temp 97.6 F (36.4 C) (Oral)   Resp 16   Wt 140 lb (63.5 kg)   SpO2 98%   BMI 28.28 kg/m  Vitals:   06/16/17 1459  BP: 130/82  Pulse: 80  Resp: 16  Temp: 97.6 F (36.4 C)  TempSrc: Oral  SpO2: 98%  Weight: 140 lb (63.5 kg)     Physical Exam  Constitutional: She is oriented to person, place, and time. She appears well-developed and well-nourished. No distress.  HENT:  Head: Normocephalic and atraumatic.  Eyes: Conjunctivae are  normal.  Cardiovascular: Normal rate, regular rhythm, normal heart sounds and intact distal pulses.  No murmur heard. Pulmonary/Chest: Effort normal and breath sounds normal. No respiratory distress. She has no wheezes. She has no rales.  Musculoskeletal: She exhibits no edema.  Neurological: She is alert and oriented to person, place, and time.  Skin: Skin is warm and dry. Rash (eczema) noted.  Psychiatric: She has a normal mood and affect. Her behavior is normal.  Vitals reviewed.       Assessment & Plan:   Problem List Items Addressed This Visit      Musculoskeletal and Integument   Eczema    Managed by dermatology Patient is finding it difficult to find a medication that works well for her, but she continues triamcinolone cream at this time Discussed emollients, moisturizing Continue to follow-up with dermatology        Other   HLD (hyperlipidemia) - Primary    Tolerating her statin well  As arthralgias have not changed, most likely related to osteoarthritis due to age and not statin side effect We will continue atorvastatin at current dose Recheck LFTs and lipid panel Hoping to see reduction in LDL to less than 100, which is goal      Relevant Orders   Lipid Profile   Hepatic function panel       Return in about 6 months (around 12/14/2017) for chronic disease f/u.   The entirety of the information documented in the History of Present Illness, Review of Systems and Physical Exam were personally obtained by me. Portions of this information were initially documented by Raquel Sarna Ratchford, CMA and reviewed by me for thoroughness and accuracy.    Virginia Crews, MD, MPH [facility] 06/16/2017 4:15 PM

## 2017-06-16 NOTE — Assessment & Plan Note (Signed)
Tolerating her statin well  As arthralgias have not changed, most likely related to osteoarthritis due to age and not statin side effect We will continue atorvastatin at current dose Recheck LFTs and lipid panel Hoping to see reduction in LDL to less than 100, which is goal

## 2017-06-19 DIAGNOSIS — E7800 Pure hypercholesterolemia, unspecified: Secondary | ICD-10-CM | POA: Diagnosis not present

## 2017-06-20 ENCOUNTER — Telehealth: Payer: Self-pay

## 2017-06-20 LAB — LIPID PANEL
CHOLESTEROL TOTAL: 127 mg/dL (ref 100–199)
Chol/HDL Ratio: 2.3 ratio (ref 0.0–4.4)
HDL: 55 mg/dL (ref >39)
LDL CALC: 60 mg/dL (ref 0–99)
TRIGLYCERIDES: 62 mg/dL (ref 0–149)
VLDL Cholesterol Cal: 12 mg/dL (ref 5–40)

## 2017-06-20 LAB — HEPATIC FUNCTION PANEL
ALBUMIN: 4.3 g/dL (ref 3.5–4.8)
ALT: 25 IU/L (ref 0–32)
AST: 28 IU/L (ref 0–40)
Alkaline Phosphatase: 127 IU/L — ABNORMAL HIGH (ref 39–117)
BILIRUBIN, DIRECT: 0.15 mg/dL (ref 0.00–0.40)
Bilirubin Total: 0.4 mg/dL (ref 0.0–1.2)
Total Protein: 6.6 g/dL (ref 6.0–8.5)

## 2017-06-20 NOTE — Telephone Encounter (Signed)
Pt advised.

## 2017-06-20 NOTE — Telephone Encounter (Signed)
-----   Message from Margo Common, [place] sent at 06/20/2017  8:38 AM EST ----- All cholesterol and liver enzymes normal. Alkaline phosphatase a little elevated - probably due to arthritis. Continue present medications and recheck as planned with Dr. Jacinto Reap.

## 2017-07-19 DIAGNOSIS — L2089 Other atopic dermatitis: Secondary | ICD-10-CM | POA: Diagnosis not present

## 2017-07-19 DIAGNOSIS — L570 Actinic keratosis: Secondary | ICD-10-CM | POA: Diagnosis not present

## 2017-07-19 DIAGNOSIS — L578 Other skin changes due to chronic exposure to nonionizing radiation: Secondary | ICD-10-CM | POA: Diagnosis not present

## 2017-07-19 DIAGNOSIS — L820 Inflamed seborrheic keratosis: Secondary | ICD-10-CM | POA: Diagnosis not present

## 2017-08-02 ENCOUNTER — Encounter: Payer: Self-pay | Admitting: Family Medicine

## 2017-08-02 DIAGNOSIS — J45909 Unspecified asthma, uncomplicated: Secondary | ICD-10-CM

## 2017-08-03 MED ORDER — MONTELUKAST SODIUM 10 MG PO TABS: 10.0000 mg | ORAL_TABLET | Freq: Every day | ORAL | 3 refills | Status: DC

## 2017-09-12 ENCOUNTER — Ambulatory Visit (INDEPENDENT_AMBULATORY_CARE_PROVIDER_SITE_OTHER): Payer: Medicare Other | Admitting: Family Medicine

## 2017-09-12 ENCOUNTER — Encounter: Payer: Self-pay | Admitting: Family Medicine

## 2017-09-12 VITALS — BP 112/86 | HR 74 | Temp 98.1°F | Resp 16 | Wt 136.0 lb

## 2017-09-12 DIAGNOSIS — R7989 Other specified abnormal findings of blood chemistry: Secondary | ICD-10-CM

## 2017-09-12 DIAGNOSIS — R21 Rash and other nonspecific skin eruption: Secondary | ICD-10-CM | POA: Diagnosis not present

## 2017-09-12 DIAGNOSIS — R945 Abnormal results of liver function studies: Secondary | ICD-10-CM | POA: Diagnosis not present

## 2017-09-12 DIAGNOSIS — L309 Dermatitis, unspecified: Secondary | ICD-10-CM | POA: Diagnosis not present

## 2017-09-12 DIAGNOSIS — L299 Pruritus, unspecified: Secondary | ICD-10-CM | POA: Diagnosis not present

## 2017-09-12 DIAGNOSIS — R748 Abnormal levels of other serum enzymes: Secondary | ICD-10-CM | POA: Diagnosis not present

## 2017-09-12 NOTE — Progress Notes (Signed)
Patient: Cheryl Hays Female    DOB: [birth year]   72 y.o.   MRN: [id number] Visit Date: 09/12/2017  Today's Provider: Lavon Paganini, MD   I, Martha Clan, CMA, am acting as scribe for Lavon Paganini, MD.  Chief Complaint  Patient presents with  . Pruritis   Subjective:    HPI   Pt has been seeing Dr. Nehemiah Massed for pruritis for about 1 year. She has tried treatments for scabies, eczema and has been on steroid creams. She has also tried hydroxyzine, with relief. She was informed by friends that this could be related for kidneys/liver problems. She would like this checked today. She states the itching has improved over the last few weeks. She states she has had allergy testing several years ago, which was all negative.  She has recently been [place] in bed in the sun more than usual.  She describes a new rash on her chest as being hives like.  It is now resolved.  Allergies  Allergen Reactions  . Celecoxib   . Levofloxacin Other (See Comments)    Muscle aches  . Sulfa Antibiotics   . Pseudoephedrine Rash     Current Outpatient Medications:  .  albuterol (PROVENTIL HFA;VENTOLIN HFA) 108 (90 Base) MCG/ACT inhaler, Inhale 2 puffs into the lungs every 6 (six) hours as needed for wheezing or shortness of breath., Disp: 1 Inhaler, Rfl: 3 .  atorvastatin (LIPITOR) 20 MG tablet, Take 1 tablet (20 mg total) daily by mouth., Disp: 30 tablet, Rfl: 5 .  Azelastine HCl 0.15 % SOLN, Place 1 spray into the nose 2 (two) times daily. (Patient taking differently: Place 1 spray daily into the nose. ), Disp: 30 mL, Rfl: 3 .  Calcium Carb-Cholecalciferol (CALCIUM 600+D3) 600-800 MG-UNIT TABS, Take 1 tablet by mouth daily., Disp: , Rfl:  .  hydrOXYzine (ATARAX/VISTARIL) 10 MG tablet, TK 1 TO 3 TS PO QHS PRF ITCH UTD, Disp: , Rfl: 0 .  montelukast (SINGULAIR) 10 MG tablet, Take 1 tablet (10 mg total) by mouth daily., Disp: 90 tablet, Rfl: 3 .  SYMBICORT 80-4.5 MCG/ACT inhaler,  INHALE 2 PUFFS INTO THE LUNGS TWICE DAILY, Disp: 10.2 g, Rfl: 5 .  triamcinolone cream (KENALOG) 0.1 %, Apply 1 application 2 (two) times daily topically. , Disp: , Rfl: 0 .  meloxicam (MOBIC) 7.5 MG tablet, Take 1 tablet (7.5 mg total) by mouth daily. (Patient not taking: Reported on 09/12/2017), Disp: 90 tablet, Rfl: 1  Review of Systems  Constitutional: Negative for activity change, appetite change, chills, diaphoresis, fatigue, fever and unexpected weight change.  Respiratory: Negative for shortness of breath.   Cardiovascular: Negative for chest pain, palpitations and leg swelling.  Skin: Negative for color change, pallor, rash and wound.       Itching is present    Social History   Tobacco Use  . Smoking status: Former Smoker    Last attempt to quit: 05/16/1975    Years since quitting: 42.3  . Smokeless tobacco: Never Used  Substance Use Topics  . Alcohol use: Yes    Alcohol/week: 1.2 oz    Types: 2 Glasses of wine per week   Objective:   BP 112/86 (BP Location: Left Arm, Patient Position: Sitting, Cuff Size: Normal)   Pulse 74   Temp 98.1 F (36.7 C) (Oral)   Resp 16   Wt 136 lb (61.7 kg)   SpO2 99%   BMI 27.47 kg/m  Vitals:   09/12/17 1110  BP: 112/86  Pulse: 74  Resp: 16  Temp: 98.1 F (36.7 C)  TempSrc: Oral  SpO2: 99%  Weight: 136 lb (61.7 kg)     Physical Exam  Constitutional: She is oriented to person, place, and time. She appears well-developed and well-nourished. No distress.  HENT:  Head: Normocephalic and atraumatic.  Eyes: Conjunctivae are normal. No scleral icterus.  Neck: Neck supple. No thyromegaly present.  Cardiovascular: Normal rate, regular rhythm, normal heart sounds and intact distal pulses.  No murmur heard. Pulmonary/Chest: Effort normal and breath sounds normal. No respiratory distress. She has no wheezes. She has no rales.  Musculoskeletal: She exhibits no edema.  Lymphadenopathy:    She has no cervical adenopathy.    Neurological: She is alert and oriented to person, place, and time.  Skin: Skin is warm and dry. Capillary refill takes less than 2 seconds. Rash (erythematous plaques with excoriations over b/l shins) noted.  Psychiatric: She has a normal mood and affect. Her behavior is normal.  Vitals reviewed.     Assessment & Plan:  1. Pruritus - will screen for any metabolic issues that could cause this - Comprehensive metabolic panel - Magnesium - Phosphorus  2. Rash and nonspecific skin eruption -Being followed by dermatology -She has tried multiple steroid creams without resolution - The rash on her chest is no longer present, but the fact that it appeared with sun exposure and has since resolved makes me wonder if she could have a polymorphic light eruption and discuss this with the patient - The treatment for this would be steroid cream so she could continue these -She can discuss this with her dermatologist - Comprehensive metabolic panel - Magnesium - Phosphorus  3. Eczema, unspecified type Rash on legs seems consistent with eczema which was a biopsy proven diagnosis by her dermatologist She should continue her steroid creams -Also discussed use of fragrance free moisturizing products  4. Abnormal LFTs - recheck today - alk phos was elevated - asymptomatic - Comprehensive metabolic panel   Return if symptoms worsen or fail to improve.   The entirety of the information documented in the History of Present Illness, Review of Systems and Physical Exam were personally obtained by me. Portions of this information were initially documented by Raquel Sarna Ratchford, CMA and reviewed by me for thoroughness and accuracy.    Virginia Crews, MD, MPH [facility] 09/13/2017 10:56 AM

## 2017-09-13 ENCOUNTER — Telehealth: Payer: Self-pay

## 2017-09-13 ENCOUNTER — Encounter: Payer: Self-pay | Admitting: Family Medicine

## 2017-09-13 LAB — COMPREHENSIVE METABOLIC PANEL
ALT: 31 IU/L (ref 0–32)
AST: 28 IU/L (ref 0–40)
Albumin/Globulin Ratio: 2 (ref 1.2–2.2)
Albumin: 4.5 g/dL (ref 3.5–4.8)
Alkaline Phosphatase: 124 IU/L — ABNORMAL HIGH (ref 39–117)
BILIRUBIN TOTAL: 0.7 mg/dL (ref 0.0–1.2)
BUN/Creatinine Ratio: 17 (ref 12–28)
BUN: 11 mg/dL (ref 8–27)
CALCIUM: 9.2 mg/dL (ref 8.7–10.3)
CHLORIDE: 102 mmol/L (ref 96–106)
CO2: 26 mmol/L (ref 20–29)
Creatinine, Ser: 0.63 mg/dL (ref 0.57–1.00)
GFR, EST AFRICAN AMERICAN: 103 mL/min/{1.73_m2} (ref >59)
GFR, EST NON AFRICAN AMERICAN: 89 mL/min/{1.73_m2} (ref >59)
GLUCOSE: 100 mg/dL — AB (ref 65–99)
Globulin, Total: 2.2 g/dL (ref 1.5–4.5)
Potassium: 4.2 mmol/L (ref 3.5–5.2)
Sodium: 142 mmol/L (ref 134–144)
TOTAL PROTEIN: 6.7 g/dL (ref 6.0–8.5)

## 2017-09-13 LAB — PHOSPHORUS: PHOSPHORUS: 3.3 mg/dL (ref 2.5–4.5)

## 2017-09-13 LAB — MAGNESIUM: Magnesium: 2 mg/dL (ref 1.6–2.3)

## 2017-09-13 NOTE — Telephone Encounter (Signed)
Viewed by Terramuggus on 09/13/2017 3:29 PM

## 2017-09-13 NOTE — Telephone Encounter (Signed)
-----   Message from Virginia Crews, MD sent at 09/13/2017 11:35 AM EDT ----- Normal kidney function, electrolytes.  Alkaline phosphatase, a liver enzyme that is nonspecific to the liver, is still elevated, but other liver enzymes are normal.  We will add on another test that is more specific to the liver to confirm that this is of liver origin.  This would have nothing to do with your itching or rash, however.  I also wonder if you are having any right upper abdominal pain or pain after eating fatty foods, as this could represent a gallbladder issue.  Raquel Sarna, please add on GGT  Bacigalupo, Dionne Bucy, MD, MPH [facility] 09/13/2017 11:34 AM

## 2017-09-13 NOTE — Telephone Encounter (Signed)
Asked phlebotomist to add this test. Will need to call pt with results.

## 2017-09-15 LAB — SPECIMEN STATUS REPORT

## 2017-09-15 LAB — GAMMA GT: GGT: 85 IU/L — ABNORMAL HIGH (ref 0–60)

## 2017-10-04 ENCOUNTER — Other Ambulatory Visit: Payer: Self-pay | Admitting: Family Medicine

## 2018-01-23 DIAGNOSIS — Z23 Encounter for immunization: Secondary | ICD-10-CM | POA: Diagnosis not present

## 2018-01-31 DIAGNOSIS — L820 Inflamed seborrheic keratosis: Secondary | ICD-10-CM | POA: Diagnosis not present

## 2018-01-31 DIAGNOSIS — L2089 Other atopic dermatitis: Secondary | ICD-10-CM | POA: Diagnosis not present

## 2018-01-31 DIAGNOSIS — L578 Other skin changes due to chronic exposure to nonionizing radiation: Secondary | ICD-10-CM | POA: Diagnosis not present

## 2018-01-31 DIAGNOSIS — Z85828 Personal history of other malignant neoplasm of skin: Secondary | ICD-10-CM | POA: Diagnosis not present

## 2018-01-31 DIAGNOSIS — L812 Freckles: Secondary | ICD-10-CM | POA: Diagnosis not present

## 2018-01-31 DIAGNOSIS — L821 Other seborrheic keratosis: Secondary | ICD-10-CM | POA: Diagnosis not present

## 2018-01-31 DIAGNOSIS — Z1283 Encounter for screening for malignant neoplasm of skin: Secondary | ICD-10-CM | POA: Diagnosis not present

## 2018-01-31 DIAGNOSIS — L299 Pruritus, unspecified: Secondary | ICD-10-CM | POA: Diagnosis not present

## 2018-03-13 DIAGNOSIS — L2089 Other atopic dermatitis: Secondary | ICD-10-CM | POA: Diagnosis not present

## 2018-03-14 ENCOUNTER — Other Ambulatory Visit: Payer: Self-pay | Admitting: Family Medicine

## 2018-03-14 DIAGNOSIS — Z1231 Encounter for screening mammogram for malignant neoplasm of breast: Secondary | ICD-10-CM

## 2018-03-18 ENCOUNTER — Emergency Department
Admission: EM | Admit: 2018-03-18 | Discharge: 2018-03-18 | Disposition: A | Discharge status: Home / Self Care | Payer: Medicare Other | Attending: Emergency Medicine | Admitting: Emergency Medicine

## 2018-03-18 ENCOUNTER — Encounter: Payer: Self-pay | Admitting: Emergency Medicine

## 2018-03-18 DIAGNOSIS — Z87891 Personal history of nicotine dependence: Secondary | ICD-10-CM | POA: Diagnosis not present

## 2018-03-18 DIAGNOSIS — Z79899 Other long term (current) drug therapy: Secondary | ICD-10-CM | POA: Diagnosis not present

## 2018-03-18 DIAGNOSIS — T50Z95A Adverse effect of other vaccines and biological substances, initial encounter: Secondary | ICD-10-CM | POA: Diagnosis not present

## 2018-03-18 DIAGNOSIS — L270 Generalized skin eruption due to drugs and medicaments taken internally: Secondary | ICD-10-CM | POA: Insufficient documentation

## 2018-03-18 DIAGNOSIS — J45909 Unspecified asthma, uncomplicated: Secondary | ICD-10-CM | POA: Diagnosis not present

## 2018-03-18 DIAGNOSIS — R21 Rash and other nonspecific skin eruption: Secondary | ICD-10-CM | POA: Diagnosis present

## 2018-03-18 MED ORDER — FAMOTIDINE 20 MG PO TABS: 20.0000 mg | ORAL_TABLET | Freq: Two times a day (BID) | ORAL | 1 refills | Status: DC

## 2018-03-18 MED ORDER — DIPHENHYDRAMINE HCL 50 MG/ML IJ SOLN
25.0000 mg | Freq: Once | INTRAMUSCULAR | Status: AC
  Administered 2018-03-18: 25 mg via INTRAMUSCULAR
  Filled 2018-03-18: qty 1

## 2018-03-18 MED ORDER — FAMOTIDINE 20 MG PO TABS
20.0000 mg | ORAL_TABLET | Freq: Once | ORAL | Status: AC
  Administered 2018-03-18: 20 mg via ORAL
  Filled 2018-03-18: qty 1

## 2018-03-18 MED ORDER — PREDNISONE 10 MG (21) PO TBPK: ORAL_TABLET | ORAL | 0 refills | Status: DC

## 2018-03-18 MED ORDER — METHYLPREDNISOLONE SODIUM SUCC 125 MG IJ SOLR
125.0000 mg | Freq: Once | INTRAMUSCULAR | Status: AC
  Administered 2018-03-18: 125 mg via INTRAMUSCULAR
  Filled 2018-03-18: qty 2

## 2018-03-18 NOTE — ED Provider Notes (Signed)
[facility] Emergency Department Provider Note  ____________________________________________  Time seen: Approximately 8:08 PM  I have reviewed the triage vital signs and the nursing notes.   HISTORY  Chief Complaint Urticaria   HPI Cheryl Hays is a 72 y.o. female who presents to the emergency department for treatment and evaluation of a burning, tender rash that started approximately 3 AM.  She received an injection of Dupixent 3 days ago at her dermatologist office in hopes of treating severe eczema.  She states that 3 AM she awoke with this rash.  She states that it it is very hot and tender.  After reviewing her information from the dermatologist, she decided she should come in for an evaluation.  She has had no wheezing, airway issues, cough, or other symptoms of concern.  No alleviating measures attempted prior to arrival.   History reviewed. No pertinent past medical history.  Patient Active Problem List   Diagnosis Date Noted  . Eczema 06/16/2017  . Snoring 03/27/2017  . Asthma 10/31/2014  . Abnormal LFTs 10/31/2014  . Blood glucose elevated 10/31/2014  . HLD (hyperlipidemia) 10/31/2014  . OP (osteoporosis) 10/31/2014  . Allergic rhinitis, seasonal 10/31/2014    Past Surgical History:  Procedure Laterality Date  . BREAST SURGERY Left 2000   biopsy  . CESAREAN SECTION  05/08/1978, 07/03/1979  . DILATION AND CURETTAGE OF UTERUS  1993  . FACIAL COSMETIC SURGERY  2006  . SKIN SURGERY Right    hand 02/2016 x's 2 Dr. Nehemiah Massed    Prior to Admission medications   Medication Sig Start Date End Date Taking? Authorizing Provider  albuterol (PROVENTIL HFA;VENTOLIN HFA) 108 (90 Base) MCG/ACT inhaler Inhale 2 puffs into the lungs every 6 (six) hours as needed for wheezing or shortness of breath. 06/14/16   Carmon Ginsberg, PA  atorvastatin (LIPITOR) 20 MG tablet TAKE 1 TABLET(20 MG) BY MOUTH DAILY 10/05/17   Bacigalupo, Dionne Bucy, MD  Azelastine HCl  0.15 % SOLN Place 1 spray into the nose 2 (two) times daily. Patient taking differently: Place 1 spray daily into the nose.  06/14/16   Carmon Ginsberg, PA  Calcium Carb-Cholecalciferol (CALCIUM 600+D3) 600-800 MG-UNIT TABS Take 1 tablet by mouth daily.    [provider]  famotidine (PEPCID) 20 MG tablet Take 1 tablet (20 mg total) by mouth 2 (two) times daily. 03/18/18 03/18/19  Nykiah Ma, Johnette Abraham B, FNP  hydrOXYzine (ATARAX/VISTARIL) 10 MG tablet TK 1 TO 3 TS PO QHS PRF ITCH UTD 04/17/17   [provider]  meloxicam (MOBIC) 7.5 MG tablet Take 1 tablet (7.5 mg total) by mouth daily. Patient not taking: Reported on 09/12/2017 03/23/15   Margarita Rana, MD  montelukast (SINGULAIR) 10 MG tablet Take 1 tablet (10 mg total) by mouth daily. 08/03/17   Bacigalupo, Dionne Bucy, MD  predniSONE (STERAPRED UNI-PAK 21 TAB) 10 MG (21) TBPK tablet Take 6 tablets on the first day and decrease by 1 tablet each day until finished. 03/18/18   Sherrie George B, FNP  SYMBICORT 80-4.5 MCG/ACT inhaler INHALE 2 PUFFS INTO THE LUNGS TWICE DAILY 10/12/16   Carmon Ginsberg, PA  triamcinolone cream (KENALOG) 0.1 % Apply 1 application 2 (two) times daily topically.  01/25/17   [provider]    Allergies Celecoxib; Levofloxacin; Sulfa antibiotics; and Pseudoephedrine  Family History  Problem Relation Age of Onset  . Heart murmur Mother   . Vision loss Father   . Bladder Cancer Brother     Social History Social  History   Tobacco Use  . Smoking status: Former Smoker    Last attempt to quit: 05/16/1975    Years since quitting: 42.8  . Smokeless tobacco: Never Used  Substance Use Topics  . Alcohol use: Yes    Alcohol/week: 2.0 standard drinks    Types: 2 Glasses of wine per week  . Drug use: No    Review of Systems  Constitutional: Negative for fever. Respiratory: Negative for cough or shortness of breath.  Musculoskeletal: Negative for myalgias Skin: Positive for diffuse rash Neurological:  Negative for numbness or paresthesias. ____________________________________________   PHYSICAL EXAM:  VITAL SIGNS: ED Triage Vitals  Enc Vitals Group     BP 03/18/18 1815 (!) 136/119     Pulse Rate 03/18/18 1815 94     Resp 03/18/18 1815 18     Temp 03/18/18 1815 97.8 F (36.6 C)     Temp Source 03/18/18 1815 Oral     SpO2 03/18/18 1815 100 %     Weight 03/18/18 1816 134 lb (60.8 kg)     Height 03/18/18 1816 5' (1.524 m)     Head Circumference --      Peak Flow --      Pain Score 03/18/18 1816 9     Pain Loc --      Pain Edu? --      Excl. in [place]? --      Constitutional: Uncomfortable appearing. Eyes: Conjunctivae are clear without discharge or drainage. Nose: No rhinorrhea noted. Mouth/Throat: Airway is patent.  Neck: No stridor. Unrestricted range of motion observed. Cardiovascular: Capillary refill is <3 seconds.  Respiratory: Respirations are even and unlabored.. Musculoskeletal: Unrestricted range of motion observed. Neurologic: Awake, alert, and oriented x 4.  Skin: Maculopapular, erythematous coalesced rash on the neck, trunk, upper and lower extremities that spares the face, palms, and soles.  ____________________________________________   LABS (all labs ordered are listed, but only abnormal results are displayed)  Labs Reviewed - No data to display ____________________________________________  EKG  Not indicated. ____________________________________________  RADIOLOGY  Not indicated ____________________________________________   PROCEDURES  Procedures ____________________________________________   INITIAL IMPRESSION / ASSESSMENT AND PLAN / ED COURSE  Cheryl Hays is a 72 y.o. female who presents to the emergency department for treatment and evaluation of rash 3 days after getting a Dupixent injection.  Solu-Medrol, Benadryl IM have been requested in addition to Pepcid p.o.  We will evaluate the patient after medications and formulate a  plan.   ----------------------------------------- 9:12 PM on 03/18/2018 -----------------------------------------  Patient reports improvement of the soreness of the rash.  Some of the erythema has also started to dissipate especially on the chest wall.  She will be discharged home with prescriptions for a tapered dose of prednisone and Pepcid.  She is to also take Benadryl every 6-8 hours for the next couple of days.  She is to call and schedule an appointment with Dr. Nehemiah Massed who is her dermatologist.  She was also encouraged to investigate scheduling an appointment with rheumatology.  She states that she has an appointment with her primary care provider this upcoming week and will discuss this with them.  She was advised to return to the emergency department for any symptom that changes or worsens if she is unable to see primary care or dermatology.  Medications  methylPREDNISolone sodium succinate (SOLU-MEDROL) 125 mg/2 mL injection 125 mg (125 mg Intramuscular Given 03/18/18 2023)  diphenhydrAMINE (BENADRYL) injection 25 mg (25 mg Intramuscular Given 03/18/18 2020)  famotidine (PEPCID) tablet 20 mg (20 mg Oral Given 03/18/18 2019)     Pertinent labs & imaging results that were available during my care of the patient were reviewed by me and considered in my medical decision making (see chart for details).  ____________________________________________   FINAL CLINICAL IMPRESSION(S) / ED DIAGNOSES  Final diagnoses:  Drug rash    ED Discharge Orders         Ordered    predniSONE (STERAPRED UNI-PAK 21 TAB) 10 MG (21) TBPK tablet     03/18/18 2111    famotidine (PEPCID) 20 MG tablet  2 times daily     03/18/18 2111           Note:  This document was prepared using Dragon voice recognition software and may include unintentional dictation errors.    Victorino Dike, FNP 03/18/18 2114    Earleen Newport, MD 03/18/18 2246

## 2018-03-18 NOTE — Discharge Instructions (Signed)
Please call Dr. Alveria Apley office in the morning.  Your prescriptions have been called into your pharmacy.  You may need to take an additional dose of Benadryl tonight.  I recommend that you follow-up with a rheumatologist as well.  Return to the emergency department for symptoms of change or worsen if you are unable to schedule an appointment with primary care, dermatology, or rheumatology.

## 2018-03-18 NOTE — ED Triage Notes (Signed)
Patient presents to the ED with rash/hives since 3am.  Patient was recently started on a medication called "dupixent". Patient states she had a recent shot of the medication on Tuesday.  Patient states the pamphlet her doctor gave her said to come to the ED for a new rash while on medication.  Patient states hives/rash is burning.

## 2018-03-19 ENCOUNTER — Encounter: Payer: Self-pay | Admitting: Family Medicine

## 2018-03-20 ENCOUNTER — Encounter: Payer: Self-pay | Admitting: Family Medicine

## 2018-03-20 DIAGNOSIS — L270 Generalized skin eruption due to drugs and medicaments taken internally: Secondary | ICD-10-CM

## 2018-03-20 DIAGNOSIS — R21 Rash and other nonspecific skin eruption: Secondary | ICD-10-CM

## 2018-03-28 ENCOUNTER — Ambulatory Visit (INDEPENDENT_AMBULATORY_CARE_PROVIDER_SITE_OTHER): Payer: Medicare Other | Admitting: Family Medicine

## 2018-03-28 ENCOUNTER — Encounter: Payer: Self-pay | Admitting: Family Medicine

## 2018-03-28 ENCOUNTER — Ambulatory Visit (INDEPENDENT_AMBULATORY_CARE_PROVIDER_SITE_OTHER): Payer: Medicare Other

## 2018-03-28 VITALS — BP 131/80 | HR 71 | Temp 98.2°F | Ht 59.0 in | Wt 133.8 lb

## 2018-03-28 VITALS — BP 132/72 | HR 79 | Temp 98.1°F | Ht 59.0 in | Wt 135.2 lb

## 2018-03-28 DIAGNOSIS — R21 Rash and other nonspecific skin eruption: Secondary | ICD-10-CM | POA: Diagnosis not present

## 2018-03-28 DIAGNOSIS — E7800 Pure hypercholesterolemia, unspecified: Secondary | ICD-10-CM | POA: Diagnosis not present

## 2018-03-28 DIAGNOSIS — Z79899 Other long term (current) drug therapy: Secondary | ICD-10-CM

## 2018-03-28 DIAGNOSIS — Z0000 Encounter for general adult medical examination without abnormal findings: Secondary | ICD-10-CM | POA: Diagnosis not present

## 2018-03-28 DIAGNOSIS — R7989 Other specified abnormal findings of blood chemistry: Secondary | ICD-10-CM

## 2018-03-28 DIAGNOSIS — R739 Hyperglycemia, unspecified: Secondary | ICD-10-CM | POA: Diagnosis not present

## 2018-03-28 DIAGNOSIS — Z789 Other specified health status: Secondary | ICD-10-CM | POA: Diagnosis not present

## 2018-03-28 DIAGNOSIS — R945 Abnormal results of liver function studies: Secondary | ICD-10-CM | POA: Diagnosis not present

## 2018-03-28 MED ORDER — HYDROXYZINE HCL 10 MG PO TABS: 10.0000 mg | ORAL_TABLET | Freq: Three times a day (TID) | ORAL | 2 refills | Status: DC | PRN

## 2018-03-28 NOTE — Patient Instructions (Signed)
Preventive Care 73 Years and Older, Female Preventive care refers to lifestyle choices and visits with your health care provider that can promote health and wellness. What does preventive care include?  A yearly physical exam. This is also called an annual well check.  Dental exams once or twice a year.  Routine eye exams. Ask your health care provider how often you should have your eyes checked.  Personal lifestyle choices, including: ? Daily care of your teeth and gums. ? Regular physical activity. ? Eating a healthy diet. ? Avoiding tobacco and drug use. ? Limiting alcohol use. ? Practicing safe sex. ? Taking low-dose aspirin every day. ? Taking vitamin and mineral supplements as recommended by your health care provider. What happens during an annual well check? The services and screenings done by your health care provider during your annual well check will depend on your age, overall health, lifestyle risk factors, and family history of disease. Counseling Your health care provider may ask you questions about your:  Alcohol use.  Tobacco use.  Drug use.  Emotional well-being.  Home and relationship well-being.  Sexual activity.  Eating habits.  History of falls.  Memory and ability to understand (cognition).  Work and work environment.  Reproductive health.  Screening You may have the following tests or measurements:  Height, weight, and BMI.  Blood pressure.  Lipid and cholesterol levels. These may be checked every 5 years, or more frequently if you are over 50 years old.  Skin check.  Lung cancer screening. You may have this screening every year starting at age 55 if you have a 30-pack-year history of smoking and currently smoke or have quit within the past 15 years.  Fecal occult blood test (FOBT) of the stool. You may have this test every year starting at age 50.  Flexible sigmoidoscopy or colonoscopy. You may have a sigmoidoscopy every 5 years or  a colonoscopy every 10 years starting at age 50.  Hepatitis C blood test.  Hepatitis B blood test.  Sexually transmitted disease (STD) testing.  Diabetes screening. This is done by checking your blood sugar (glucose) after you have not eaten for a while (fasting). You may have this done every 1-3 years.  Bone density scan. This is done to screen for osteoporosis. You may have this done starting at age 65.  Mammogram. This may be done every 1-2 years. Talk to your health care provider about how often you should have regular mammograms.  Talk with your health care provider about your test results, treatment options, and if necessary, the need for more tests. Vaccines Your health care provider may recommend certain vaccines, such as:  Influenza vaccine. This is recommended every year.  Tetanus, diphtheria, and acellular pertussis (Tdap, Td) vaccine. You may need a Td booster every 10 years.  Varicella vaccine. You may need this if you have not been vaccinated.  Zoster vaccine. You may need this after age 60.  Measles, mumps, and rubella (MMR) vaccine. You may need at least one dose of MMR if you were born in 1957 or later. You may also need a second dose.  Pneumococcal 13-valent conjugate (PCV13) vaccine. One dose is recommended after age 65.  Pneumococcal polysaccharide (PPSV23) vaccine. One dose is recommended after age 65.  Meningococcal vaccine. You may need this if you have certain conditions.  Hepatitis A vaccine. You may need this if you have certain conditions or if you travel or work in places where you may be exposed to hepatitis   A.  Hepatitis B vaccine. You may need this if you have certain conditions or if you travel or work in places where you may be exposed to hepatitis B.  Haemophilus influenzae type b (Hib) vaccine. You may need this if you have certain conditions.  Talk to your health care provider about which screenings and vaccines you need and how often you  need them. This information is not intended to replace advice given to you by your health care provider. Make sure you discuss any questions you have with your health care provider. Document Released: 05/29/2015 Document Revised: 01/20/2016 Document Reviewed: 03/03/2015 Elsevier Interactive Patient Education  2018 Elsevier Inc[employer]  

## 2018-03-28 NOTE — Assessment & Plan Note (Signed)
Recheck A1c 

## 2018-03-28 NOTE — Assessment & Plan Note (Signed)
Tolerating her statin well Reassured her that we did see a large reduction in LDL after starting atorvastatin Recheck fasting lipid panel and CMP today

## 2018-03-28 NOTE — Progress Notes (Signed)
Subjective:   Cheryl Hays is a 72 y.o. female who presents for Medicare Annual (Subsequent) preventive examination.  Review of Systems:  N/A  Cardiac Risk Factors include: advanced age (>76men, >7 women);dyslipidemia     Objective:     Vitals: BP 132/72 (BP Location: Right Arm)   Pulse 79   Temp 98.1 F (36.7 C) (Oral)   Ht 4\' 11"  (1.499 m)   Wt 135 lb 3.2 oz (61.3 kg)   BMI 27.31 kg/m   Body mass index is 27.31 kg/m.  Advanced Directives 03/28/2018 03/18/2018 03/27/2017 03/23/2016 03/23/2015 12/17/2014  Does Patient Have a Medical Advance Directive? Yes No Yes Yes No;Yes Yes  Type of Paramedic of [place];Living will - [place];Living will [place];Living will [place];Living will Living will;Healthcare Power of [place] in Chart? Yes - validated most recent copy scanned in chart (See row information) - No - copy requested - - -  Would patient like information on creating a medical advance directive? - No - Patient declined - - - -    Tobacco Social History   Tobacco Use  Smoking Status Former Smoker  . Last attempt to quit: 05/16/1975  . Years since quitting: 42.8  Smokeless Tobacco Never Used     Counseling given: Not Answered   Clinical Intake:  Pre-visit preparation completed: No  Pain : No/denies pain Pain Score: 0-No pain     Diabetes: No  How often do you need to have someone help you when you read instructions, pamphlets, or other written materials from your doctor or pharmacy?: 1 - Never  Interpreter Needed?: No  Information entered by :: [facility], LPN  Past Medical History:  Diagnosis Date  . Hyperlipidemia    Past Surgical History:  Procedure Laterality Date  . BREAST SURGERY Left 2000   biopsy  . CESAREAN SECTION  05/08/1978, 07/03/1979  . DILATION AND CURETTAGE OF UTERUS  1993  . FACIAL COSMETIC SURGERY   2006  . SKIN SURGERY Right    hand 02/2016 x's 2 Dr. Nehemiah Massed   Family History  Problem Relation Age of Onset  . Heart murmur Mother   . Vision loss Father   . Bladder Cancer Brother    Social History   Socioeconomic History  . Marital status: Widowed    Spouse name: Not on file  . Number of children: 2  . Years of education: 30  . Highest education level: Some college, no degree  Occupational History  . Occupation: retired  Scientific laboratory technician  . Financial resource strain: Not hard at all  . Food insecurity:    Worry: Never true    Inability: Never true  . Transportation needs:    Medical: No    Non-medical: No  Tobacco Use  . Smoking status: Former Smoker    Last attempt to quit: 05/16/1975    Years since quitting: 42.8  . Smokeless tobacco: Never Used  Substance and Sexual Activity  . Alcohol use: Yes    Comment: a couple drink monthly  . Drug use: No  . Sexual activity: Not on file  Lifestyle  . Physical activity:    Days per week: 0 days    Minutes per session: 0 min  . Stress: Not at all  Relationships  . Social connections:    Talks on phone: Patient refused    Gets together: Patient refused    Attends religious  service: Patient refused    Active member of club or organization: Patient refused    Attends meetings of clubs or organizations: Patient refused    Relationship status: Patient refused  Other Topics Concern  . Not on file  Social History Narrative  . Not on file    Outpatient Encounter Medications as of 03/28/2018  Medication Sig  . albuterol (PROVENTIL HFA;VENTOLIN HFA) 108 (90 Base) MCG/ACT inhaler Inhale 2 puffs into the lungs every 6 (six) hours as needed for wheezing or shortness of breath.  Marland Kitchen atorvastatin (LIPITOR) 20 MG tablet TAKE 1 TABLET(20 MG) BY MOUTH DAILY  . Azelastine HCl 0.15 % SOLN Place 1 spray into the nose 2 (two) times daily.  . Calcium Carb-Cholecalciferol (CALCIUM 600+D3) 600-800 MG-UNIT TABS Take 1 tablet by mouth daily.    . diphenhydrAMINE (BENADRYL) 25 MG tablet Take 25 mg by mouth at bedtime as needed.  . famotidine (PEPCID) 20 MG tablet Take 1 tablet (20 mg total) by mouth 2 (two) times daily.  . hydrOXYzine (ATARAX/VISTARIL) 10 MG tablet TK 1 TO 3 TS PO QHS PRF ITCH UTD  . meloxicam (MOBIC) 7.5 MG tablet Take 1 tablet (7.5 mg total) by mouth daily.  . montelukast (SINGULAIR) 10 MG tablet Take 1 tablet (10 mg total) by mouth daily.  . SYMBICORT 80-4.5 MCG/ACT inhaler INHALE 2 PUFFS INTO THE LUNGS TWICE DAILY  . predniSONE (STERAPRED UNI-PAK 21 TAB) 10 MG (21) TBPK tablet Take 6 tablets on the first day and decrease by 1 tablet each day until finished. (Patient not taking: Reported on 03/28/2018)  . triamcinolone cream (KENALOG) 0.1 % Apply 1 application 2 (two) times daily topically.    No facility-administered encounter medications on file as of 03/28/2018.     Activities of Daily Living In your present state of health, do you have any difficulty performing the following activities: 03/28/2018  Hearing? N  Vision? N  Difficulty concentrating or making decisions? N  Walking or climbing stairs? N  Dressing or bathing? N  Doing errands, shopping? N  Preparing Food and eating ? N  Using the Toilet? N  In the past six months, have you accidently leaked urine? N  Do you have problems with loss of bowel control? N  Managing your Medications? N  Managing your Finances? N  Housekeeping or managing your Housekeeping? N  Some recent data might be hidden    Patient Care Team: Virginia Crews, MD as PCP - General (Family Medicine) Marlyn Corporal Clearnce Sorrel, PA-C as Physician Assistant (Family Medicine) Ralene Bathe, MD as Consulting Physician (Dermatology) Eulogio Bear, MD as Consulting Physician (Ophthalmology)    Assessment:   This is a routine wellness examination for Doyce.  Exercise Activities and Dietary recommendations Current Exercise Habits: Home exercise routine, Type of  exercise: walking, Time (Minutes): > 60(1.5 hr), Frequency (Times/Week): 1, Weekly Exercise (Minutes/Week): 0, Intensity: Mild, Exercise limited by: None identified  Goals    . Exercise 3x per week (30 min per time)     Recommend to exercise for 3 days a week for at least 30 minutes at a time.      . Increase water intake     Recommend increasing water intake to 4-6 glasses a day.        Fall Risk Fall Risk  03/28/2018 03/27/2017 03/23/2016 03/23/2015  Falls in the past year? 0 No No No   FALL RISK PREVENTION PERTAINING TO THE HOME:  Any stairs in or around the home  WITH handrails? Yes  Home free of loose throw rugs in walkways, pet beds, electrical cords, etc? Yes  Adequate lighting in your home to reduce risk of falls? Yes   ASSISTIVE DEVICES UTILIZED TO PREVENT FALLS:  Life alert? No  Use of a cane, walker or w/c? No  Grab bars in the bathroom? No  Shower chair or bench in shower? No  Elevated toilet seat or a handicapped toilet? Yes    TIMED UP AND GO:  Was the test performed? No .     Depression Screen PHQ 2/9 Scores 03/28/2018 03/27/2017 03/27/2017 03/23/2016  PHQ - 2 Score 0 0 0 0  PHQ- 9 Score - 0 - -     Cognitive Function: Declined today.        Immunization History  Administered Date(s) Administered  . Influenza, High Dose Seasonal PF 03/01/2017, 01/23/2018  . Influenza-Unspecified 02/01/2016  . Pneumococcal Conjugate-13 04/16/2014  . Pneumococcal Polysaccharide-23 03/31/2008, 03/23/2016  . Tdap 06/03/2011  . Zoster 05/06/2009    Qualifies for Shingles Vaccine? Yes  Zostavax completed 05/06/09. Due for Shingrix. Education has been provided regarding the importance of this vaccine. Pt has been advised to call insurance company to determine out of pocket expense. Advised may also receive vaccine at local pharmacy or Health Dept. Verbalized acceptance and understanding.  Tdap: Up to date  Flu Vaccine: Up to date  Pneumococcal Vaccine: Up to  date   Screening Tests Health Maintenance  Topic Date Due  . MAMMOGRAM  03/24/2019  . DEXA SCAN  05/19/2019  . Fecal DNA (Cologuard)  05/24/2020  . TETANUS/TDAP  06/02/2021  . INFLUENZA VACCINE  Completed  . Hepatitis C Screening  Completed  . PNA vac Low Risk Adult  Completed    Cancer Screenings:  Colorectal Screening: Cologuard completed 05/24/17. Repeat every 3 years.  Mammogram: Completed 03/23/17. Scheduled for 04/20/18.  Bone Density: Completed 05/18/17. Results reflect OSTEOPOROSIS. Repeat every 2 years.   Lung Cancer Screening: (Low Dose CT Chest recommended if Age 56-80 years, 30 pack-year currently smoking OR have quit w/in 15years.) does not qualify.    Additional Screening:  Hepatitis C Screening: Up to date  Vision Screening: Recommended annual ophthalmology exams for early detection of glaucoma and other disorders of the eye.  Dental Screening: Recommended annual dental exams for proper oral hygiene  Community Resource Referral:  CRR required this visit?  No       Plan:  I have personally reviewed and addressed the Medicare Annual Wellness questionnaire and have noted the following in the patient's chart:  A. Medical and social history B. Use of alcohol, tobacco or illicit drugs  C. Current medications and supplements D. Functional ability and status E.  Nutritional status F.  Physical activity G. Advance directives H. List of other physicians I.  Hospitalizations, surgeries, and ER visits in previous 12 months J.  [place] such as hearing and vision if needed, cognitive and depression L. Referrals and appointments - none  In addition, I have reviewed and discussed with patient certain preventive protocols, quality metrics, and best practice recommendations. A written personalized care plan for preventive services as well as general preventive health recommendations were provided to patient.  See attached scanned questionnaire for  additional information.   Signed,  Fabio Neighbors, LPN Nurse Health Advisor   Nurse Recommendations: None.

## 2018-03-28 NOTE — Assessment & Plan Note (Signed)
Ongoing issue with significant rash that is affecting quality of life Dermatology has thought this was likely eczema She is tried multiple treatments for eczema without any relief It does improve when she is on oral steroids She does have an upcoming appointment with rheumatology Do not believe she is undergone skin biopsy, so could consider this in the future Reassured patient that this is very unlikely to be related to parasitic infection, but will check O&P given her reaction to the biologic We will also check for any eosinophilia

## 2018-03-28 NOTE — Patient Instructions (Addendum)
[name] , Thank you for taking time to come for your Medicare Wellness Visit. I appreciate your ongoing commitment to your health goals. Please review the following plan we discussed and let me know if I can assist you in the future.   Screening recommendations/referrals: Colonoscopy: Up to date, cologuard due 05/2020 Mammogram: Up to date, next scheduled for 04/20/18. Bone Density: Up to date, due 05/2019 Recommended yearly ophthalmology/optometry visit for glaucoma screening and checkup Recommended yearly dental visit for hygiene and checkup  Vaccinations: Influenza vaccine: Up to date Pneumococcal vaccine: Completed series. Tdap vaccine: Up to date, due 05/2021 Shingles vaccine: Pt declines today.     Advanced directives: On file.   Conditions/risks identified: Exercise- recommend to exercise for 3 days a week for at least 30 minutes at a time.   Next appointment: 2:00 PM today with Dr Brita Romp.    Preventive Care 33 Years and Older, Female Preventive care refers to lifestyle choices and visits with your health care provider that can promote health and wellness. What does preventive care include?  A yearly physical exam. This is also called an annual well check.  Dental exams once or twice a year.  Routine eye exams. Ask your health care provider how often you should have your eyes checked.  Personal lifestyle choices, including:  Daily care of your teeth and gums.  Regular physical activity.  Eating a healthy diet.  Avoiding tobacco and drug use.  Limiting alcohol use.  Practicing safe sex.  Taking low-dose aspirin every day.  Taking vitamin and mineral supplements as recommended by your health care provider. What happens during an annual well check? The services and screenings done by your health care provider during your annual well check will depend on your age, overall health, lifestyle risk factors, and family history of disease. Counseling  Your  health care provider may ask you questions about your:  Alcohol use.  Tobacco use.  Drug use.  Emotional well-being.  Home and relationship well-being.  Sexual activity.  Eating habits.  History of falls.  Memory and ability to understand (cognition).  Work and work Statistician.  Reproductive health. Screening  You may have the following tests or measurements:  Height, weight, and BMI.  Blood pressure.  Lipid and cholesterol levels. These may be checked every 5 years, or more frequently if you are over 72 years old.  Skin check.  Lung cancer screening. You may have this screening every year starting at age 27 if you have a 30-pack-year history of smoking and currently smoke or have quit within the past 15 years.  Fecal occult blood test (FOBT) of the stool. You may have this test every year starting at age 85.  Flexible sigmoidoscopy or colonoscopy. You may have a sigmoidoscopy every 5 years or a colonoscopy every 10 years starting at age 69.  Hepatitis C blood test.  Hepatitis B blood test.  Sexually transmitted disease (STD) testing.  Diabetes screening. This is done by checking your blood sugar (glucose) after you have not eaten for a while (fasting). You may have this done every 1-3 years.  Bone density scan. This is done to screen for osteoporosis. You may have this done starting at age 92.  Mammogram. This may be done every 1-2 years. Talk to your health care provider about how often you should have regular mammograms. Talk with your health care provider about your test results, treatment options, and if necessary, the need for more tests. Vaccines  Your health care  provider may recommend certain vaccines, such as:  Influenza vaccine. This is recommended every year.  Tetanus, diphtheria, and acellular pertussis (Tdap, Td) vaccine. You may need a Td booster every 10 years.  Zoster vaccine. You may need this after age 47.  Pneumococcal 13-valent  conjugate (PCV13) vaccine. One dose is recommended after age 31.  Pneumococcal polysaccharide (PPSV23) vaccine. One dose is recommended after age 50. Talk to your health care provider about which screenings and vaccines you need and how often you need them. This information is not intended to replace advice given to you by your health care provider. Make sure you discuss any questions you have with your health care provider. Document Released: 05/29/2015 Document Revised: 01/20/2016 Document Reviewed: 03/03/2015 Elsevier Interactive Patient Education  2017 [place] Prevention in the Home Falls can cause injuries. They can happen to people of all ages. There are many things you can do to make your home safe and to help prevent falls. What can I do on the outside of my home?  Regularly fix the edges of walkways and driveways and fix any cracks.  Remove anything that might make you trip as you walk through a door, such as a raised step or threshold.  Trim any bushes or trees on the path to your home.  Use bright outdoor lighting.  Clear any walking paths of anything that might make someone trip, such as rocks or tools.  Regularly check to see if handrails are loose or broken. Make sure that both sides of any steps have handrails.  Any raised decks and porches should have guardrails on the edges.  Have any leaves, snow, or ice cleared regularly.  Use sand or salt on walking paths during winter.  Clean up any spills in your garage right away. This includes oil or grease spills. What can I do in the bathroom?  Use night lights.  Install grab bars by the toilet and in the tub and shower. Do not use towel bars as grab bars.  Use non-skid mats or decals in the tub or shower.  If you need to sit down in the shower, use a plastic, non-slip stool.  Keep the floor dry. Clean up any water that spills on the floor as soon as it happens.  Remove soap buildup in the tub or  shower regularly.  Attach bath mats securely with double-sided non-slip rug tape.  Do not have throw rugs and other things on the floor that can make you trip. What can I do in the bedroom?  Use night lights.  Make sure that you have a light by your bed that is easy to reach.  Do not use any sheets or blankets that are too big for your bed. They should not hang down onto the floor.  Have a firm chair that has side arms. You can use this for support while you get dressed.  Do not have throw rugs and other things on the floor that can make you trip. What can I do in the kitchen?  Clean up any spills right away.  Avoid walking on wet floors.  Keep items that you use a lot in easy-to-reach places.  If you need to reach something above you, use a strong step stool that has a grab bar.  Keep electrical cords out of the way.  Do not use floor polish or wax that makes floors slippery. If you must use wax, use non-skid floor wax.  Do not have throw  rugs and other things on the floor that can make you trip. What can I do with my stairs?  Do not leave any items on the stairs.  Make sure that there are handrails on both sides of the stairs and use them. Fix handrails that are broken or loose. Make sure that handrails are as long as the stairways.  Check any carpeting to make sure that it is firmly attached to the stairs. Fix any carpet that is loose or worn.  Avoid having throw rugs at the top or bottom of the stairs. If you do have throw rugs, attach them to the floor with carpet tape.  Make sure that you have a light switch at the top of the stairs and the bottom of the stairs. If you do not have them, ask someone to add them for you. What else can I do to help prevent falls?  Wear shoes that:  Do not have high heels.  Have rubber bottoms.  Are comfortable and fit you well.  Are closed at the toe. Do not wear sandals.  If you use a stepladder:  Make sure that it is fully  opened. Do not climb a closed stepladder.  Make sure that both sides of the stepladder are locked into place.  Ask someone to hold it for you, if possible.  Clearly mark and make sure that you can see:  Any grab bars or handrails.  First and last steps.  Where the edge of each step is.  Use tools that help you move around (mobility aids) if they are needed. These include:  Canes.  Walkers.  Scooters.  Crutches.  Turn on the lights when you go into a dark area. Replace any light bulbs as soon as they burn out.  Set up your furniture so you have a clear path. Avoid moving your furniture around.  If any of your floors are uneven, fix them.  If there are any pets around you, be aware of where they are.  Review your medicines with your doctor. Some medicines can make you feel dizzy. This can increase your chance of falling. Ask your doctor what other things that you can do to help prevent falls. This information is not intended to replace advice given to you by your health care provider. Make sure you discuss any questions you have with your health care provider. Document Released: 02/26/2009 Document Revised: 10/08/2015 Document Reviewed: 06/06/2014 Elsevier Interactive Patient Education  2017 [employer].

## 2018-03-28 NOTE — Assessment & Plan Note (Signed)
Recheck LFTs 

## 2018-03-28 NOTE — Progress Notes (Signed)
Cheryl Hays: Cheryl Hays, Female    DOB: 09-03-1945, 72 y.o.   MRN: [id number] Visit Date: 03/28/2018  Today's Provider: Lavon Paganini, MD   Chief Complaint  Cheryl Hays presents with  . Hyperlipidemia   Subjective:   Cheryl Hays had a AWE prior to appointment today   Lipid/Cholesterol, Follow-up:   Last seen for this 6 months ago.  Management changes since that visit include continue Atorvastatin 20 mg.  Last Lipid Panel: Labs(Brief)   Lab Results  Component Value Date   CHOL 127 06/19/2017   HDL 55 06/19/2017   LDLCALC 60 06/19/2017   TRIG 62 06/19/2017   CHOLHDL 2.3 06/19/2017    Risk factors for vascular disease include hypercholesterolemia  Cheryl Hays reports good compliance with treatment. Cheryl Hays is not having side effects.  Weight trend: stable Current diet: eating less now since Cheryl Hays is not as active, limited wine Current exercise: walking      Wt Readings from Last 3 Encounters:  03/28/18 133 lb 12.8 oz (60.7 kg)  03/28/18 135 lb 3.2 oz (61.3 kg)  03/18/18 134 lb (60.8 kg)    Still has a rash.  Seeing Dermatology.  Thought to be Eczema.  Started taking dupixent that caused significant drug rash and allergic reaction.  Cheryl Hays has upcoming appt with Rheumatology.  This has been ongoing for 1.5 yrs ago.  Cheryl Hays has been thinking about potential causes.  Cheryl Hays was in [place] just prior to this beginning. When Cheryl Hays read about Dupixent, it mentioned a contraindication for parasitic infections.  Cheryl Hays denies diarrhea, blood in stool, or other symptoms.  Cheryl Hays has had some weight loss.  Cheryl Hays has also tried [place] and multiple Steroid creams -------------------------------------------------------------------  Review of Systems  Constitutional: Negative.   HENT: Negative.   Eyes: Negative.   Respiratory: Negative.   Cardiovascular: Negative.   Gastrointestinal: Negative.   Endocrine: Negative.   Genitourinary: Negative.   Musculoskeletal: Negative.   Skin: Positive for  rash.  Allergic/Immunologic: Negative.   Neurological: Negative.   Hematological: Bruises/bleeds easily.  Psychiatric/Behavioral: Negative.     Social History   Socioeconomic History  . Marital status: Widowed    Spouse name: Not on file  . Number of children: 2  . Years of education: 39  . Highest education level: Some college, no degree  Occupational History  . Occupation: retired  Scientific laboratory technician  . Financial resource strain: Not hard at all  . Food insecurity:    Worry: Never true    Inability: Never true  . Transportation needs:    Medical: No    Non-medical: No  Tobacco Use  . Smoking status: Former Smoker    Last attempt to quit: 05/16/1975    Years since quitting: 42.8  . Smokeless tobacco: Never Used  Substance and Sexual Activity  . Alcohol use: Yes    Comment: a couple drink monthly  . Drug use: No  . Sexual activity: Not on file  Lifestyle  . Physical activity:    Days per week: 0 days    Minutes per session: 0 min  . Stress: Not at all  Relationships  . Social connections:    Talks on phone: Cheryl Hays refused    Gets together: Cheryl Hays refused    Attends religious service: Cheryl Hays refused    Active member of club or organization: Cheryl Hays refused    Attends meetings of clubs or organizations: Cheryl Hays refused    Relationship status: Cheryl Hays refused  . Intimate partner violence:    Fear  of current or ex partner: Cheryl Hays refused    Emotionally abused: Cheryl Hays refused    Physically abused: Cheryl Hays refused    Forced sexual activity: Cheryl Hays refused  Other Topics Concern  . Not on file  Social History Narrative  . Not on file    Past Medical History:  Diagnosis Date  . Hyperlipidemia      Cheryl Hays Active Problem List   Diagnosis Date Noted  . Eczema 06/16/2017  . Snoring 03/27/2017  . Asthma 10/31/2014  . Abnormal LFTs 10/31/2014  . Blood glucose elevated 10/31/2014  . HLD (hyperlipidemia) 10/31/2014  . OP (osteoporosis) 10/31/2014  . Allergic  rhinitis, seasonal 10/31/2014    Past Surgical History:  Procedure Laterality Date  . BREAST SURGERY Left 2000   biopsy  . CESAREAN SECTION  05/08/1978, 07/03/1979  . DILATION AND CURETTAGE OF UTERUS  1993  . FACIAL COSMETIC SURGERY  2006  . SKIN SURGERY Right    hand 02/2016 x's 2 Dr. Nehemiah Massed    Cheryl Hays family history includes Bladder Cancer in Cheryl Hays brother; Heart murmur in Cheryl Hays mother; Vision loss in Cheryl Hays father.      Current Outpatient Medications:  .  albuterol (PROVENTIL HFA;VENTOLIN HFA) 108 (90 Base) MCG/ACT inhaler, Inhale 2 puffs into the lungs every 6 (six) hours as needed for wheezing or shortness of breath., Disp: 1 Inhaler, Rfl: 3 .  atorvastatin (LIPITOR) 20 MG tablet, TAKE 1 TABLET(20 MG) BY MOUTH DAILY, Disp: 30 tablet, Rfl: 11 .  Azelastine HCl 0.15 % SOLN, Place 1 spray into the nose 2 (two) times daily., Disp: 30 mL, Rfl: 3 .  Calcium Carb-Cholecalciferol (CALCIUM 600+D3) 600-800 MG-UNIT TABS, Take 1 tablet by mouth daily., Disp: , Rfl:  .  diphenhydrAMINE (BENADRYL) 25 MG tablet, Take 25 mg by mouth at bedtime as needed., Disp: , Rfl:  .  famotidine (PEPCID) 20 MG tablet, Take 1 tablet (20 mg total) by mouth 2 (two) times daily., Disp: 60 tablet, Rfl: 1 .  hydrOXYzine (ATARAX/VISTARIL) 10 MG tablet, TK 1 TO 3 TS PO QHS PRF ITCH UTD, Disp: , Rfl: 0 .  meloxicam (MOBIC) 7.5 MG tablet, Take 1 tablet (7.5 mg total) by mouth daily., Disp: 90 tablet, Rfl: 1 .  montelukast (SINGULAIR) 10 MG tablet, Take 1 tablet (10 mg total) by mouth daily., Disp: 90 tablet, Rfl: 3 .  SYMBICORT 80-4.5 MCG/ACT inhaler, INHALE 2 PUFFS INTO THE LUNGS TWICE DAILY, Disp: 10.2 g, Rfl: 5 .  triamcinolone cream (KENALOG) 0.1 %, Apply 1 application 2 (two) times daily topically. , Disp: , Rfl: 0  Cheryl Hays Care Team: Bacigalupo, Dionne Bucy, MD as PCP - General (Family Medicine) Marlyn Corporal Clearnce Sorrel, PA-C as Physician Assistant (Family Medicine) Ralene Bathe, MD as Consulting Physician  (Dermatology) Eulogio Bear, MD as Consulting Physician (Ophthalmology)     Objective:   Vitals: BP 131/80 (BP Location: Left Arm, Cheryl Hays Position: Sitting, Cuff Size: Normal)   Pulse 71   Temp 98.2 F (36.8 C) (Oral)   Ht 4\' 11"  (1.499 m)   Wt 133 lb 12.8 oz (60.7 kg)   SpO2 99%   BMI 27.02 kg/m   Physical Exam  Constitutional: Cheryl Hays is oriented to person, place, and time. Cheryl Hays appears well-developed and well-nourished. No distress.  HENT:  Head: Normocephalic and atraumatic.  Right Ear: External ear normal.  Left Ear: External ear normal.  Nose: Nose normal.  Mouth/Throat: Oropharynx is clear and moist.  Eyes: Pupils are equal, round, and reactive to light. Conjunctivae and  EOM are normal. No scleral icterus.  Neck: Neck supple. No thyromegaly present.  Cardiovascular: Normal rate, regular rhythm, normal heart sounds and intact distal pulses.  No murmur heard. Pulmonary/Chest: Effort normal and breath sounds normal. No respiratory distress. Cheryl Hays has no wheezes. Cheryl Hays has no rales.  Abdominal: Soft. Bowel sounds are normal. Cheryl Hays exhibits no distension. There is no tenderness. There is no rebound and no guarding.  Musculoskeletal: Cheryl Hays exhibits no edema or deformity.  Lymphadenopathy:    Cheryl Hays has no cervical adenopathy.  Neurological: Cheryl Hays is alert and oriented to person, place, and time.  Skin: Skin is warm and dry. Capillary refill takes less than 2 seconds. Rash (diffuse, erythematous, maculopapular rash over b/l LEs and trunk) noted.  Psychiatric: Cheryl Hays has a normal mood and affect. Cheryl Hays behavior is normal.  Vitals reviewed.   Activities of Daily Living In your present state of health, do you have any difficulty performing the following activities: 03/28/2018  Hearing? N  Vision? N  Difficulty concentrating or making decisions? N  Walking or climbing stairs? N  Dressing or bathing? N  Doing errands, shopping? N  Preparing Food and eating ? N  Using the Toilet? N  In the  past six months, have you accidently leaked urine? N  Do you have problems with loss of bowel control? N  Managing your Medications? N  Managing your Finances? N  Housekeeping or managing your Housekeeping? N  Some recent data might be hidden    Fall Risk Assessment Fall Risk  03/28/2018 03/27/2017 03/23/2016 03/23/2015  Falls in the past year? 0 No No No     Depression Screen PHQ 2/9 Scores 03/28/2018 03/27/2017 03/27/2017 03/23/2016  PHQ - 2 Score 0 0 0 0  PHQ- 9 Score - 0 - -     Assessment & Plan:    Problem List Items Addressed This Visit      Musculoskeletal and Integument   Rash and nonspecific skin eruption    Ongoing issue with significant rash that is affecting quality of life Dermatology has thought this was likely eczema Cheryl Hays is tried multiple treatments for eczema without any relief It does improve when Cheryl Hays is on oral steroids Cheryl Hays does have an upcoming appointment with rheumatology Do not believe Cheryl Hays is undergone skin biopsy, so could consider this in the future Reassured Cheryl Hays that this is very unlikely to be related to parasitic infection, but will check O&P given Cheryl Hays reaction to the biologic We will also check for any eosinophilia      Relevant Orders   Ova and parasite examination   CBC w/Diff/Platelet     Other   Abnormal LFTs    Recheck LFTs      Relevant Orders   Comprehensive metabolic panel   Blood glucose elevated    Recheck A1c      Relevant Orders   Hemoglobin A1c   HLD (hyperlipidemia) - Primary    Tolerating Cheryl Hays statin well Reassured Cheryl Hays that we did see a large reduction in LDL after starting atorvastatin Recheck fasting lipid panel and CMP today      Relevant Orders   Lipid panel   Comprehensive metabolic panel    Other Visit Diagnoses    High risk medication use       Relevant Orders   Ova and parasite examination   CBC w/Diff/Platelet   Recent foreign travel       Relevant Orders   Ova and parasite examination   CBC  w/Diff/Platelet  Return in about 1 year (around 03/29/2019) for chronic disease f/u with [facility].   The entirety of the information documented in the History of Present Illness, Review of Systems and Physical Exam were personally obtained by me. Portions of this information were initially documented by Tiburcio Pea, CMA and reviewed by me for thoroughness and accuracy.    Virginia Crews, MD, MPH [facility] 03/28/2018 2:43 PM

## 2018-03-29 ENCOUNTER — Telehealth: Payer: Self-pay

## 2018-03-29 LAB — CBC WITH DIFFERENTIAL/PLATELET
BASOS ABS: 0 10*3/uL (ref 0.0–0.2)
Basos: 0 %
EOS (ABSOLUTE): 0.7 10*3/uL — ABNORMAL HIGH (ref 0.0–0.4)
EOS: 7 %
HEMATOCRIT: 44.2 % (ref 34.0–46.6)
HEMOGLOBIN: 14.5 g/dL (ref 11.1–15.9)
IMMATURE GRANULOCYTES: 0 %
Immature Grans (Abs): 0 10*3/uL (ref 0.0–0.1)
Lymphocytes Absolute: 2.5 10*3/uL (ref 0.7–3.1)
Lymphs: 25 %
MCH: 28 pg (ref 26.6–33.0)
MCHC: 32.8 g/dL (ref 31.5–35.7)
MCV: 85 fL (ref 79–97)
MONOCYTES: 9 %
Monocytes Absolute: 0.9 10*3/uL (ref 0.1–0.9)
NEUTROS PCT: 59 %
Neutrophils Absolute: 5.7 10*3/uL (ref 1.4–7.0)
Platelets: 340 10*3/uL (ref 150–450)
RBC: 5.18 x10E6/uL (ref 3.77–5.28)
RDW: 13 % (ref 12.3–15.4)
WBC: 9.8 10*3/uL (ref 3.4–10.8)

## 2018-03-29 LAB — LIPID PANEL
CHOL/HDL RATIO: 2.5 ratio (ref 0.0–4.4)
Cholesterol, Total: 162 mg/dL (ref 100–199)
HDL: 66 mg/dL (ref >39)
LDL CALC: 70 mg/dL (ref 0–99)
Triglycerides: 132 mg/dL (ref 0–149)
VLDL CHOLESTEROL CAL: 26 mg/dL (ref 5–40)

## 2018-03-29 LAB — COMPREHENSIVE METABOLIC PANEL
ALT: 47 IU/L — AB (ref 0–32)
AST: 29 IU/L (ref 0–40)
Albumin/Globulin Ratio: 1.6 (ref 1.2–2.2)
Albumin: 4.2 g/dL (ref 3.5–4.8)
Alkaline Phosphatase: 178 IU/L — ABNORMAL HIGH (ref 39–117)
BUN/Creatinine Ratio: 20 (ref 12–28)
BUN: 15 mg/dL (ref 8–27)
Bilirubin Total: 0.8 mg/dL (ref 0.0–1.2)
CO2: 25 mmol/L (ref 20–29)
CREATININE: 0.74 mg/dL (ref 0.57–1.00)
Calcium: 9.5 mg/dL (ref 8.7–10.3)
Chloride: 100 mmol/L (ref 96–106)
GFR calc Af Amer: 93 mL/min/{1.73_m2} (ref >59)
GFR calc non Af Amer: 81 mL/min/{1.73_m2} (ref >59)
GLUCOSE: 91 mg/dL (ref 65–99)
Globulin, Total: 2.6 g/dL (ref 1.5–4.5)
Potassium: 4.3 mmol/L (ref 3.5–5.2)
Sodium: 141 mmol/L (ref 134–144)
Total Protein: 6.8 g/dL (ref 6.0–8.5)

## 2018-03-29 LAB — HEMOGLOBIN A1C
Est. average glucose Bld gHb Est-mCnc: 117 mg/dL
HEMOGLOBIN A1C: 5.7 % — AB (ref 4.8–5.6)

## 2018-03-29 NOTE — Telephone Encounter (Signed)
-----   Message from Virginia Crews, MD sent at 03/29/2018  8:33 AM EST ----- Labs are stable.  Hemoglobin A1c, 3 month average of blood sugars, has increased into the pre-diabetes range, however.  Recommend low carb diet and regular exercise - 30 min.day at least 5 times per week.  Virginia Crews, MD, MPH [facility] 03/29/2018 8:33 AM

## 2018-03-29 NOTE — Telephone Encounter (Signed)
LMTCB

## 2018-04-02 NOTE — Telephone Encounter (Signed)
LMOVM for pt to return call 

## 2018-04-03 ENCOUNTER — Ambulatory Visit: Payer: Self-pay | Admitting: Podiatry

## 2018-04-04 ENCOUNTER — Encounter: Payer: Self-pay | Admitting: Family Medicine

## 2018-04-04 NOTE — Telephone Encounter (Signed)
LVMTCB

## 2018-04-05 ENCOUNTER — Encounter: Payer: Self-pay | Admitting: Podiatry

## 2018-04-05 ENCOUNTER — Ambulatory Visit (INDEPENDENT_AMBULATORY_CARE_PROVIDER_SITE_OTHER): Payer: Medicare Other | Admitting: Podiatry

## 2018-04-05 VITALS — BP 121/63 | HR 88

## 2018-04-05 DIAGNOSIS — Q828 Other specified congenital malformations of skin: Secondary | ICD-10-CM

## 2018-04-05 DIAGNOSIS — M216X2 Other acquired deformities of left foot: Secondary | ICD-10-CM

## 2018-04-05 DIAGNOSIS — L608 Other nail disorders: Secondary | ICD-10-CM

## 2018-04-05 DIAGNOSIS — Z79899 Other long term (current) drug therapy: Secondary | ICD-10-CM | POA: Diagnosis not present

## 2018-04-05 DIAGNOSIS — Z789 Other specified health status: Secondary | ICD-10-CM | POA: Diagnosis not present

## 2018-04-05 DIAGNOSIS — R21 Rash and other nonspecific skin eruption: Secondary | ICD-10-CM | POA: Diagnosis not present

## 2018-04-05 NOTE — Progress Notes (Signed)
This patient presents to the office with two concerns about her feet.  She says she has a painful callus under the outside ball of her left foot.  She says this is painful walking and wearing her shoes.  She says she has been cutting the callus herself but she presents to the office today for an evaluation.  She also says she is having her big toenail are sticking up and hitting her shoes.  She presents for evaluation and treatment.  General Appearance  Alert, conversant and in no acute stress.  Vascular  Dorsalis pedis and posterior tibial  pulses are palpable  bilaterally.  Capillary return is within normal limits  bilaterally. Temperature is within normal limits  bilaterally.  Neurologic  Senn-Weinstein monofilament wire test within normal limits  bilaterally. Muscle power within normal limits bilaterally.  Nails Pincer hallux toenails noted. No evidence of bacterial infection or drainage bilaterally.  Orthopedic  No limitations of motion  feet .  No crepitus or effusions noted.  No bony pathology or digital deformities noted. Plantar flexed fifth metatarsal left foot.  Skin  normotropic skin with  noted bilaterally.  No signs of infections or ulcers noted.  Porokeratosis noted sub 5th metatarsal left foot.  Pincer Nails  B/L  Porokeratosis sub 5th met left foot.  Initial exam.  Debridement of porokeratosis, sub-fifth, left foot.  Discussed the presence of pincer toenail formation great toes bilateral.  Return to the clinic when necessary   Gardiner Barefoot DPM

## 2018-04-05 NOTE — Telephone Encounter (Signed)
Patient advised.

## 2018-04-09 ENCOUNTER — Telehealth: Payer: Self-pay | Admitting: Family Medicine

## 2018-04-09 NOTE — Telephone Encounter (Signed)
Pt returned missed call. Please call pt back if needed.  Thanks, [employer]

## 2018-04-10 LAB — OVA AND PARASITE EXAMINATION

## 2018-04-13 NOTE — Telephone Encounter (Signed)
Advised  ED 

## 2018-04-16 ENCOUNTER — Telehealth: Payer: Self-pay

## 2018-04-16 DIAGNOSIS — R21 Rash and other nonspecific skin eruption: Secondary | ICD-10-CM | POA: Diagnosis not present

## 2018-04-16 DIAGNOSIS — R945 Abnormal results of liver function studies: Secondary | ICD-10-CM | POA: Diagnosis not present

## 2018-04-16 DIAGNOSIS — L299 Pruritus, unspecified: Secondary | ICD-10-CM | POA: Diagnosis not present

## 2018-04-16 NOTE — Telephone Encounter (Signed)
-----   Message from Virginia Crews, MD sent at 04/16/2018  9:47 AM EST ----- Negative for any parasites

## 2018-04-16 NOTE — Telephone Encounter (Signed)
Left patient a message advising her that results have been released to My Chart and to call back if she has any further questions or concerns.

## 2018-04-17 ENCOUNTER — Other Ambulatory Visit: Payer: Self-pay | Admitting: Rheumatology

## 2018-04-17 DIAGNOSIS — R945 Abnormal results of liver function studies: Principal | ICD-10-CM

## 2018-04-17 DIAGNOSIS — R7989 Other specified abnormal findings of blood chemistry: Secondary | ICD-10-CM

## 2018-04-19 ENCOUNTER — Ambulatory Visit
Admission: RE | Admit: 2018-04-19 | Discharge: 2018-04-19 | Disposition: A | Discharge status: Home / Self Care | Payer: Medicare Other | Source: Ambulatory Visit | Attending: Rheumatology | Admitting: Rheumatology

## 2018-04-19 DIAGNOSIS — R945 Abnormal results of liver function studies: Secondary | ICD-10-CM | POA: Insufficient documentation

## 2018-04-19 DIAGNOSIS — R21 Rash and other nonspecific skin eruption: Secondary | ICD-10-CM | POA: Diagnosis not present

## 2018-04-19 DIAGNOSIS — R7989 Other specified abnormal findings of blood chemistry: Secondary | ICD-10-CM

## 2018-04-20 ENCOUNTER — Ambulatory Visit
Admission: RE | Admit: 2018-04-20 | Discharge: 2018-04-20 | Disposition: A | Discharge status: Home / Self Care | Payer: Medicare Other | Source: Ambulatory Visit | Attending: Family Medicine | Admitting: Family Medicine

## 2018-04-20 DIAGNOSIS — Z1231 Encounter for screening mammogram for malignant neoplasm of breast: Secondary | ICD-10-CM | POA: Diagnosis not present

## 2018-04-23 ENCOUNTER — Encounter: Payer: Self-pay | Admitting: Family Medicine

## 2018-05-12 ENCOUNTER — Encounter: Payer: Self-pay | Admitting: Family Medicine

## 2018-05-15 ENCOUNTER — Other Ambulatory Visit: Payer: Self-pay

## 2018-05-15 ENCOUNTER — Other Ambulatory Visit: Payer: Self-pay | Admitting: Family Medicine

## 2018-05-15 DIAGNOSIS — R748 Abnormal levels of other serum enzymes: Secondary | ICD-10-CM

## 2018-05-16 LAB — HEPATIC FUNCTION PANEL
ALT: 30 IU/L (ref 0–32)
AST: 24 IU/L (ref 0–40)
Albumin: 4.3 g/dL (ref 3.5–4.8)
Alkaline Phosphatase: 169 IU/L — ABNORMAL HIGH (ref 39–117)
BILIRUBIN TOTAL: 0.7 mg/dL (ref 0.0–1.2)
Bilirubin, Direct: 0.15 mg/dL (ref 0.00–0.40)
Total Protein: 6.6 g/dL (ref 6.0–8.5)

## 2018-05-17 ENCOUNTER — Telehealth: Payer: Self-pay

## 2018-05-17 NOTE — Telephone Encounter (Signed)
-----   Message from Trinna Post, [place] sent at 05/17/2018 10:30 AM EST ----- ALT has normalized since stopping medication but alkaline phosphatase which can sometimes also be related to the liver has held steady. Please tell patient I will forward this to Dr. B for her to review if she would like to recommend alternative statin dosing or further follow up.

## 2018-05-17 NOTE — Telephone Encounter (Signed)
Patient advised as directed below.  Thanks,  -Rosario Duey 

## 2018-05-21 ENCOUNTER — Encounter: Payer: Self-pay | Admitting: Family Medicine

## 2018-05-22 ENCOUNTER — Encounter: Payer: Self-pay | Admitting: Family Medicine

## 2018-05-24 ENCOUNTER — Encounter: Payer: Self-pay | Admitting: Family Medicine

## 2018-05-24 DIAGNOSIS — L821 Other seborrheic keratosis: Secondary | ICD-10-CM | POA: Diagnosis not present

## 2018-05-24 DIAGNOSIS — D485 Neoplasm of uncertain behavior of skin: Secondary | ICD-10-CM | POA: Diagnosis not present

## 2018-05-24 DIAGNOSIS — L578 Other skin changes due to chronic exposure to nonionizing radiation: Secondary | ICD-10-CM | POA: Diagnosis not present

## 2018-05-24 DIAGNOSIS — L298 Other pruritus: Secondary | ICD-10-CM | POA: Diagnosis not present

## 2018-05-24 DIAGNOSIS — L858 Other specified epidermal thickening: Secondary | ICD-10-CM | POA: Diagnosis not present

## 2018-05-24 DIAGNOSIS — L814 Other melanin hyperpigmentation: Secondary | ICD-10-CM | POA: Diagnosis not present

## 2018-05-25 ENCOUNTER — Telehealth: Payer: Self-pay

## 2018-05-25 DIAGNOSIS — R945 Abnormal results of liver function studies: Secondary | ICD-10-CM

## 2018-05-25 DIAGNOSIS — R7989 Other specified abnormal findings of blood chemistry: Secondary | ICD-10-CM

## 2018-05-25 NOTE — Telephone Encounter (Signed)
Requesting Dr. B call back Dr. Marolyn Hammock (from [facility]) about the patient. [phone]. Thanks!

## 2018-05-25 NOTE — Telephone Encounter (Signed)
Returned call to Dr. Edmonia Lynch from [facility] dermatology.  We spoke about this mutual patient who has had more than 1 year of persistent itching.  She has been seen by a local dermatologist who did a skin biopsy and believes that she had eczema.  Dr. Edmonia Lynch believes that what was seen on biopsy could also relate to inflammation from scratching.  He believes that her scratching is causing her rash and the itching is the primary symptom other than the rash, like a neurodermatitis.  Given her persistent elevation of alkaline phosphatase, he believes this may be a cholestatic process, despite normal bilirubin.  He believes that a referral to a gastroenterologist for further work-up of this mild persistent alk phos would be the next best step for the patient.  He believes that since she did not get better and her labs did not improve during her trial off of Lipitor that it is reasonable to restart this.  He may try a low-dose of naltrexone to help with the itching.  I will place referral to GI today.  Please let the patient know what we have discussed and to expect a call from the gastroenterologist regarding an appointment for further work-up.

## 2018-05-25 NOTE — Telephone Encounter (Signed)
Patient advised.

## 2018-05-28 ENCOUNTER — Encounter: Payer: Self-pay | Admitting: *Deleted

## 2018-06-27 DIAGNOSIS — R21 Rash and other nonspecific skin eruption: Secondary | ICD-10-CM | POA: Diagnosis not present

## 2018-06-27 DIAGNOSIS — L821 Other seborrheic keratosis: Secondary | ICD-10-CM | POA: Diagnosis not present

## 2018-06-27 DIAGNOSIS — L578 Other skin changes due to chronic exposure to nonionizing radiation: Secondary | ICD-10-CM | POA: Diagnosis not present

## 2018-06-27 DIAGNOSIS — L308 Other specified dermatitis: Secondary | ICD-10-CM | POA: Diagnosis not present

## 2018-06-27 DIAGNOSIS — Z85828 Personal history of other malignant neoplasm of skin: Secondary | ICD-10-CM | POA: Diagnosis not present

## 2018-06-27 DIAGNOSIS — L814 Other melanin hyperpigmentation: Secondary | ICD-10-CM | POA: Diagnosis not present

## 2018-06-27 DIAGNOSIS — L298 Other pruritus: Secondary | ICD-10-CM | POA: Diagnosis not present

## 2018-07-05 ENCOUNTER — Other Ambulatory Visit
Admission: RE | Admit: 2018-07-05 | Discharge: 2018-07-05 | Disposition: A | Discharge status: Home / Self Care | Payer: Medicare Other | Attending: Gastroenterology | Admitting: Gastroenterology

## 2018-07-05 ENCOUNTER — Encounter: Payer: Self-pay | Admitting: Gastroenterology

## 2018-07-05 ENCOUNTER — Other Ambulatory Visit: Payer: Self-pay

## 2018-07-05 ENCOUNTER — Ambulatory Visit (INDEPENDENT_AMBULATORY_CARE_PROVIDER_SITE_OTHER): Payer: Medicare Other | Admitting: Gastroenterology

## 2018-07-05 VITALS — BP 143/85 | HR 89 | Ht 59.0 in | Wt 140.6 lb

## 2018-07-05 DIAGNOSIS — R7989 Other specified abnormal findings of blood chemistry: Secondary | ICD-10-CM

## 2018-07-05 DIAGNOSIS — R945 Abnormal results of liver function studies: Secondary | ICD-10-CM

## 2018-07-05 LAB — HEPATIC FUNCTION PANEL
ALT: 29 U/L (ref 0–44)
AST: 27 U/L (ref 15–41)
Albumin: 4.3 g/dL (ref 3.5–5.0)
Alkaline Phosphatase: 147 U/L — ABNORMAL HIGH (ref 38–126)
Bilirubin, Direct: 0.1 mg/dL (ref 0.0–0.2)
Indirect Bilirubin: 0.6 mg/dL (ref 0.3–0.9)
Total Bilirubin: 0.7 mg/dL (ref 0.3–1.2)
Total Protein: 7.5 g/dL (ref 6.5–8.1)

## 2018-07-05 NOTE — Progress Notes (Signed)
Gastroenterology Consultation  Referring Provider:     Virginia Crews, MD Primary Care Physician:  Cheryl Crews, MD Primary Gastroenterologist:  Dr. Allen Norris     Reason for Consultation:     Elevated alkaline phosphatase        HPI:   Cheryl Hays is a 73 y.o. y/o female referred for consultation & management of elevated alkaline phosphatase by Dr. Brita Romp, Dionne Bucy, MD.  This patient comes in today with a history of chronically elevated alkaline phosphatase for the last year.  The patient's labs did show an isolated lesion of ALT in November but it returned back to normal in December.  The patient had a CT scan of the abdomen in 2010 looking for pancreatic lesion but none was seen.  The patient does have an elevated GGT and has also had an ultrasound that did not show any increased echogenicity suggestive of fatty liver.  The patient was being treated with Lipitor and this was stopped without any improvement in her labs.  The patient reports that she was seen in [place] for the same reason about a year ago and they reported that her alk phosphatase was not high enough at that time to investigate and that it may be her normal.  She reports that she has a rash with itching that has been going on for some time with biopsy showing eczema.  Past Medical History:  Diagnosis Date  . Hyperlipidemia     Past Surgical History:  Procedure Laterality Date  . BREAST SURGERY Left 2000   biopsy  . CESAREAN SECTION  05/08/1978, 07/03/1979  . DILATION AND CURETTAGE OF UTERUS  1993  . FACIAL COSMETIC SURGERY  2006  . SKIN SURGERY Right    hand 02/2016 x's 2 Dr. Nehemiah Massed    Prior to Admission medications   Medication Sig Start Date End Date Taking? Authorizing Provider  albuterol (PROVENTIL HFA;VENTOLIN HFA) 108 (90 Base) MCG/ACT inhaler Inhale 2 puffs into the lungs every 6 (six) hours as needed for wheezing or shortness of breath. 06/14/16   Carmon Ginsberg, PA  atorvastatin  (LIPITOR) 20 MG tablet TAKE 1 TABLET(20 MG) BY MOUTH DAILY 10/05/17   Bacigalupo, Dionne Bucy, MD  Azelastine HCl 0.15 % SOLN Place 1 spray into the nose 2 (two) times daily. 06/14/16   Carmon Ginsberg, PA  Calcium Carb-Cholecalciferol (CALCIUM 600+D3) 600-800 MG-UNIT TABS Take 1 tablet by mouth daily.    [provider]  diphenhydrAMINE (BENADRYL) 25 MG tablet Take 25 mg by mouth at bedtime as needed.    [provider]  famotidine (PEPCID) 20 MG tablet Take 1 tablet (20 mg total) by mouth 2 (two) times daily. 03/18/18 03/18/19  Triplett, Johnette Abraham B, FNP  hydrOXYzine (ATARAX/VISTARIL) 10 MG tablet Take 1 tablet (10 mg total) by mouth every 8 (eight) hours as needed for itching. 03/28/18   Cheryl Crews, MD  meloxicam (MOBIC) 7.5 MG tablet Take 1 tablet (7.5 mg total) by mouth daily. 03/23/15   Margarita Rana, MD  montelukast (SINGULAIR) 10 MG tablet Take 1 tablet (10 mg total) by mouth daily. 08/03/17   Cheryl Crews, MD  mycophenolate (CELLCEPT) 250 MG capsule TK 1 C PO BID FOR ITCHING 05/24/18   [provider]  naltrexone (DEPADE) 50 MG tablet  05/30/18   [provider]  predniSONE (DELTASONE) 20 MG tablet  05/24/18   [provider]  SYMBICORT 80-4.5 MCG/ACT inhaler INHALE 2 PUFFS INTO THE LUNGS TWICE DAILY 10/12/16  Carmon Ginsberg, PA  triamcinolone cream (KENALOG) 0.1 % Apply 1 application 2 (two) times daily topically.  01/25/17   [provider]    Family History  Problem Relation Age of Onset  . Heart murmur Mother   . Vision loss Father   . Bladder Cancer Brother      Social History   Tobacco Use  . Smoking status: Former Smoker    Last attempt to quit: 05/16/1975    Years since quitting: 43.1  . Smokeless tobacco: Never Used  Substance Use Topics  . Alcohol use: Yes    Comment: a couple drink monthly  . Drug use: No    Allergies as of 07/05/2018 - Review Complete 04/05/2018  Allergen Reaction Noted  . Celecoxib   10/31/2014  . Levofloxacin Other (See Comments) 03/23/2016  . Sulfa antibiotics  10/31/2014  . Pseudoephedrine Rash 10/31/2014    Review of Systems:    All systems reviewed and negative except where noted in HPI.   Physical Exam:  There were no vitals taken for this visit. No LMP recorded. Patient is postmenopausal. General:   Alert,  Well-developed, well-nourished, pleasant and cooperative in NAD Head:  Normocephalic and atraumatic. Eyes:  Sclera clear, no icterus.   Conjunctiva pink. Ears:  Normal auditory acuity. Nose:  No deformity, discharge, or lesions. Mouth:  No deformity or lesions,oropharynx pink & moist. Neck:  Supple; no masses or thyromegaly. Lungs:  Respirations even and unlabored.  Clear throughout to auscultation.   No wheezes, crackles, or rhonchi. No acute distress. Heart:  Regular rate and rhythm; no murmurs, clicks, rubs, or gallops. Abdomen:  Normal bowel sounds.  No bruits.  Soft, non-tender and non-distended without masses, hepatosplenomegaly or hernias noted.  No guarding or rebound tenderness.  Negative Carnett sign.   Rectal:  Deferred.  Msk:  Symmetrical without gross deformities.  Good, equal movement & strength bilaterally. Pulses:  Normal pulses noted. Extremities:  No clubbing or edema.  No cyanosis. Neurologic:  Alert and oriented x3;  grossly normal neurologically. Skin:  Intact without significant lesions or rashes.  No jaundice. Lymph Nodes:  No significant cervical adenopathy. Psych:  Alert and cooperative. Normal mood and affect.  Imaging Studies: No results found.  Assessment and Plan:   Cheryl Hays is a 73 y.o. y/o female who comes in with an isolated increase in alkaline phosphatase for the last year.  The patient was on Lipitor which was stopped and the alkaline phosphatase did not come back to normal levels.  The patient also has a elevated GGT at 85.  The patient will have her labs checked for antimitochondrial antibody and for  fractionation of the alkaline phosphatase to see if there is a non-liver cause of the elevated alkaline phosphatase. She will also have her LFTs rechecked. The patient has been explained the plan and agrees with it.  Cheryl Hays Lame, MD. Marval Regal    Note: This dictation was prepared with Dragon dictation along with smaller phrase technology. Any transcriptional errors that result from this process are unintentional.

## 2018-07-10 LAB — MITOCHONDRIAL ANTIBODIES: Mitochondrial M2 Ab, IgG: 2.6 Units (ref 0.0–20.0)

## 2018-07-11 LAB — ALKALINE PHOSPHATASE, ISOENZYMES
Alk Phos Bone Fract: 26 % (ref 14–68)
Alk Phos Liver Fract: 68 % (ref 18–85)
Alk Phos: 153 IU/L — ABNORMAL HIGH (ref 39–117)
Intestinal %: 6 % (ref 0–18)

## 2018-07-16 DIAGNOSIS — L299 Pruritus, unspecified: Secondary | ICD-10-CM | POA: Diagnosis not present

## 2018-07-18 ENCOUNTER — Telehealth: Payer: Self-pay | Admitting: Gastroenterology

## 2018-07-18 DIAGNOSIS — L299 Pruritus, unspecified: Secondary | ICD-10-CM | POA: Diagnosis not present

## 2018-07-18 NOTE — Telephone Encounter (Signed)
Patient called and  wants to go ahead an schedule the Liver BX. She would also like to know what all to expect.

## 2018-07-20 NOTE — Telephone Encounter (Signed)
Contacted pt and informed her I would go ahead and fax the order for the liver biopsy. Advised pt I will have them schedule appt after March 20th as pt will be out of town.

## 2018-07-23 ENCOUNTER — Other Ambulatory Visit: Payer: Self-pay

## 2018-07-23 DIAGNOSIS — R7989 Other specified abnormal findings of blood chemistry: Secondary | ICD-10-CM

## 2018-07-23 DIAGNOSIS — L299 Pruritus, unspecified: Secondary | ICD-10-CM | POA: Diagnosis not present

## 2018-07-23 DIAGNOSIS — R945 Abnormal results of liver function studies: Secondary | ICD-10-CM

## 2018-07-25 DIAGNOSIS — L299 Pruritus, unspecified: Secondary | ICD-10-CM | POA: Diagnosis not present

## 2018-07-26 DIAGNOSIS — L578 Other skin changes due to chronic exposure to nonionizing radiation: Secondary | ICD-10-CM | POA: Diagnosis not present

## 2018-07-26 DIAGNOSIS — L299 Pruritus, unspecified: Secondary | ICD-10-CM | POA: Diagnosis not present

## 2018-07-26 DIAGNOSIS — Z85828 Personal history of other malignant neoplasm of skin: Secondary | ICD-10-CM | POA: Diagnosis not present

## 2018-07-26 DIAGNOSIS — L821 Other seborrheic keratosis: Secondary | ICD-10-CM | POA: Diagnosis not present

## 2018-07-26 DIAGNOSIS — R21 Rash and other nonspecific skin eruption: Secondary | ICD-10-CM | POA: Diagnosis not present

## 2018-07-27 ENCOUNTER — Ambulatory Visit: Admission: RE | Admit: 2018-07-27 | Payer: Medicare Other | Source: Ambulatory Visit

## 2018-07-27 ENCOUNTER — Ambulatory Visit
Admission: RE | Admit: 2018-07-27 | Discharge: 2018-07-27 | Disposition: A | Discharge status: Home / Self Care | Payer: Medicare Other | Source: Ambulatory Visit | Attending: Gastroenterology | Admitting: Gastroenterology

## 2018-07-27 ENCOUNTER — Other Ambulatory Visit: Payer: Self-pay | Admitting: Physician Assistant

## 2018-07-27 ENCOUNTER — Other Ambulatory Visit: Payer: Self-pay

## 2018-07-27 DIAGNOSIS — R7989 Other specified abnormal findings of blood chemistry: Secondary | ICD-10-CM

## 2018-07-27 DIAGNOSIS — K8309 Other cholangitis: Secondary | ICD-10-CM | POA: Diagnosis not present

## 2018-07-27 DIAGNOSIS — E785 Hyperlipidemia, unspecified: Secondary | ICD-10-CM | POA: Diagnosis not present

## 2018-07-27 DIAGNOSIS — Z882 Allergy status to sulfonamides status: Secondary | ICD-10-CM | POA: Insufficient documentation

## 2018-07-27 DIAGNOSIS — Z79899 Other long term (current) drug therapy: Secondary | ICD-10-CM | POA: Diagnosis not present

## 2018-07-27 DIAGNOSIS — K7689 Other specified diseases of liver: Secondary | ICD-10-CM | POA: Diagnosis not present

## 2018-07-27 DIAGNOSIS — R945 Abnormal results of liver function studies: Secondary | ICD-10-CM

## 2018-07-27 DIAGNOSIS — Z881 Allergy status to other antibiotic agents status: Secondary | ICD-10-CM | POA: Insufficient documentation

## 2018-07-27 DIAGNOSIS — Z87891 Personal history of nicotine dependence: Secondary | ICD-10-CM | POA: Diagnosis not present

## 2018-07-27 DIAGNOSIS — R772 Abnormality of alphafetoprotein: Secondary | ICD-10-CM | POA: Diagnosis not present

## 2018-07-27 DIAGNOSIS — Z7951 Long term (current) use of inhaled steroids: Secondary | ICD-10-CM | POA: Diagnosis not present

## 2018-07-27 DIAGNOSIS — Z888 Allergy status to other drugs, medicaments and biological substances status: Secondary | ICD-10-CM | POA: Insufficient documentation

## 2018-07-27 DIAGNOSIS — R748 Abnormal levels of other serum enzymes: Secondary | ICD-10-CM | POA: Insufficient documentation

## 2018-07-27 HISTORY — DX: Pneumonia, unspecified organism: J18.9

## 2018-07-27 HISTORY — DX: Gastro-esophageal reflux disease without esophagitis: K21.9

## 2018-07-27 HISTORY — DX: Unspecified asthma, uncomplicated: J45.909

## 2018-07-27 HISTORY — DX: Dyspnea, unspecified: R06.00

## 2018-07-27 LAB — CBC
HEMATOCRIT: 42.9 % (ref 36.0–46.0)
Hemoglobin: 14 g/dL (ref 12.0–15.0)
MCH: 28.1 pg (ref 26.0–34.0)
MCHC: 32.6 g/dL (ref 30.0–36.0)
MCV: 86 fL (ref 80.0–100.0)
Platelets: 228 10*3/uL (ref 150–400)
RBC: 4.99 MIL/uL (ref 3.87–5.11)
RDW: 14.3 % (ref 11.5–15.5)
WBC: 6.1 10*3/uL (ref 4.0–10.5)
nRBC: 0 % (ref 0.0–0.2)

## 2018-07-27 LAB — PROTIME-INR
INR: 0.9 (ref 0.8–1.2)
Prothrombin Time: 12.3 seconds (ref 11.4–15.2)

## 2018-07-27 MED ORDER — SODIUM CHLORIDE 0.9 % IV SOLN
INTRAVENOUS | Status: DC
  Administered 2018-07-27: 09:00:00 via INTRAVENOUS

## 2018-07-27 MED ORDER — MIDAZOLAM HCL 2 MG/2ML IJ SOLN
INTRAMUSCULAR | Status: AC
  Filled 2018-07-27: qty 6

## 2018-07-27 MED ORDER — FENTANYL CITRATE (PF) 100 MCG/2ML IJ SOLN
INTRAMUSCULAR | Status: AC
  Filled 2018-07-27: qty 4

## 2018-07-27 MED ORDER — FENTANYL CITRATE (PF) 100 MCG/2ML IJ SOLN
INTRAMUSCULAR | Status: AC | PRN
  Administered 2018-07-27: 50 ug via INTRAVENOUS

## 2018-07-27 MED ORDER — MIDAZOLAM HCL 5 MG/5ML IJ SOLN
INTRAMUSCULAR | Status: AC | PRN
  Administered 2018-07-27 (×2): 1 mg via INTRAVENOUS

## 2018-07-27 NOTE — H&P (Signed)
Chief Complaint: Patient was seen in consultation today for random liver biopsy  Referring Physician(s): Henrietta  Supervising Physician: Daryll Brod  Patient Status: [facility] - Out-pt  History of Present Illness: Cheryl Hays is a 73 y.o. female with a past medical history significant for HLD, eczema and chronic ALP elevation with unknown etiology followed by Dr. Allen Norris who presents today for a random liver biopsy. Per chart patient has had a chronically elevated ALP for the last year without other LFT abnormalities. She underwent RUQ ultrasound on 04/19/18 which showed normal liver parenchyma without focal abnormality. She was previously on Lipitor which was stopped, however ALP did not improve. She has been referred to IR for a random liver biopsy for further evaluation.  Patient states that she feels fine, has no complaints today. She reports that she's had a biopsy of her skin near her ankle before so she understands generally how the biopsy will work.   Past Medical History:  Diagnosis Date  . Hyperlipidemia     Past Surgical History:  Procedure Laterality Date  . BREAST SURGERY Left 2000   biopsy  . CESAREAN SECTION  05/08/1978, 07/03/1979  . DILATION AND CURETTAGE OF UTERUS  1993  . FACIAL COSMETIC SURGERY  2006  . SKIN SURGERY Right    hand 02/2016 x's 2 Dr. Nehemiah Massed    Allergies: Celecoxib; Levofloxacin; Sulfa antibiotics; and Pseudoephedrine  Medications: Prior to Admission medications   Medication Sig Start Date End Date Taking? Authorizing Provider  atorvastatin (LIPITOR) 20 MG tablet TAKE 1 TABLET(20 MG) BY MOUTH DAILY 10/05/17  Yes Bacigalupo, Dionne Bucy, MD  Azelastine HCl 0.15 % SOLN Place 1 spray into the nose 2 (two) times daily. 06/14/16  Yes Carmon Ginsberg, PA  Calcium Carb-Cholecalciferol (CALCIUM 600+D3) 600-800 MG-UNIT TABS Take 1 tablet by mouth daily.   Yes [provider]  hydrOXYzine (ATARAX/VISTARIL) 10 MG tablet Take 1 tablet (10 mg  total) by mouth every 8 (eight) hours as needed for itching. 03/28/18  Yes Bacigalupo, Dionne Bucy, MD  SYMBICORT 80-4.5 MCG/ACT inhaler INHALE 2 PUFFS INTO THE LUNGS TWICE DAILY 10/12/16  Yes Carmon Ginsberg, PA  albuterol (PROVENTIL HFA;VENTOLIN HFA) 108 (90 Base) MCG/ACT inhaler Inhale 2 puffs into the lungs every 6 (six) hours as needed for wheezing or shortness of breath. 06/14/16   Carmon Ginsberg, PA  diphenhydrAMINE (BENADRYL) 25 MG tablet Take 25 mg by mouth at bedtime as needed.    [provider]  famotidine (PEPCID) 20 MG tablet Take 1 tablet (20 mg total) by mouth 2 (two) times daily. Patient not taking: Reported on 07/27/2018 03/18/18 03/18/19  Sherrie George B, FNP  meloxicam (MOBIC) 7.5 MG tablet Take 1 tablet (7.5 mg total) by mouth daily. 03/23/15   Margarita Rana, MD  montelukast (SINGULAIR) 10 MG tablet Take 1 tablet (10 mg total) by mouth daily. Patient not taking: Reported on 07/27/2018 08/03/17   Virginia Crews, MD  mycophenolate (CELLCEPT) 250 MG capsule TK 1 C PO BID FOR ITCHING 05/24/18   [provider]  naltrexone (DEPADE) 50 MG tablet  05/30/18   [provider]  predniSONE (DELTASONE) 20 MG tablet  05/24/18   [provider]  triamcinolone cream (KENALOG) 0.1 % Apply 1 application 2 (two) times daily topically.  01/25/17   [provider]     Family History  Problem Relation Age of Onset  . Heart murmur Mother   . Vision loss Father   . Bladder Cancer Brother  Social History   Socioeconomic History  . Marital status: Widowed    Spouse name: Not on file  . Number of children: 2  . Years of education: 66  . Highest education level: Some college, no degree  Occupational History  . Occupation: retired  Scientific laboratory technician  . Financial resource strain: Not hard at all  . Food insecurity:    Worry: Never true    Inability: Never true  . Transportation needs:    Medical: No    Non-medical: No  Tobacco Use  . Smoking status:  Former Smoker    Last attempt to quit: 05/16/1975    Years since quitting: 43.2  . Smokeless tobacco: Never Used  Substance and Sexual Activity  . Alcohol use: Yes    Comment: a couple drink monthly  . Drug use: No  . Sexual activity: Not on file  Lifestyle  . Physical activity:    Days per week: 0 days    Minutes per session: 0 min  . Stress: Not at all  Relationships  . Social connections:    Talks on phone: Patient refused    Gets together: Patient refused    Attends religious service: Patient refused    Active member of club or organization: Patient refused    Attends meetings of clubs or organizations: Patient refused    Relationship status: Patient refused  Other Topics Concern  . Not on file  Social History Narrative  . Not on file     Review of Systems: A 12 point ROS discussed and pertinent positives are indicated in the HPI above.  All other systems are negative.  Review of Systems  Constitutional: Negative for appetite change, chills and fever.  Respiratory: Negative for cough and shortness of breath.   Cardiovascular: Negative for chest pain.  Gastrointestinal: Negative for abdominal pain, blood in stool, diarrhea, nausea and vomiting.  Genitourinary: Negative for hematuria.  Musculoskeletal: Negative for back pain.  Skin: Negative for color change.  Neurological: Negative for dizziness, syncope and headaches.    Vital Signs: BP (!) 169/113   Pulse 83   Temp 97.9 F (36.6 C) (Oral)   Ht 5' (1.524 m)   Wt 138 lb (62.6 kg)   SpO2 100%   BMI 26.95 kg/m   Physical Exam Vitals signs reviewed.  Constitutional:      General: She is not in acute distress. HENT:     Head: Normocephalic.  Cardiovascular:     Rate and Rhythm: Normal rate and regular rhythm.  Pulmonary:     Effort: Pulmonary effort is normal.     Breath sounds: Normal breath sounds.  Abdominal:     General: There is no distension.     Palpations: Abdomen is soft.     Tenderness:  There is no abdominal tenderness.  Skin:    General: Skin is warm and dry.  Neurological:     Mental Status: She is alert and oriented to person, place, and time.  Psychiatric:        Mood and Affect: Mood normal.        Behavior: Behavior normal.        Thought Content: Thought content normal.        Judgment: Judgment normal.      MD Evaluation Airway: WNL Heart: WNL Abdomen: WNL Chest/ Lungs: WNL ASA  Classification: 2 Mallampati/Airway Score: One   Imaging: No results found.  Labs:  CBC: Recent Labs    03/28/18 1437  WBC 9.8  HGB 14.5  HCT 44.2  PLT 340    COAGS: No results for input(s): INR, APTT in the last 8760 hours.  BMP: Recent Labs    09/12/17 1152 03/28/18 1437  NA 142 141  K 4.2 4.3  CL 102 100  CO2 26 25  GLUCOSE 100* 91  BUN 11 15  CALCIUM 9.2 9.5  CREATININE 0.63 0.74  GFRNONAA 89 81  GFRAA 103 93    LIVER FUNCTION TESTS: Recent Labs    09/12/17 1152 03/28/18 1437 05/15/18 1122 07/05/18 1405  BILITOT 0.7 0.8 0.7 0.7  AST 28 29 24 27   ALT 31 47* 30 29  ALKPHOS 124* 178* 169* 147*  PROT 6.7 6.8 6.6 7.5  ALBUMIN 4.5 4.2 4.3 4.3    TUMOR MARKERS: No results for input(s): AFPTM, CEA, CA199, CHROMGRNA in the last 8760 hours.  Assessment and Plan:  73 y/o F with elevated ALP x1 year of unknown etiology followed by Dr. Allen Norris who presents today for a random liver biopsy for further evaluation.  Patient has been NPO since 8 pm last night, she did not take any medications this morning, she does not take blood thinning medications. Afebrile, pre-procedure labs pending at time of this note writing.  Risks and benefits of random liver biopsy was discussed with the patient and/or patient's family including, but not limited to bleeding, infection, damage to adjacent structures or low yield requiring additional tests.  All of the questions were answered and there is agreement to proceed.  Consent signed and in chart.  Thank you for  this interesting consult.  I greatly enjoyed [place] and look forward to participating in their care.  A copy of this report was sent to the requesting provider on this date.  Electronically Signed: Joaquim Nam, PA-C 07/27/2018, 8:50 AM   I spent a total of  30 Minutes  in face to face in clinical consultation, greater than 50% of which was counseling/coordinating care for random liver biopsy.

## 2018-07-27 NOTE — Procedures (Signed)
inc'd LFTS  S/P US LIVER RANDOM CORE BX  No comp Stable EBL min Path pending Full report in pacs

## 2018-07-27 NOTE — Discharge Instructions (Signed)
Liver Biopsy, Care After  These instructions give you information about how to care for yourself after your procedure. Your health care provider may also give you more specific instructions. If you have problems or questions, contact your health care provider.  What can I expect after the procedure?  After your procedure, it is common to have:  · Pain and soreness in the area where the biopsy was done.  · Bruising around the area where the biopsy was done.  · Sleepiness and fatigue for 1-2 days.  Follow these instructions at home:  Medicines  · Take over-the-counter and prescription medicines only as told by your health care provider.  · If you were prescribed an antibiotic medicine, take it as told by your health care provider. Do not stop taking the antibiotic even if you start to feel better.  · Do not take medicines such as aspirin and ibuprofen unless your health care provider tells you to take them. These medicines thin your blood and can increase the risk of bleeding.  · If you are taking prescription pain medicine, take actions to prevent or treat constipation. Your health care provider may recommend that you:  ? Drink enough fluid to keep your urine pale yellow.  ? Eat foods that are high in fiber, such as fresh fruits and vegetables, whole grains, and beans.  ? Limit foods that are high in fat and processed sugars, such as fried or sweet foods.  ? Take an over-the-counter or prescription medicine for constipation.  Incision care  · Follow instructions from your health care provider about how to take care of your incision. Make sure you:  ? Wash your hands with soap and water before you change your bandage (dressing). If soap and water are not available, use hand sanitizer.  ? Change your dressing as told by your health care provider.  ? Leave stitches (sutures), skin glue, or adhesive strips in place. These skin closures may need to stay in place for 2 weeks or longer. If adhesive strip edges start to  loosen and curl up, you may trim the loose edges. Do not remove adhesive strips completely unless your health care provider tells you to do that.  · Check your incision area every day for signs of infection. Check for:  ? Redness, swelling, or pain.  ? Fluid or blood.  ? Warmth.  ? Pus or a bad smell.  · Do not take baths, swim, or use a hot tub until your health care provider says it is okay to do so.  Activity    · Rest at home for 1-2 days, or as directed by your health care provider.  ? Avoid sitting for a long time without moving. Get up to take short walks every 1-2 hours. This is important to improve blood flow and breathing. Ask for help if you feel weak or unsteady.  · Return to your normal activities as told by your health care provider. Ask your health care provider what activities are safe for you.  · Do not drive or use heavy machinery while taking prescription pain medicine.  · Do not lift anything that is heavier than 10 lb (4.5 kg), or the limit that your health care provider tells you, until he or she says that it is safe.  · Do not play contact sports for 2 weeks after the procedure.  General instructions    · Do not drink alcohol in the first week after the procedure.  · Have   someone stay with you for at least 24 hours after the procedure.  · It is your responsibility to obtain your test results. Ask your health care provider, or the department that is doing the test:  ? When will my results be ready?  ? How will I get my results?  ? What are my treatment options?  ? What other tests do I need?  ? What are my next steps?  · Keep all follow-up visits as told by your health care provider. This is important.  Contact a health care provider if:  · You have increased bleeding from an incision, resulting in more than a small spot of blood.  · You have redness, swelling, or increasing pain in any incisions.  · You notice a discharge or a bad smell coming from any of your incisions.  · You have a fever or  chills.  Get help right away if:  · You develop swelling, bloating, or pain in your abdomen.  · You become dizzy or faint.  · You develop a rash.  · You have nausea or you vomit.  · You faint, or you have shortness of breath or difficulty breathing.  · You develop chest pain.  · You have problems with your speech or vision.  · You have trouble with your balance or moving your arms or legs.  Summary  · After the liver biopsy, it is common to have pain, soreness, and bruising in the area, as well as sleepiness and fatigue.  · Take over-the-counter and prescription medicines only as told by your health care provider.  · Follow instructions from your health care provider about how to care for your incision. Check the incision area daily for signs of infection.  This information is not intended to replace advice given to you by your health care provider. Make sure you discuss any questions you have with your health care provider.  Document Released: 11/19/2004 Document Revised: 05/12/2017 Document Reviewed: 05/12/2017  Elsevier Interactive Patient Education © 2019 [employer]

## 2018-07-31 LAB — SURGICAL PATHOLOGY

## 2018-08-03 ENCOUNTER — Telehealth: Payer: Self-pay

## 2018-08-03 NOTE — Telephone Encounter (Signed)
-----   Message from [name] Lame, MD sent at 08/01/2018  7:21 AM EDT ----- Please have the patient come in for a follow up.

## 2018-08-03 NOTE — Telephone Encounter (Signed)
LVM for pt to call office to schedule her telemed call with Dr. Allen Norris for the morning  Of 08/07/18.  Thanks  [employer]

## 2018-08-03 NOTE — Telephone Encounter (Signed)
Patients telemed call has been scheduled for 08/07/18 with Dr. Allen Norris for 9:30 am.  Advised patient that she should have her insurance card ready for telephone registration call to be received at 09:15am, then shortly following registration Dr. Allen Norris will call to begin her telemed appt. Advised her that if her call is miss, it will not be rescheduled until the following week.  Thanks [employer]

## 2018-08-03 NOTE — Telephone Encounter (Signed)
Pt notified Dr. Allen Norris requested a follow up appt. Pt will be contacted next week to schedule a telephone visit to discuss.

## 2018-08-06 NOTE — Progress Notes (Signed)
Primary Care Physician: Virginia Crews, MD  Primary Gastroenterologist:  Dr. [name] Lame  CC: Follow-up for a liver biopsy  Virtual Visit via Telephone Note  I connected with [employer] on 08/07/18 at  9:30 AM EDT by telephone and verified that I am speaking with the correct person using two identifiers.   I discussed the limitations, risks, security and privacy concerns of performing an evaluation and management service by telephone and the availability of in person appointments. I also discussed with the patient that there may be a patient responsible charge related to this service. The patient expressed understanding and agreed to proceed.  I discussed the assessment and treatment plan with the patient. The patient was provided an opportunity to ask questions and all were answered. The patient agreed with the plan and demonstrated an understanding of the instructions.   The patient was advised to call back or seek an in-person evaluation if the symptoms worsen or if the condition fails to improve as anticipated.  Location of the patient: Home Location of provider: Office Participating persons: Myself and Ginger Feldpausch  I provided 22 minutes of non-face-to-face time during this encounter.  [name] Lame, MD  HPI: Cheryl Hays is a 73 y.o. female being contacted for a telemedicine follow-up.  This is being done to review the patient's recent liver biopsy. The biopsy was reported as:  - FOCAL BILE DUCT INJURY AND MILD PORTAL INFLAMMATION, SEE COMMENT.  (comment).Marland KitchenMarland KitchenThe histologic features are not specific, and the changes  are very patchy. Primary biliary cholangitis/autoimmune cholangitis  could be a consideration....    Current Outpatient Medications  Medication Sig Dispense Refill  . albuterol (PROVENTIL HFA;VENTOLIN HFA) 108 (90 Base) MCG/ACT inhaler Inhale 2 puffs into the lungs every 6 (six) hours as needed for wheezing or shortness of breath. 1 Inhaler  3  . atorvastatin (LIPITOR) 20 MG tablet TAKE 1 TABLET(20 MG) BY MOUTH DAILY 30 tablet 11  . Azelastine HCl 0.15 % SOLN Place 1 spray into the nose 2 (two) times daily. 30 mL 3  . Calcium Carb-Cholecalciferol (CALCIUM 600+D3) 600-800 MG-UNIT TABS Take 1 tablet by mouth daily.    . diphenhydrAMINE (BENADRYL) 25 MG tablet Take 25 mg by mouth at bedtime as needed.    . famotidine (PEPCID) 20 MG tablet Take 1 tablet (20 mg total) by mouth 2 (two) times daily. (Patient not taking: Reported on 07/27/2018) 60 tablet 1  . hydrOXYzine (ATARAX/VISTARIL) 10 MG tablet Take 1 tablet (10 mg total) by mouth every 8 (eight) hours as needed for itching. 90 tablet 2  . meloxicam (MOBIC) 7.5 MG tablet Take 1 tablet (7.5 mg total) by mouth daily. 90 tablet 1  . montelukast (SINGULAIR) 10 MG tablet Take 1 tablet (10 mg total) by mouth daily. (Patient not taking: Reported on 07/27/2018) 90 tablet 3  . mycophenolate (CELLCEPT) 250 MG capsule TK 1 C PO BID FOR ITCHING    . naltrexone (DEPADE) 50 MG tablet     . predniSONE (DELTASONE) 20 MG tablet     . SYMBICORT 80-4.5 MCG/ACT inhaler INHALE 2 PUFFS INTO THE LUNGS TWICE DAILY 10.2 g 5  . triamcinolone cream (KENALOG) 0.1 % Apply 1 application 2 (two) times daily topically.   0   No current facility-administered medications for this visit.     Allergies as of 08/07/2018 - Review Complete 07/27/2018  Allergen Reaction Noted  . Celecoxib  10/31/2014  . Levofloxacin Other (See Comments) 03/23/2016  . Sulfa antibiotics  10/31/2014  .  Pseudoephedrine Rash 10/31/2014    Physical Examination: N/A   Labs:    Imaging Studies: US Biopsy (liver)  Result Date: 07/27/2018 INDICATION: Abnormal LFTs, elevated alkaline phosphatase EXAM: Ultrasound right liver core biopsy MEDICATIONS: 1% lidocaine local ANESTHESIA/SEDATION: Moderate (conscious) sedation was employed during this procedure. A total of Versed 2.0 mg and Fentanyl 50 mcg was administered intravenously. Moderate  Sedation Time: 11 minutes. The patient's level of consciousness and vital signs were monitored continuously by radiology nursing throughout the procedure under my direct supervision. FLUOROSCOPY TIME:  Fluoroscopy Time: 0 COMPLICATIONS: None immediate. PROCEDURE: Informed written consent was obtained from the patient after a thorough discussion of the procedural risks, benefits and alternatives. All questions were addressed. Maximal Sterile Barrier Technique was utilized including caps, mask, sterile gowns, sterile gloves, sterile drape, hand hygiene and skin antiseptic. A timeout was performed prior to the initiation of the procedure. Preliminary ultrasound performed. The right hepatic lobe was demonstrated in the mid axillary line through a lower intercostal space. Overlying skin marked. Under sterile conditions and local anesthesia, a 17 gauge access needle was advanced into the right hepatic lobe under direct ultrasound. Images obtained for documentation. 3 18 gauge core biopsies obtained under direct ultrasound. Samples were intact and non fragmented. These were placed in formalin. Needle tract occluded with Gel-Foam. No immediate complication. Patient tolerated the procedure well. IMPRESSION: Successful ultrasound right liver random core biopsy Electronically Signed   By: Jerilynn Mages.  Shick M.D.   On: 07/27/2018 11:08    Assessment and Plan:   Cheryl Hays is a 73 y.o. y/o female Who has a liver biopsy consistent with, - FOCAL BILE DUCT INJURY AND MILD PORTAL INFLAMMATION. It was reported that this could be from [facility] or possibly autoimmune cholangitis.  The patient will have her labs sent off for AMA and SMA. The patient will be started on ursodeoxycholic acid and for her weight should be 300 mg taken 3 times a day.  The patient also reports that she has been having some itching which goes along with possible PBC.  The patient has been told to have repeat labs in 1 month.   [name] Lame, MD. Marval Regal   Note:  This dictation was prepared with Dragon dictation along with smaller phrase technology. Any transcriptional errors that result from this process are unintentional.

## 2018-08-07 ENCOUNTER — Other Ambulatory Visit: Payer: Self-pay

## 2018-08-07 ENCOUNTER — Telehealth (INDEPENDENT_AMBULATORY_CARE_PROVIDER_SITE_OTHER): Payer: Medicare Other | Admitting: Gastroenterology

## 2018-08-07 VITALS — Wt 138.0 lb

## 2018-08-07 DIAGNOSIS — R945 Abnormal results of liver function studies: Secondary | ICD-10-CM | POA: Diagnosis not present

## 2018-08-07 DIAGNOSIS — R7989 Other specified abnormal findings of blood chemistry: Secondary | ICD-10-CM

## 2018-08-07 MED ORDER — URSODIOL 300 MG PO CAPS: 300.0000 mg | ORAL_CAPSULE | Freq: Three times a day (TID) | ORAL | 3 refills | Status: DC

## 2018-09-06 DIAGNOSIS — R945 Abnormal results of liver function studies: Secondary | ICD-10-CM | POA: Diagnosis not present

## 2018-09-06 DIAGNOSIS — L2089 Other atopic dermatitis: Secondary | ICD-10-CM | POA: Diagnosis not present

## 2018-09-06 DIAGNOSIS — L0101 Non-bullous impetigo: Secondary | ICD-10-CM | POA: Diagnosis not present

## 2018-09-07 DIAGNOSIS — L2089 Other atopic dermatitis: Secondary | ICD-10-CM | POA: Diagnosis not present

## 2018-09-07 LAB — HEPATIC FUNCTION PANEL
ALT: 27 IU/L (ref 0–32)
AST: 29 IU/L (ref 0–40)
Albumin: 4.2 g/dL (ref 3.7–4.7)
Alkaline Phosphatase: 218 IU/L — ABNORMAL HIGH (ref 39–117)
Bilirubin Total: 0.8 mg/dL (ref 0.0–1.2)
Bilirubin, Direct: 0.29 mg/dL (ref 0.00–0.40)
Total Protein: 6.7 g/dL (ref 6.0–8.5)

## 2018-09-07 LAB — ANA: Anti Nuclear Antibody (ANA): NEGATIVE

## 2018-09-10 DIAGNOSIS — L2089 Other atopic dermatitis: Secondary | ICD-10-CM | POA: Diagnosis not present

## 2018-09-11 ENCOUNTER — Telehealth: Payer: Self-pay

## 2018-09-11 NOTE — Telephone Encounter (Signed)
Pt notified of lab results via mychart. 

## 2018-09-11 NOTE — Telephone Encounter (Signed)
-----  Message from [name] Lame, MD sent at 09/10/2018 11:26 AM EDT ----- Let the patient know the alk phos is still elevated and she should have it checked in 1 month.

## 2018-09-13 DIAGNOSIS — L2089 Other atopic dermatitis: Secondary | ICD-10-CM | POA: Diagnosis not present

## 2018-09-17 DIAGNOSIS — L2089 Other atopic dermatitis: Secondary | ICD-10-CM | POA: Diagnosis not present

## 2018-09-20 DIAGNOSIS — L2089 Other atopic dermatitis: Secondary | ICD-10-CM | POA: Diagnosis not present

## 2018-09-24 DIAGNOSIS — S52591A Other fractures of lower end of right radius, initial encounter for closed fracture: Secondary | ICD-10-CM | POA: Diagnosis not present

## 2018-09-24 DIAGNOSIS — S6991XA Unspecified injury of right wrist, hand and finger(s), initial encounter: Secondary | ICD-10-CM | POA: Diagnosis not present

## 2018-09-24 DIAGNOSIS — S52571A Other intraarticular fracture of lower end of right radius, initial encounter for closed fracture: Secondary | ICD-10-CM | POA: Diagnosis not present

## 2018-09-24 DIAGNOSIS — S52614A Nondisplaced fracture of right ulna styloid process, initial encounter for closed fracture: Secondary | ICD-10-CM | POA: Diagnosis not present

## 2018-09-24 DIAGNOSIS — L2089 Other atopic dermatitis: Secondary | ICD-10-CM | POA: Diagnosis not present

## 2018-09-25 DIAGNOSIS — S52501A Unspecified fracture of the lower end of right radius, initial encounter for closed fracture: Secondary | ICD-10-CM | POA: Diagnosis not present

## 2018-09-25 DIAGNOSIS — S52601A Unspecified fracture of lower end of right ulna, initial encounter for closed fracture: Secondary | ICD-10-CM | POA: Diagnosis not present

## 2018-09-25 DIAGNOSIS — Z6824 Body mass index (BMI) 24.0-24.9, adult: Secondary | ICD-10-CM | POA: Diagnosis not present

## 2018-09-27 DIAGNOSIS — L2089 Other atopic dermatitis: Secondary | ICD-10-CM | POA: Diagnosis not present

## 2018-10-03 DIAGNOSIS — L2089 Other atopic dermatitis: Secondary | ICD-10-CM | POA: Diagnosis not present

## 2018-10-05 DIAGNOSIS — L2089 Other atopic dermatitis: Secondary | ICD-10-CM | POA: Diagnosis not present

## 2018-10-09 DIAGNOSIS — S52501A Unspecified fracture of the lower end of right radius, initial encounter for closed fracture: Secondary | ICD-10-CM | POA: Diagnosis not present

## 2018-10-09 DIAGNOSIS — S52611A Displaced fracture of right ulna styloid process, initial encounter for closed fracture: Secondary | ICD-10-CM | POA: Diagnosis not present

## 2018-10-10 DIAGNOSIS — L2089 Other atopic dermatitis: Secondary | ICD-10-CM | POA: Diagnosis not present

## 2018-10-11 ENCOUNTER — Other Ambulatory Visit: Payer: Self-pay

## 2018-10-11 DIAGNOSIS — R945 Abnormal results of liver function studies: Secondary | ICD-10-CM

## 2018-10-11 DIAGNOSIS — R7989 Other specified abnormal findings of blood chemistry: Secondary | ICD-10-CM

## 2018-10-12 DIAGNOSIS — R945 Abnormal results of liver function studies: Secondary | ICD-10-CM | POA: Diagnosis not present

## 2018-10-12 DIAGNOSIS — L2089 Other atopic dermatitis: Secondary | ICD-10-CM | POA: Diagnosis not present

## 2018-10-13 LAB — HEPATIC FUNCTION PANEL
ALT: 22 IU/L (ref 0–32)
AST: 26 IU/L (ref 0–40)
Albumin: 4.5 g/dL (ref 3.7–4.7)
Alkaline Phosphatase: 194 IU/L — ABNORMAL HIGH (ref 39–117)
Bilirubin Total: 0.6 mg/dL (ref 0.0–1.2)
Bilirubin, Direct: 0.18 mg/dL (ref 0.00–0.40)
Total Protein: 6.6 g/dL (ref 6.0–8.5)

## 2018-10-15 DIAGNOSIS — L2089 Other atopic dermatitis: Secondary | ICD-10-CM | POA: Diagnosis not present

## 2018-10-17 DIAGNOSIS — L2089 Other atopic dermatitis: Secondary | ICD-10-CM | POA: Diagnosis not present

## 2018-10-18 ENCOUNTER — Other Ambulatory Visit: Payer: Self-pay | Admitting: Family Medicine

## 2018-10-18 NOTE — Telephone Encounter (Signed)
Please review

## 2018-10-22 DIAGNOSIS — L2089 Other atopic dermatitis: Secondary | ICD-10-CM | POA: Diagnosis not present

## 2018-10-24 ENCOUNTER — Telehealth: Payer: Self-pay

## 2018-10-24 DIAGNOSIS — L2089 Other atopic dermatitis: Secondary | ICD-10-CM | POA: Diagnosis not present

## 2018-10-24 NOTE — Telephone Encounter (Signed)
-----   Message from [name] Lame, MD sent at 10/22/2018 11:39 AM EDT ----- Let the patient know that her alkaline phosphatase continues to remain elevated.  The biopsy did not show any significant long-term damage to the liver from this elevation.  There is also only mild inflammation.  This is unlikely to cause her any long-term problems.  If the patient is concerned about other possible causes since our work-up has been negative then we should have her consulted at a tertiary care facility like [facility] or [place].

## 2018-10-24 NOTE — Telephone Encounter (Signed)
Results given to pt via MyChart.

## 2018-10-26 DIAGNOSIS — L2089 Other atopic dermatitis: Secondary | ICD-10-CM | POA: Diagnosis not present

## 2018-10-29 DIAGNOSIS — S52601A Unspecified fracture of lower end of right ulna, initial encounter for closed fracture: Secondary | ICD-10-CM | POA: Diagnosis not present

## 2018-11-02 DIAGNOSIS — L2089 Other atopic dermatitis: Secondary | ICD-10-CM | POA: Diagnosis not present

## 2018-11-05 DIAGNOSIS — L2089 Other atopic dermatitis: Secondary | ICD-10-CM | POA: Diagnosis not present

## 2018-11-07 DIAGNOSIS — L2089 Other atopic dermatitis: Secondary | ICD-10-CM | POA: Diagnosis not present

## 2018-11-08 DIAGNOSIS — L2089 Other atopic dermatitis: Secondary | ICD-10-CM | POA: Diagnosis not present

## 2018-11-08 DIAGNOSIS — L820 Inflamed seborrheic keratosis: Secondary | ICD-10-CM | POA: Diagnosis not present

## 2018-11-14 DIAGNOSIS — L2089 Other atopic dermatitis: Secondary | ICD-10-CM | POA: Diagnosis not present

## 2018-11-19 DIAGNOSIS — L2089 Other atopic dermatitis: Secondary | ICD-10-CM | POA: Diagnosis not present

## 2018-11-21 DIAGNOSIS — L2089 Other atopic dermatitis: Secondary | ICD-10-CM | POA: Diagnosis not present

## 2018-11-26 DIAGNOSIS — L2089 Other atopic dermatitis: Secondary | ICD-10-CM | POA: Diagnosis not present

## 2018-11-28 DIAGNOSIS — L2089 Other atopic dermatitis: Secondary | ICD-10-CM | POA: Diagnosis not present

## 2018-12-03 DIAGNOSIS — L2089 Other atopic dermatitis: Secondary | ICD-10-CM | POA: Diagnosis not present

## 2018-12-05 DIAGNOSIS — L2089 Other atopic dermatitis: Secondary | ICD-10-CM | POA: Diagnosis not present

## 2018-12-10 DIAGNOSIS — L2089 Other atopic dermatitis: Secondary | ICD-10-CM | POA: Diagnosis not present

## 2018-12-12 DIAGNOSIS — L2089 Other atopic dermatitis: Secondary | ICD-10-CM | POA: Diagnosis not present

## 2018-12-17 DIAGNOSIS — L2089 Other atopic dermatitis: Secondary | ICD-10-CM | POA: Diagnosis not present

## 2018-12-19 DIAGNOSIS — L2089 Other atopic dermatitis: Secondary | ICD-10-CM | POA: Diagnosis not present

## 2018-12-24 DIAGNOSIS — L2089 Other atopic dermatitis: Secondary | ICD-10-CM | POA: Diagnosis not present

## 2018-12-26 DIAGNOSIS — L2089 Other atopic dermatitis: Secondary | ICD-10-CM | POA: Diagnosis not present

## 2018-12-31 DIAGNOSIS — L2089 Other atopic dermatitis: Secondary | ICD-10-CM | POA: Diagnosis not present

## 2019-01-02 DIAGNOSIS — L2089 Other atopic dermatitis: Secondary | ICD-10-CM | POA: Diagnosis not present

## 2019-01-09 DIAGNOSIS — L2089 Other atopic dermatitis: Secondary | ICD-10-CM | POA: Diagnosis not present

## 2019-01-10 DIAGNOSIS — L2089 Other atopic dermatitis: Secondary | ICD-10-CM | POA: Diagnosis not present

## 2019-01-10 DIAGNOSIS — L570 Actinic keratosis: Secondary | ICD-10-CM | POA: Diagnosis not present

## 2019-01-16 DIAGNOSIS — L2089 Other atopic dermatitis: Secondary | ICD-10-CM | POA: Diagnosis not present

## 2019-01-21 DIAGNOSIS — Z23 Encounter for immunization: Secondary | ICD-10-CM | POA: Diagnosis not present

## 2019-01-23 DIAGNOSIS — L2089 Other atopic dermatitis: Secondary | ICD-10-CM | POA: Diagnosis not present

## 2019-01-30 DIAGNOSIS — L2089 Other atopic dermatitis: Secondary | ICD-10-CM | POA: Diagnosis not present

## 2019-02-13 DIAGNOSIS — L2089 Other atopic dermatitis: Secondary | ICD-10-CM | POA: Diagnosis not present

## 2019-02-15 DIAGNOSIS — L2089 Other atopic dermatitis: Secondary | ICD-10-CM | POA: Diagnosis not present

## 2019-02-16 DIAGNOSIS — Z20828 Contact with and (suspected) exposure to other viral communicable diseases: Secondary | ICD-10-CM | POA: Diagnosis not present

## 2019-02-18 DIAGNOSIS — L2089 Other atopic dermatitis: Secondary | ICD-10-CM | POA: Diagnosis not present

## 2019-03-07 DIAGNOSIS — L2089 Other atopic dermatitis: Secondary | ICD-10-CM | POA: Diagnosis not present

## 2019-03-11 ENCOUNTER — Other Ambulatory Visit: Payer: Self-pay | Admitting: Family Medicine

## 2019-03-11 DIAGNOSIS — Z1231 Encounter for screening mammogram for malignant neoplasm of breast: Secondary | ICD-10-CM

## 2019-03-28 ENCOUNTER — Telehealth: Payer: Self-pay

## 2019-03-28 NOTE — Telephone Encounter (Signed)
Called pt to cancel AWV and inquire about moving the apt to another day or a telephonic visit and pt declined. Advised pt I no longer work on Fridays and that Id be happy to work her in at another time and pt stated that she was not interested in having the AWV prior and thought about cancelling it. FYI!

## 2019-04-01 ENCOUNTER — Ambulatory Visit: Payer: Self-pay

## 2019-04-01 ENCOUNTER — Encounter: Payer: Self-pay | Admitting: Family Medicine

## 2019-04-05 ENCOUNTER — Ambulatory Visit (INDEPENDENT_AMBULATORY_CARE_PROVIDER_SITE_OTHER): Payer: Medicare Other | Admitting: Family Medicine

## 2019-04-05 ENCOUNTER — Ambulatory Visit: Payer: Self-pay

## 2019-04-05 ENCOUNTER — Other Ambulatory Visit: Payer: Self-pay

## 2019-04-05 ENCOUNTER — Encounter: Payer: Self-pay | Admitting: Family Medicine

## 2019-04-05 VITALS — BP 139/81 | HR 66 | Temp 97.1°F | Resp 16 | Ht 60.0 in | Wt 134.8 lb

## 2019-04-05 DIAGNOSIS — R739 Hyperglycemia, unspecified: Secondary | ICD-10-CM

## 2019-04-05 DIAGNOSIS — R7989 Other specified abnormal findings of blood chemistry: Secondary | ICD-10-CM

## 2019-04-05 DIAGNOSIS — E7800 Pure hypercholesterolemia, unspecified: Secondary | ICD-10-CM

## 2019-04-05 DIAGNOSIS — Z0000 Encounter for general adult medical examination without abnormal findings: Secondary | ICD-10-CM | POA: Diagnosis not present

## 2019-04-05 DIAGNOSIS — R945 Abnormal results of liver function studies: Secondary | ICD-10-CM

## 2019-04-05 NOTE — Progress Notes (Signed)
Patient: Cheryl Hays, Female    DOB: [birth year], 73 y.o.   MRN: [id number] Visit Date: 04/05/2019  Today's Provider: Lavon Paganini, MD   Chief Complaint  Patient presents with  . Hyperlipidemia   Subjective:      Lipid/Cholesterol, Follow-up:   Last seen for this1 years ago.  Management changes since that visit include check labs.  Last Lipid Panel:    Component Value Date/Time   CHOL 162 03/28/2018 1437   TRIG 132 03/28/2018 1437   HDL 66 03/28/2018 1437   CHOLHDL 2.5 03/28/2018 1437   CHOLHDL 2.8 03/28/2017 0816   LDLCALC 70 03/28/2018 1437   LDLCALC 131 (H) 03/28/2017 0816    Risk factors for vascular disease include hypercholesterolemia  She reports excellent compliance with treatment. She is not having side effects.  Current symptoms include none and have been stable. Weight trend: stable Prior visit with dietician: no Current diet: in general, a "healthy" diet   Current exercise: walking  Wt Readings from Last 3 Encounters:  04/05/19 134 lb 12.8 oz (61.1 kg)  08/07/18 138 lb (62.6 kg)  07/27/18 138 lb (62.6 kg)    04/20/2018 Mammogram-BI-RADS 1 05/18/2017 BMD-Osteoporosis 08/03/2011 Colonoscopy-Diverticulosis, recheck in 5 yrs, 2019 - negative Cologuard -----------------------------------------------------------   Review of Systems  Constitutional: Negative.   HENT: Negative.   Eyes: Negative.   Respiratory: Negative.   Cardiovascular: Negative.   Gastrointestinal: Negative.   Endocrine: Negative.   Genitourinary: Negative.   Musculoskeletal: Negative.   Skin: Negative.   Allergic/Immunologic: Negative.   Neurological: Negative.   Hematological: Negative.   Psychiatric/Behavioral: Negative.     Social History   Socioeconomic History  . Marital status: Widowed    Spouse name: Not on file  . Number of children: 2  . Years of education: 12  . Highest education level: Some college, no degree  Occupational History  .  Occupation: retired  Scientific laboratory technician  . Financial resource strain: Not hard at all  . Food insecurity    Worry: Never true    Inability: Never true  . Transportation needs    Medical: No    Non-medical: No  Tobacco Use  . Smoking status: Former Smoker    Quit date: 05/16/1975    Years since quitting: 43.9  . Smokeless tobacco: Never Used  Substance and Sexual Activity  . Alcohol use: Yes    Comment: a couple drink monthly  . Drug use: No  . Sexual activity: Not on file  Lifestyle  . Physical activity    Days per week: 0 days    Minutes per session: 0 min  . Stress: Not at all  Relationships  . Social Herbalist on phone: Patient refused    Gets together: Patient refused    Attends religious service: Patient refused    Active member of club or organization: Patient refused    Attends meetings of clubs or organizations: Patient refused    Relationship status: Patient refused  . Intimate partner violence    Fear of current or ex partner: Patient refused    Emotionally abused: Patient refused    Physically abused: Patient refused    Forced sexual activity: Patient refused  Other Topics Concern  . Not on file  Social History Narrative  . Not on file    Past Medical History:  Diagnosis Date  . Asthma   . Dyspnea    with colds  . GERD (gastroesophageal reflux disease)   .  Hyperlipidemia   . Pneumonia      Patient Active Problem List   Diagnosis Date Noted  . Rash and nonspecific skin eruption 03/28/2018  . Eczema 06/16/2017  . Snoring 03/27/2017  . Asthma 10/31/2014  . Abnormal LFTs 10/31/2014  . Blood glucose elevated 10/31/2014  . HLD (hyperlipidemia) 10/31/2014  . OP (osteoporosis) 10/31/2014  . Allergic rhinitis, seasonal 10/31/2014    Past Surgical History:  Procedure Laterality Date  . BREAST SURGERY Left 2000   biopsy  . CESAREAN SECTION  05/08/1978, 07/03/1979  . DILATION AND CURETTAGE OF UTERUS  1993  . FACIAL COSMETIC SURGERY  2006  .  SKIN SURGERY Right    hand 02/2016 x's 2 Dr. Nehemiah Massed    Her family history includes Bladder Cancer in her brother; Heart murmur in her mother; Vision loss in her father.   Current Outpatient Medications:  .  atorvastatin (LIPITOR) 20 MG tablet, TAKE 1 TABLET BY MOUTH EVERY DAY (Patient taking differently: Take 20 mg by mouth. Every other day), Disp: 30 tablet, Rfl: 11 .  Azelastine HCl 0.15 % SOLN, Place 1 spray into the nose 2 (two) times daily., Disp: 30 mL, Rfl: 3 .  Calcium Carb-Cholecalciferol (CALCIUM 600+D3) 600-800 MG-UNIT TABS, Take 1 tablet by mouth daily. 1200-800, Disp: , Rfl:  .  diphenhydrAMINE (BENADRYL) 25 MG tablet, Take 25 mg by mouth at bedtime as needed., Disp: , Rfl:  .  hydrOXYzine (ATARAX/VISTARIL) 10 MG tablet, Take 1 tablet (10 mg total) by mouth every 8 (eight) hours as needed for itching., Disp: 90 tablet, Rfl: 2 .  meloxicam (MOBIC) 7.5 MG tablet, Take 1 tablet (7.5 mg total) by mouth daily. (Patient taking differently: Take 7.5 mg by mouth as needed. ), Disp: 90 tablet, Rfl: 1 .  montelukast (SINGULAIR) 10 MG tablet, Take 1 tablet (10 mg total) by mouth daily. (Patient taking differently: Take 10 mg by mouth as needed. ), Disp: 90 tablet, Rfl: 3 .  SYMBICORT 80-4.5 MCG/ACT inhaler, INHALE 2 PUFFS INTO THE LUNGS TWICE DAILY (Patient taking differently: as needed. ), Disp: 10.2 g, Rfl: 5 .  triamcinolone cream (KENALOG) 0.1 %, Apply 1 application 2 (two) times daily topically. , Disp: , Rfl: 0  Patient Care Team: Virginia Crews, MD as PCP - General (Family Medicine) Marlyn Corporal Clearnce Sorrel, PA-C as Physician Assistant (Family Medicine) Ralene Bathe, MD as Consulting Physician (Dermatology) Eulogio Bear, MD as Consulting Physician (Ophthalmology)    Objective:    Vitals: BP 139/81 (BP Location: Left Arm, Patient Position: Sitting, Cuff Size: Normal)   Pulse 66   Temp (!) 97.1 F (36.2 C) (Temporal)   Resp 16   Ht 5' (1.524 m)   Wt 134 lb 12.8  oz (61.1 kg)   BMI 26.33 kg/m   Physical Exam Vitals signs reviewed.  Constitutional:      General: She is not in acute distress.    Appearance: Normal appearance. She is well-developed. She is not diaphoretic.  HENT:     Head: Normocephalic and atraumatic.     Right Ear: Tympanic membrane, ear canal and external ear normal.     Left Ear: Tympanic membrane, ear canal and external ear normal.  Eyes:     General: No scleral icterus.    Conjunctiva/sclera: Conjunctivae normal.     Pupils: Pupils are equal, round, and reactive to light.  Neck:     Musculoskeletal: Neck supple.     Thyroid: No thyromegaly.  Cardiovascular:  Rate and Rhythm: Normal rate and regular rhythm.     Pulses: Normal pulses.     Heart sounds: Normal heart sounds. No murmur.  Pulmonary:     Effort: Pulmonary effort is normal. No respiratory distress.     Breath sounds: Normal breath sounds. No wheezing or rales.  Abdominal:     General: There is no distension.     Palpations: Abdomen is soft.     Tenderness: There is no abdominal tenderness.  Musculoskeletal:        General: No deformity.     Right lower leg: No edema.     Left lower leg: No edema.  Lymphadenopathy:     Cervical: No cervical adenopathy.  Skin:    General: Skin is warm and dry.     Capillary Refill: Capillary refill takes less than 2 seconds.     Findings: No rash.  Neurological:     Mental Status: She is alert and oriented to person, place, and time. Mental status is at baseline.  Psychiatric:        Mood and Affect: Mood normal.        Behavior: Behavior normal.        Thought Content: Thought content normal.     Activities of Daily Living In your present state of health, do you have any difficulty performing the following activities: 04/05/2019 07/27/2018  Hearing? N N  Vision? N N  Difficulty concentrating or making decisions? N N  Walking or climbing stairs? N N  Dressing or bathing? N N  Doing errands, shopping? N -   Some recent data might be hidden    Fall Risk Assessment Fall Risk  04/05/2019 03/28/2018 03/27/2017 03/23/2016 03/23/2015  Falls in the past year? 1 0 No No No  Number falls in past yr: 0 - - - -  Injury with Fall? 1 - - - -  Follow up Falls evaluation completed - - - -     Depression Screen PHQ 2/9 Scores 04/05/2019 03/28/2018 03/27/2017 03/27/2017  PHQ - 2 Score 0 0 0 0  PHQ- 9 Score 0 - 0 -   Audit-C Alcohol Use Screening   Alcohol Use Disorder Test (AUDIT) 04/05/2019  1. How often do you have a drink containing alcohol? 2  2. How many drinks containing alcohol do you have on a typical day when you are drinking? 0  3. How often do you have six or more drinks on one occasion? 0  AUDIT-C Score 2    A score of 3 or more in women, and 4 or more in men indicates increased risk for alcohol abuse, EXCEPT if all of the points are from question 1     Assessment & Plan:     Annual Wellness Visit  Reviewed patient's Family Medical History Reviewed and updated list of patient's medical providers Assessment of cognitive impairment was done Assessed patient's functional ability Established a written schedule for health screening [place] Completed and Reviewed  Exercise Activities and Dietary recommendations Goals    . Exercise 3x per week (30 min per time)     Recommend to exercise for 3 days a week for at least 30 minutes at a time.      . Increase water intake     Recommend increasing water intake to 4-6 glasses a day.        Immunization History  Administered Date(s) Administered  . Influenza, High Dose Seasonal PF 03/01/2017, 01/23/2018  . Influenza-Unspecified  02/01/2016  . Pneumococcal Conjugate-13 04/16/2014  . Pneumococcal Polysaccharide-23 03/31/2008, 03/23/2016  . Tdap 06/03/2011  . Zoster 05/06/2009    Health Maintenance  Topic Date Due  . DEXA SCAN  05/19/2019  . MAMMOGRAM  04/20/2020  . Fecal DNA (Cologuard)  05/24/2020  .  TETANUS/TDAP  06/02/2021  . INFLUENZA VACCINE  Completed  . Hepatitis C Screening  Completed  . PNA vac Low Risk Adult  Completed     Discussed health benefits of physical activity, and encouraged her to engage in regular exercise appropriate for her age and condition.    ------------------------------------------------------------------------------------------------------------  Problem List Items Addressed This Visit      Other   Abnormal LFTs    Recheck CMP Followed by GI      Relevant Orders   CMP (Comprehensive metabolic panel)   CBC   Blood glucose elevated    Recheck A1c      Relevant Orders   Hemoglobin A1c   HLD (hyperlipidemia)    Continue qod atorvastatin Recheck FLP and CMP      Relevant Orders   Lipid panel   CMP (Comprehensive metabolic panel)   CBC   Hemoglobin A1c    Other Visit Diagnoses    Encounter for subsequent annual wellness visit (AWV) in Medicare patient    -  Primary       Return in about 1 year (around 04/04/2020) for AWV.   The entirety of the information documented in the History of Present Illness, Review of Systems and Physical Exam were personally obtained by me. Portions of this information were initially documented by Lynford Humphrey , CMA and reviewed by me for thoroughness and accuracy.    Bacigalupo, Dionne Bucy, MD MPH [facility]

## 2019-04-05 NOTE — Assessment & Plan Note (Signed)
Recheck CMP Followed by GI

## 2019-04-05 NOTE — Assessment & Plan Note (Signed)
Continue qod atorvastatin Recheck FLP and CMP

## 2019-04-05 NOTE — Assessment & Plan Note (Signed)
Recheck A1c 

## 2019-04-05 NOTE — Patient Instructions (Signed)
 The CDC recommends two doses of Shingrix (the shingles vaccine) separated by 2 to 6 months for adults age 74 years and older. I recommend checking with your insurance plan regarding coverage for this vaccine.     Preventive Care 65 Years and Older, Female Preventive care refers to lifestyle choices and visits with your health care provider that can promote health and wellness. This includes:  A yearly physical exam. This is also called an annual well check.  Regular dental and eye exams.  Immunizations.  Screening for certain conditions.  Healthy lifestyle choices, such as diet and exercise. What can I expect for my preventive care visit? Physical exam Your health care provider will check:  Height and weight. These may be used to calculate body mass index (BMI), which is a measurement that tells if you are at a healthy weight.  Heart rate and blood pressure.  Your skin for abnormal spots. Counseling Your health care provider may ask you questions about:  Alcohol, tobacco, and drug use.  Emotional well-being.  Home and relationship well-being.  Sexual activity.  Eating habits.  History of falls.  Memory and ability to understand (cognition).  Work and work environment.  Pregnancy and menstrual history. What immunizations do I need?  Influenza (flu) vaccine  This is recommended every year. Tetanus, diphtheria, and pertussis (Tdap) vaccine  You may need a Td booster every 10 years. Varicella (chickenpox) vaccine  You may need this vaccine if you have not already been vaccinated. Zoster (shingles) vaccine  You may need this after age 60. Pneumococcal conjugate (PCV13) vaccine  One dose is recommended after age 65. Pneumococcal polysaccharide (PPSV23) vaccine  One dose is recommended after age 65. Measles, mumps, and rubella (MMR) vaccine  You may need at least one dose of MMR if you were born in 1957 or later. You may also need a second  dose. Meningococcal conjugate (MenACWY) vaccine  You may need this if you have certain conditions. Hepatitis A vaccine  You may need this if you have certain conditions or if you travel or work in places where you may be exposed to hepatitis A. Hepatitis B vaccine  You may need this if you have certain conditions or if you travel or work in places where you may be exposed to hepatitis B. Haemophilus influenzae type b (Hib) vaccine  You may need this if you have certain conditions. You may receive vaccines as individual doses or as more than one vaccine together in one shot (combination vaccines). Talk with your health care provider about the risks and benefits of combination vaccines. What tests do I need? Blood tests  Lipid and cholesterol levels. These may be checked every 5 years, or more frequently depending on your overall health.  Hepatitis C test.  Hepatitis B test. Screening  Lung cancer screening. You may have this screening every year starting at age 55 if you have a 30-pack-year history of smoking and currently smoke or have quit within the past 15 years.  Colorectal cancer screening. All adults should have this screening starting at age 74 and continuing until age 75. Your health care provider may recommend screening at age 45 if you are at increased risk. You will have tests every 1-10 years, depending on your results and the type of screening test.  Diabetes screening. This is done by checking your blood sugar (glucose) after you have not eaten for a while (fasting). You may have this done every 1-3 years.  Mammogram. This may   be done every 1-2 years. Talk with your health care provider about how often you should have regular mammograms.  BRCA-related cancer screening. This may be done if you have a family history of breast, ovarian, tubal, or peritoneal cancers. Other tests  Sexually transmitted disease (STD) testing.  Bone density scan. This is done to screen for  osteoporosis. You may have this done starting at age 65. Follow these instructions at home: Eating and drinking  Eat a diet that includes fresh fruits and vegetables, whole grains, lean protein, and low-fat dairy products. Limit your intake of foods with high amounts of sugar, saturated fats, and salt.  Take vitamin and mineral supplements as recommended by your health care provider.  Do not drink alcohol if your health care provider tells you not to drink.  If you drink alcohol: ? Limit how much you have to 0-1 drink a day. ? Be aware of how much alcohol is in your drink. In the [place] one drink equals one 12 oz bottle of beer (355 mL), one 5 oz glass of wine (148 mL), or one 1 oz glass of hard liquor (44 mL). Lifestyle  Take daily care of your teeth and gums.  Stay active. Exercise for at least 30 minutes on 5 or more days each week.  Do not use any products that contain nicotine or tobacco, such as cigarettes, e-cigarettes, and chewing tobacco. If you need help quitting, ask your health care provider.  If you are sexually active, practice safe sex. Use a condom or other form of protection in order to prevent STIs (sexually transmitted infections).  Talk with your health care provider about taking a low-dose aspirin or statin. What's next?  Go to your health care provider once a year for a well check visit.  Ask your health care provider how often you should have your eyes and teeth checked.  Stay up to date on all vaccines. This information is not intended to replace advice given to you by your health care provider. Make sure you discuss any questions you have with your health care provider. Document Released: 05/29/2015 Document Revised: 04/26/2018 Document Reviewed: 04/26/2018 Elsevier Patient Education  2020 [employer].

## 2019-04-06 LAB — CBC
Hematocrit: 41.5 % (ref 34.0–46.6)
Hemoglobin: 14.3 g/dL (ref 11.1–15.9)
MCH: 29.3 pg (ref 26.6–33.0)
MCHC: 34.5 g/dL (ref 31.5–35.7)
MCV: 85 fL (ref 79–97)
Platelets: 209 10*3/uL (ref 150–450)
RBC: 4.88 x10E6/uL (ref 3.77–5.28)
RDW: 13 % (ref 11.7–15.4)
WBC: 5.9 10*3/uL (ref 3.4–10.8)

## 2019-04-06 LAB — COMPREHENSIVE METABOLIC PANEL
ALT: 75 IU/L — ABNORMAL HIGH (ref 0–32)
AST: 69 IU/L — ABNORMAL HIGH (ref 0–40)
Albumin/Globulin Ratio: 2.1 (ref 1.2–2.2)
Albumin: 4.6 g/dL (ref 3.7–4.7)
Alkaline Phosphatase: 154 IU/L — ABNORMAL HIGH (ref 39–117)
BUN/Creatinine Ratio: 25 (ref 12–28)
BUN: 19 mg/dL (ref 8–27)
Bilirubin Total: 0.5 mg/dL (ref 0.0–1.2)
CO2: 25 mmol/L (ref 20–29)
Calcium: 9.5 mg/dL (ref 8.7–10.3)
Chloride: 103 mmol/L (ref 96–106)
Creatinine, Ser: 0.76 mg/dL (ref 0.57–1.00)
GFR calc Af Amer: 89 mL/min/{1.73_m2} (ref >59)
GFR calc non Af Amer: 78 mL/min/{1.73_m2} (ref >59)
Globulin, Total: 2.2 g/dL (ref 1.5–4.5)
Glucose: 102 mg/dL — ABNORMAL HIGH (ref 65–99)
Potassium: 4.6 mmol/L (ref 3.5–5.2)
Sodium: 142 mmol/L (ref 134–144)
Total Protein: 6.8 g/dL (ref 6.0–8.5)

## 2019-04-06 LAB — LIPID PANEL
Chol/HDL Ratio: 1.9 ratio (ref 0.0–4.4)
Cholesterol, Total: 144 mg/dL (ref 100–199)
HDL: 75 mg/dL (ref >39)
LDL Chol Calc (NIH): 56 mg/dL (ref 0–99)
Triglycerides: 62 mg/dL (ref 0–149)
VLDL Cholesterol Cal: 13 mg/dL (ref 5–40)

## 2019-04-06 LAB — HEMOGLOBIN A1C
Est. average glucose Bld gHb Est-mCnc: 111 mg/dL
Hgb A1c MFr Bld: 5.5 % (ref 4.8–5.6)

## 2019-04-08 ENCOUNTER — Encounter: Payer: Self-pay | Admitting: Family Medicine

## 2019-04-08 ENCOUNTER — Telehealth: Payer: Self-pay

## 2019-04-08 IMAGING — MG DIGITAL SCREENING BILATERAL MAMMOGRAM WITH TOMO AND CAD
8 series · 9 of 24 positions shown · non-contrast
Comparison: Previous exam(s).

CLINICAL DATA: Screening.

EXAM:
DIGITAL SCREENING BILATERAL MAMMOGRAM WITH TOMO AND CAD

[R CC synth-2D]
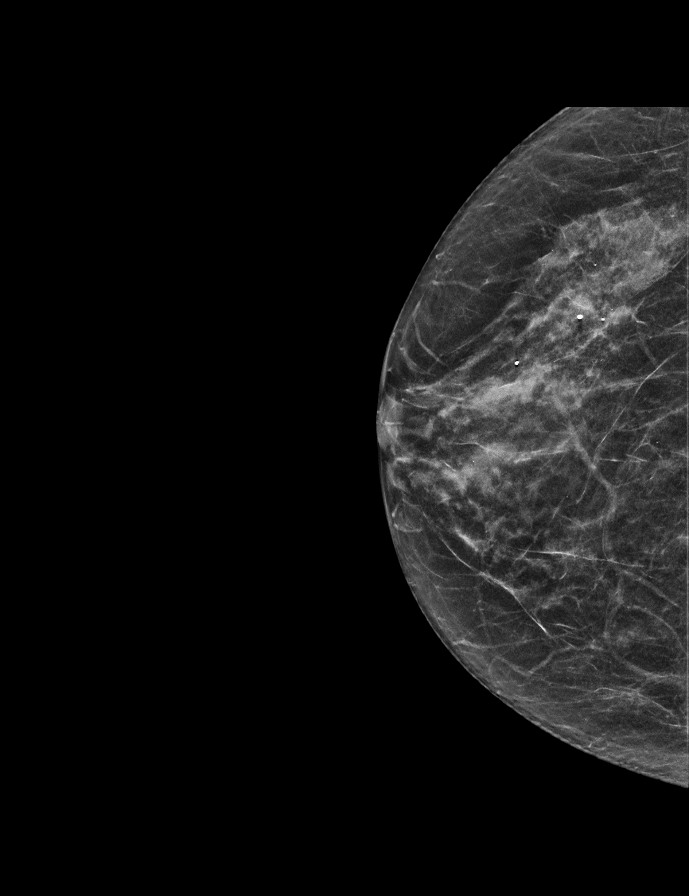

[L CC synth-2D]
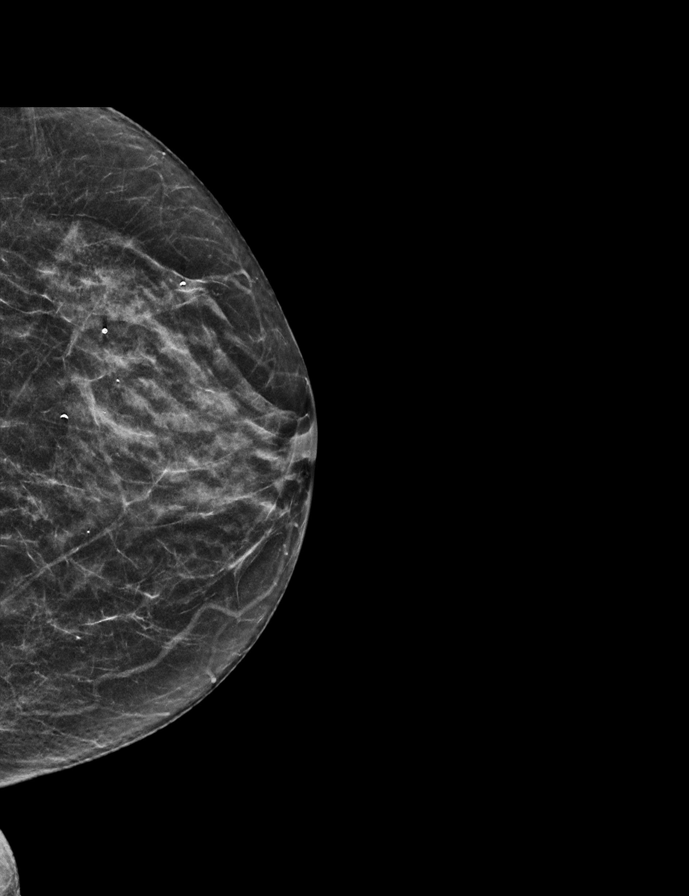

[R MLO synth-2D]
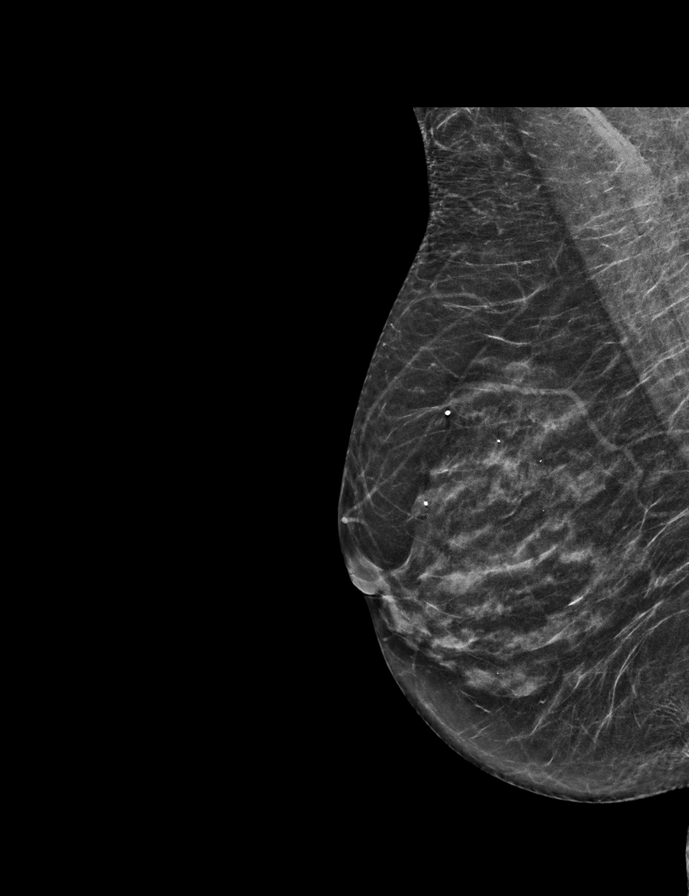

[L MLO synth-2D]
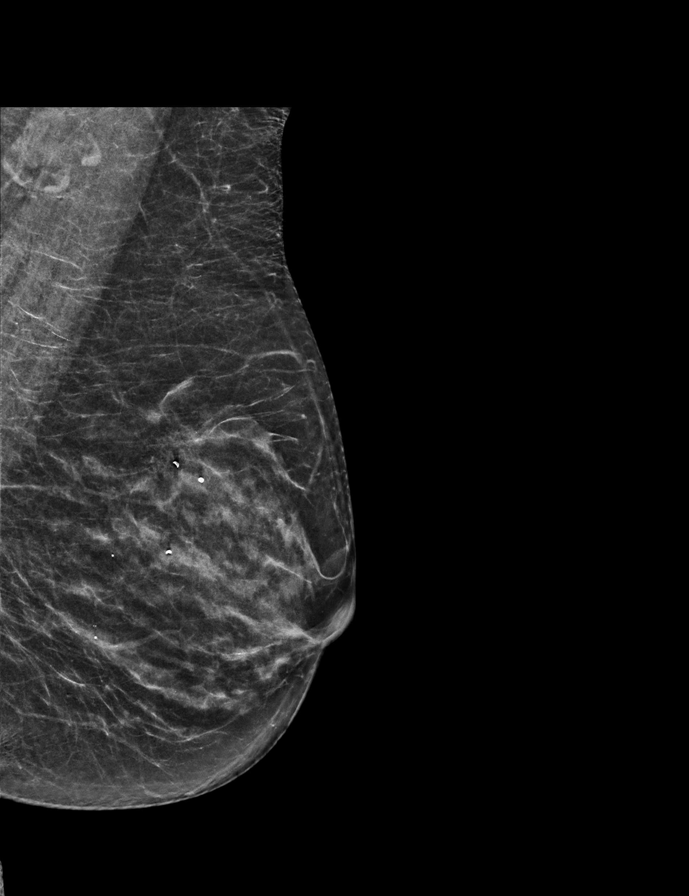

[R CC tomo · 2 of 49 frames shown]
[frame 16/49]
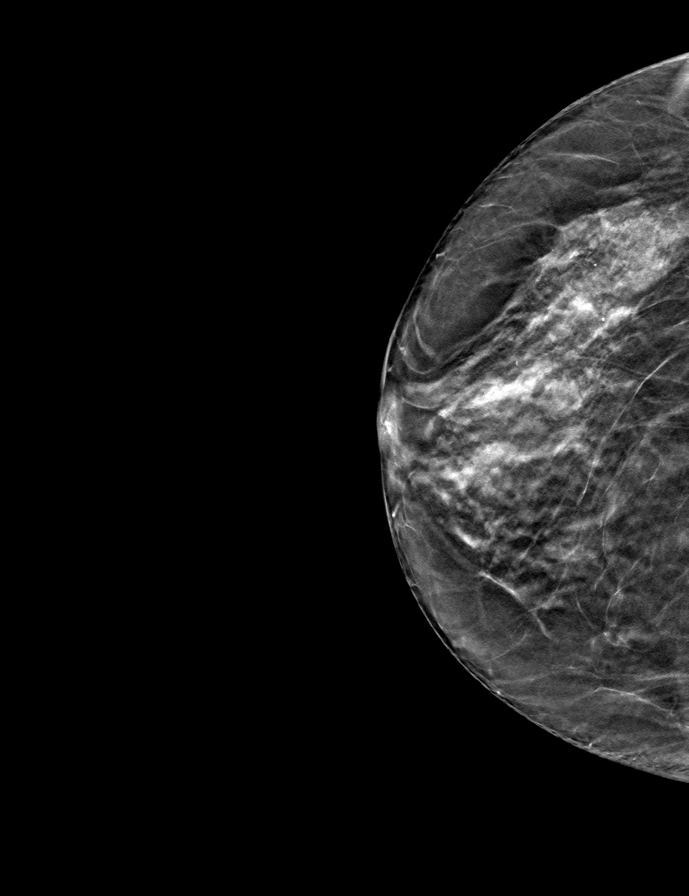
[frame 25/49]
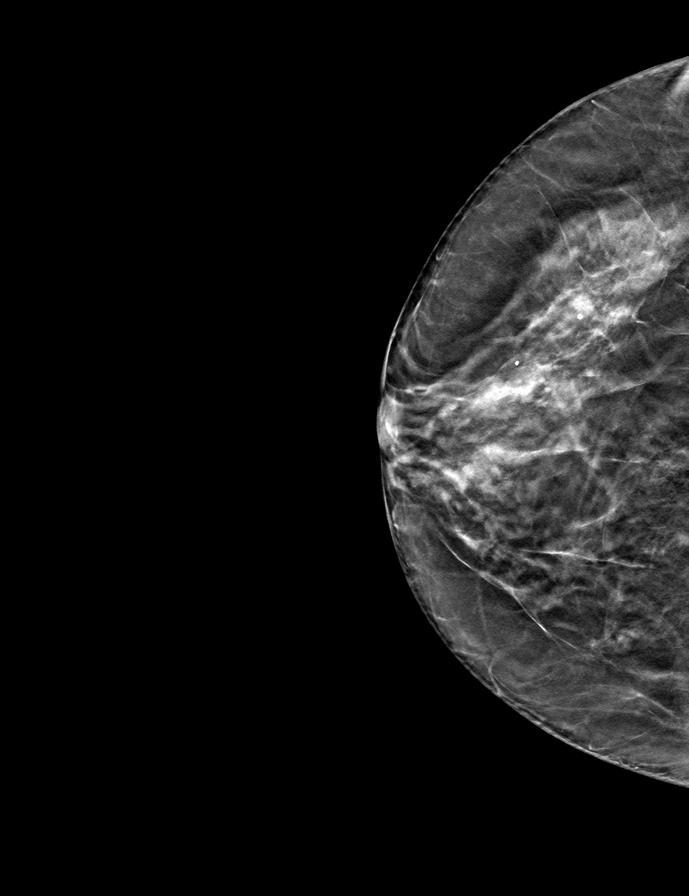

[L CC tomo · tomo slice 24/47.0]
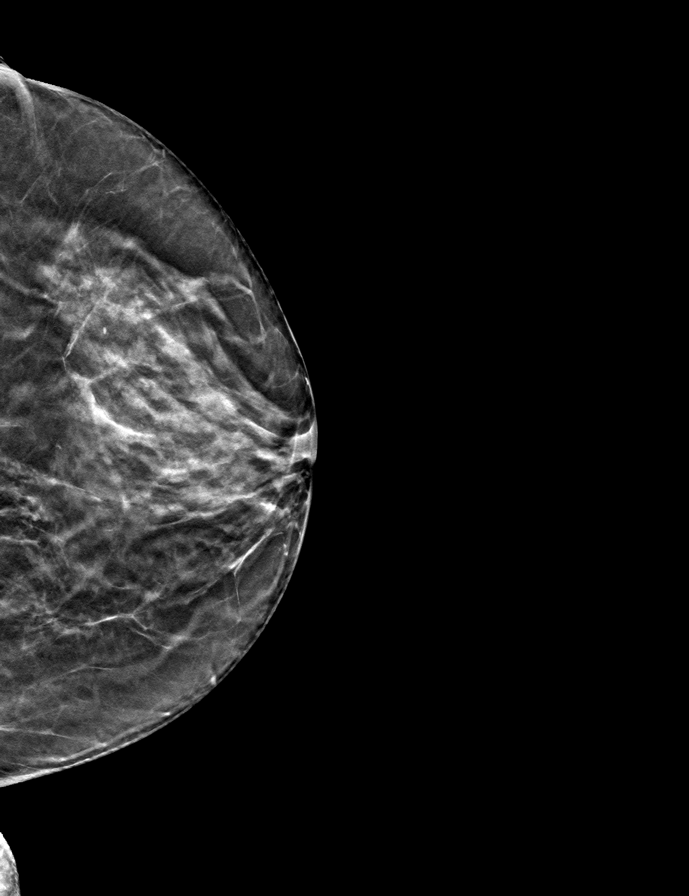

[L MLO tomo · tomo slice 27/52.0]
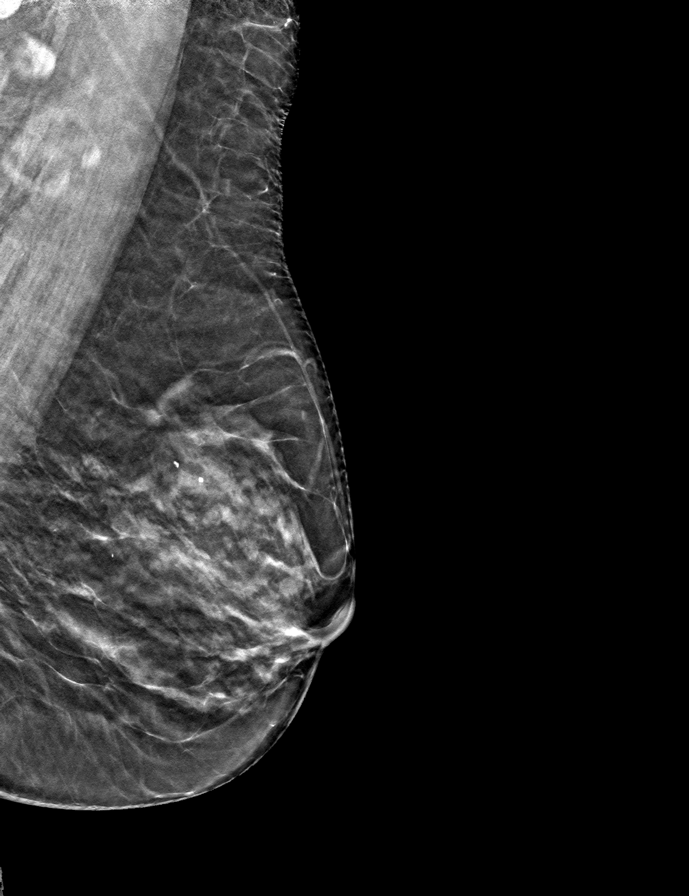

[R MLO tomo · tomo slice 27/53.0]
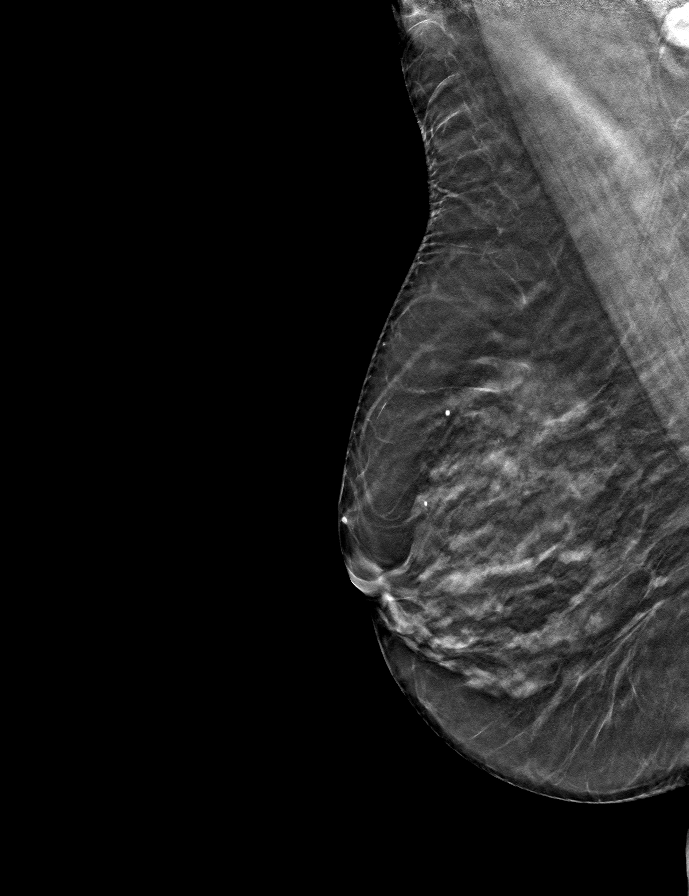

[9 of 24 positions shown; findings below may reference images not displayed]

ACR Breast Density Category c: The breast tissue is heterogeneously
dense, which may obscure small masses.
FINDINGS: There are no findings suspicious for malignancy. Images were
processed with CAD.
IMPRESSION: No mammographic evidence of malignancy. A result letter of this
screening mammogram will be mailed directly to the patient.

RECOMMENDATION:
Screening mammogram in one year. (Code:[id number])

BI-RADS CATEGORY  1: Negative.

## 2019-04-08 NOTE — Telephone Encounter (Signed)
-----   Message from Virginia Crews, MD sent at 04/08/2019 12:32 PM EST ----- Normal labs, except liver function tests have elevated. Recommend following up with your gastroenterologist.

## 2019-04-08 NOTE — Telephone Encounter (Signed)
Viewed by Iline Oven on 04/08/2019 12:41 PM

## 2019-04-09 ENCOUNTER — Encounter: Payer: Self-pay | Admitting: Family Medicine

## 2019-04-10 ENCOUNTER — Other Ambulatory Visit: Payer: Self-pay

## 2019-04-10 ENCOUNTER — Encounter: Payer: Self-pay | Admitting: Family Medicine

## 2019-04-18 DIAGNOSIS — J342 Deviated nasal septum: Secondary | ICD-10-CM | POA: Diagnosis not present

## 2019-04-18 DIAGNOSIS — J3489 Other specified disorders of nose and nasal sinuses: Secondary | ICD-10-CM | POA: Diagnosis not present

## 2019-04-18 DIAGNOSIS — J309 Allergic rhinitis, unspecified: Secondary | ICD-10-CM | POA: Diagnosis not present

## 2019-04-18 DIAGNOSIS — J301 Allergic rhinitis due to pollen: Secondary | ICD-10-CM | POA: Diagnosis not present

## 2019-04-20 ENCOUNTER — Encounter: Payer: Self-pay | Admitting: Family Medicine

## 2019-04-21 ENCOUNTER — Encounter: Payer: Self-pay | Admitting: Family Medicine

## 2019-04-22 ENCOUNTER — Ambulatory Visit: Payer: Self-pay | Admitting: *Deleted

## 2019-04-22 NOTE — Telephone Encounter (Signed)
From [facility] 

## 2019-04-22 NOTE — Telephone Encounter (Signed)
Patient was seen at physical and had elevated BP- Patient advised to monitor her BP at home and call back for follow up. Patient states she has had elevations- calling to follow up.  Appointment scheduled- patient prefers to be seen in office- COVID screening negative for symptoms.  Reason for Disposition . Systolic BP  >= 0000000 OR Diastolic >= [id number]  Answer Assessment - Initial Assessment Questions 1. BLOOD PRESSURE: "What is the blood pressure?" "Did you take at least two measurements 5 minutes apart?"     161/96 P 106, 142/93 P 104 2. ONSET: "When did you take your blood pressure?"     9:30, 9:38 3. HOW: "How did you obtain the blood pressure?" (e.g., visiting nurse, automatic home BP monitor)     Automatic cuff 4. HISTORY: "Do you have a history of high blood pressure?"     no 5. MEDICATIONS: "Are you taking any medications for blood pressure?" "Have you missed any doses recently?"     No BP medications 6. OTHER SYMPTOMS: "Do you have any symptoms?" (e.g., headache, chest pain, blurred vision, difficulty breathing, weakness)     At night she has mucus in her throat- hard to swallow, slight headache 7. PREGNANCY: "Is there any chance you are pregnant?" "When was your last menstrual period?"     n/a  Protocols used: HIGH BLOOD PRESSURE-A-AH

## 2019-04-22 NOTE — Telephone Encounter (Signed)
FYI

## 2019-04-23 ENCOUNTER — Encounter: Payer: Self-pay | Admitting: Family Medicine

## 2019-04-23 ENCOUNTER — Ambulatory Visit (INDEPENDENT_AMBULATORY_CARE_PROVIDER_SITE_OTHER): Payer: Medicare Other | Admitting: Family Medicine

## 2019-04-23 ENCOUNTER — Ambulatory Visit: Payer: Self-pay | Admitting: Family Medicine

## 2019-04-23 ENCOUNTER — Other Ambulatory Visit: Payer: Self-pay

## 2019-04-23 VITALS — BP 149/80 | HR 85 | Temp 97.1°F | Wt 136.0 lb

## 2019-04-23 DIAGNOSIS — I10 Essential (primary) hypertension: Secondary | ICD-10-CM | POA: Diagnosis not present

## 2019-04-23 MED ORDER — AMLODIPINE BESYLATE 5 MG PO TABS: 5.0000 mg | ORAL_TABLET | Freq: Every day | ORAL | 3 refills | Status: DC

## 2019-04-23 NOTE — Progress Notes (Signed)
Patient: Cheryl Hays Female    DOB: July 14, 1945   73 y.o.   MRN: [id number] Visit Date: 04/23/2019  Today's Provider: Lavon Paganini, MD   Chief Complaint  Patient presents with  . Hypertension   Subjective:     Hypertension This is a new problem. The problem has been gradually worsening since onset. The problem is uncontrolled. Associated symptoms include anxiety and headaches. Pertinent negatives include no blurred vision, chest pain, malaise/fatigue, neck pain, orthopnea, palpitations, peripheral edema, PND, shortness of breath or sweats. There are no associated agents to hypertension.   She is exercising regularly and eating a low-sodium diet She has never been diagnosed with hypertension in the past, but home blood pressures remain elevated She has never taken a medicine for hypertension  Allergies  Allergen Reactions  . Celecoxib   . Levofloxacin Other (See Comments)    Muscle aches  . Sulfa Antibiotics   . Pseudoephedrine Rash    Current Outpatient Medications:  .  atorvastatin (LIPITOR) 20 MG tablet, TAKE 1 TABLET BY MOUTH EVERY DAY (Patient taking differently: Take 20 mg by mouth. Every other day), Disp: 30 tablet, Rfl: 11 .  Calcium Carb-Cholecalciferol (CALCIUM 600+D3) 600-800 MG-UNIT TABS, Take 1 tablet by mouth daily. 1200-800, Disp: , Rfl:  .  diphenhydrAMINE (BENADRYL) 25 MG tablet, Take 25 mg by mouth at bedtime as needed., Disp: , Rfl:  .  fluticasone (FLONASE) 50 MCG/ACT nasal spray, Place 2 sprays into both nostrils daily., Disp: , Rfl:  .  hydrOXYzine (ATARAX/VISTARIL) 10 MG tablet, Take 1 tablet (10 mg total) by mouth every 8 (eight) hours as needed for itching., Disp: 90 tablet, Rfl: 2 .  meloxicam (MOBIC) 7.5 MG tablet, Take 1 tablet (7.5 mg total) by mouth daily. (Patient taking differently: Take 7.5 mg by mouth as needed. ), Disp: 90 tablet, Rfl: 1 .  montelukast (SINGULAIR) 10 MG tablet, Take 1 tablet (10 mg total) by mouth daily. (Patient  taking differently: Take 10 mg by mouth as needed. ), Disp: 90 tablet, Rfl: 3 .  SYMBICORT 80-4.5 MCG/ACT inhaler, INHALE 2 PUFFS INTO THE LUNGS TWICE DAILY (Patient taking differently: as needed. ), Disp: 10.2 g, Rfl: 5 .  triamcinolone cream (KENALOG) 0.1 %, Apply 1 application 2 (two) times daily topically. , Disp: , Rfl: 0  Review of Systems  Constitutional: Negative.  Negative for malaise/fatigue.  HENT: Positive for postnasal drip (Especially at night). Negative for congestion, ear discharge, ear pain, facial swelling, sinus pressure, sinus pain, sneezing, sore throat and trouble swallowing.   Eyes: Negative for blurred vision.  Respiratory: Negative.  Negative for shortness of breath.   Cardiovascular: Negative.  Negative for chest pain, palpitations, orthopnea and PND.  Gastrointestinal: Negative.   Musculoskeletal: Negative for neck pain.  Neurological: Positive for headaches. Negative for dizziness and light-headedness.  Psychiatric/Behavioral: Negative.     Social History   Tobacco Use  . Smoking status: Former Smoker    Quit date: 05/16/1975    Years since quitting: 43.9  . Smokeless tobacco: Never Used  Substance Use Topics  . Alcohol use: Yes    Comment: a couple drink monthly      Objective:   BP (!) 149/80 (BP Location: Left Arm, Patient Position: Sitting, Cuff Size: Large)   Pulse 85   Temp (!) 97.1 F (36.2 C) (Temporal)   Wt 136 lb (61.7 kg)   BMI 26.56 kg/m  Vitals:   04/23/19 0801  BP: (!) 149/80  Pulse:  85  Temp: (!) 97.1 F (36.2 C)  TempSrc: Temporal  Weight: 136 lb (61.7 kg)  Body mass index is 26.56 kg/m.   Physical Exam Vitals signs reviewed.  Constitutional:      General: She is not in acute distress.    Appearance: Normal appearance. She is well-developed. She is not diaphoretic.  HENT:     Head: Normocephalic and atraumatic.  Eyes:     General: No scleral icterus.    Conjunctiva/sclera: Conjunctivae normal.  Neck:      Musculoskeletal: Neck supple.     Thyroid: No thyromegaly.  Cardiovascular:     Rate and Rhythm: Normal rate and regular rhythm.     Pulses: Normal pulses.     Heart sounds: Normal heart sounds. No murmur.  Pulmonary:     Effort: Pulmonary effort is normal. No respiratory distress.     Breath sounds: Normal breath sounds. No wheezing, rhonchi or rales.  Musculoskeletal:     Right lower leg: No edema.     Left lower leg: No edema.  Lymphadenopathy:     Cervical: No cervical adenopathy.  Skin:    General: Skin is warm and dry.     Capillary Refill: Capillary refill takes less than 2 seconds.     Findings: No rash.  Neurological:     Mental Status: She is alert and oriented to person, place, and time. Mental status is at baseline.  Psychiatric:        Mood and Affect: Mood normal.        Behavior: Behavior normal.      No results found for any visits on 04/23/19.     Assessment & Plan    Problem List Items Addressed This Visit      Cardiovascular and Mediastinum   Essential hypertension - Primary    New diagnosis Asymptomatic with no red flags Blood pressure has been elevated for the last 2 visits and at home Reviewed recent metabolic panel Discussed lifestyle modifications, but she already exercises regularly and eats a low-sodium diet We will start amlodipine 5 mg daily Discussed possible side effects, including leg swelling Follow-up in 4 to 6 weeks and consider dose titration      Relevant Medications   amLODipine (NORVASC) 5 MG tablet       Return in about 4 weeks (around 05/21/2019) for chronic disease f/u.   The entirety of the information documented in the History of Present Illness, Review of Systems and Physical Exam were personally obtained by me. Portions of this information were initially documented by Ashley Royalty, CMA and reviewed by me for thoroughness and accuracy.    Zyia Kaneko, Dionne Bucy, MD MPH [facility]

## 2019-04-23 NOTE — Assessment & Plan Note (Signed)
New diagnosis Asymptomatic with no red flags Blood pressure has been elevated for the last 2 visits and at home Reviewed recent metabolic panel Discussed lifestyle modifications, but she already exercises regularly and eats a low-sodium diet We will start amlodipine 5 mg daily Discussed possible side effects, including leg swelling Follow-up in 4 to 6 weeks and consider dose titration

## 2019-04-23 NOTE — Patient Instructions (Signed)

## 2019-04-24 ENCOUNTER — Encounter: Payer: Self-pay | Admitting: Family Medicine

## 2019-04-30 ENCOUNTER — Ambulatory Visit
Admission: RE | Admit: 2019-04-30 | Discharge: 2019-04-30 | Disposition: A | Discharge status: Home / Self Care | Payer: Medicare Other | Source: Ambulatory Visit | Attending: Family Medicine | Admitting: Family Medicine

## 2019-04-30 ENCOUNTER — Other Ambulatory Visit: Payer: Self-pay

## 2019-04-30 DIAGNOSIS — Z1231 Encounter for screening mammogram for malignant neoplasm of breast: Secondary | ICD-10-CM

## 2019-05-01 ENCOUNTER — Telehealth: Payer: Self-pay

## 2019-05-01 NOTE — Telephone Encounter (Signed)
-----   Message from Virginia Crews, MD sent at 05/01/2019 12:41 PM EST ----- Normal mammogram. Repeat in 1 yr

## 2019-05-01 NOTE — Telephone Encounter (Signed)
Seen by patient Gen Koffman on 05/01/2019 12:42 PM EST

## 2019-05-15 ENCOUNTER — Encounter: Payer: Self-pay | Admitting: Family Medicine

## 2019-05-16 ENCOUNTER — Telehealth: Payer: Self-pay

## 2019-05-16 MED ORDER — ATORVASTATIN CALCIUM 20 MG PO TABS: 20.0000 mg | ORAL_TABLET | Freq: Every day | ORAL | 11 refills | Status: DC

## 2019-05-16 NOTE — Telephone Encounter (Signed)
Patient send message via MyChart requesting a refill on her Atorvastatin 20 MG. L.O.V. was on 04/23/2019 and upcoming appointment is on 06/13/2019. Medication send into pharmacy.

## 2019-05-20 ENCOUNTER — Encounter: Payer: Self-pay | Admitting: Family Medicine

## 2019-05-21 ENCOUNTER — Encounter: Payer: Self-pay | Admitting: Family Medicine

## 2019-06-03 ENCOUNTER — Ambulatory Visit: Payer: Self-pay | Admitting: *Deleted

## 2019-06-03 NOTE — Telephone Encounter (Signed)
   Reason for Disposition . General information question, no triage required and triager able to answer question  Answer Assessment - Initial Assessment Questions 1. REASON FOR CALL or QUESTION: "What is your reason for calling today?" or "How can I best help you?" or "What question do you have that I can help answer?"     She got her first [place] COVID-19 vaccine on 1/5 in [place].   She was wanting to know if she could come to [facility] for her 2nd shot.    I asked her why just curious and she said because they don't give you a time in [place] just a date.   They tell you to come early.   I waiting in the car for 2 hours to get my shot.   So I was wondering about coming to [place] they schedule you a time. I let her know our vaccine slots were full through February so I advised her to keep the 06/11/2019 date she had scheduled with the [facility] so she would get her 2nd dose on time.  She was agreeable to this and thanked me very much for my help.  Protocols used: INFORMATION ONLY CALL-A-AH

## 2019-06-13 ENCOUNTER — Ambulatory Visit: Payer: Medicare Other | Admitting: Family Medicine

## 2019-06-20 DIAGNOSIS — L821 Other seborrheic keratosis: Secondary | ICD-10-CM | POA: Diagnosis not present

## 2019-06-20 DIAGNOSIS — L299 Pruritus, unspecified: Secondary | ICD-10-CM | POA: Diagnosis not present

## 2019-06-20 DIAGNOSIS — L820 Inflamed seborrheic keratosis: Secondary | ICD-10-CM | POA: Diagnosis not present

## 2019-06-20 DIAGNOSIS — Z85828 Personal history of other malignant neoplasm of skin: Secondary | ICD-10-CM | POA: Diagnosis not present

## 2019-06-20 DIAGNOSIS — L578 Other skin changes due to chronic exposure to nonionizing radiation: Secondary | ICD-10-CM | POA: Diagnosis not present

## 2019-06-27 DIAGNOSIS — H2513 Age-related nuclear cataract, bilateral: Secondary | ICD-10-CM | POA: Diagnosis not present

## 2019-06-28 ENCOUNTER — Ambulatory Visit (INDEPENDENT_AMBULATORY_CARE_PROVIDER_SITE_OTHER): Payer: PPO | Admitting: Family Medicine

## 2019-06-28 ENCOUNTER — Other Ambulatory Visit: Payer: Self-pay

## 2019-06-28 ENCOUNTER — Encounter: Payer: Self-pay | Admitting: Family Medicine

## 2019-06-28 VITALS — BP 135/81 | HR 73 | Temp 96.8°F | Wt 135.0 lb

## 2019-06-28 DIAGNOSIS — R739 Hyperglycemia, unspecified: Secondary | ICD-10-CM | POA: Diagnosis not present

## 2019-06-28 DIAGNOSIS — R7989 Other specified abnormal findings of blood chemistry: Secondary | ICD-10-CM

## 2019-06-28 DIAGNOSIS — R945 Abnormal results of liver function studies: Secondary | ICD-10-CM | POA: Diagnosis not present

## 2019-06-28 DIAGNOSIS — I10 Essential (primary) hypertension: Secondary | ICD-10-CM | POA: Diagnosis not present

## 2019-06-28 MED ORDER — FLUTICASONE PROPIONATE 50 MCG/ACT NA SUSP: 2.0000 | Freq: Every day | NASAL | 11 refills | Status: AC

## 2019-06-28 NOTE — Patient Instructions (Signed)
Liver Function Tests Why am I having this test? Liver function tests are done to see how well your liver is working. The proteins and enzymes measured in the test can alert your health care provider to inflammation, damage, or disease in your liver. It is common to have liver function tests:  When you are taking certain medicines.  If you have liver disease.  If you drink a lot of alcohol.  When you are not feeling well.  When you have other conditions that may affect your liver.  During annual physical exams.  If you have symptoms such as yellowing of the skin (jaundice), abdominal pain, or nausea and vomiting. What is being tested? These tests measure various substances in your blood. This may include:  Alanine transaminase (ALT). This is an enzyme in the liver.  Aspartate transaminase (AST). This is an enzyme in the liver, heart, and muscles.  Alkaline phosphatase (ALP). This is a protein in the liver, bile ducts, bone, and other body tissues.  Total bilirubin. This is a yellow pigment in bile.  Albumin. This is a protein in the liver.  Prothrombin time and international normalized ratio (PT and INR). PT measures the time it takes for your blood to clot. INR is a calculation of blood clotting time based on your PT result. It is also calculated based on normal ranges defined by the lab that processed your test.  Total protein. This includes two proteins, albumin and globulin, found in the blood. What kind of sample is taken?  A blood sample is required for this test. It is usually collected by inserting a needle into a blood vessel. How do I prepare for this test? How you prepare will depend on which tests are being done and the reason for doing them. You may need to:  Avoid eating for 4-6 hours before the test, or as told by your health care provider.  Stop taking certain medicines before your blood test, as told by your health care provider. Tell a health care provider  about:  All medicines you are taking, including vitamins, herbs, eye drops, creams, and over-the-counter medicines.  Any medical conditions you have.  Whether you are pregnant or may be pregnant. How are the results reported? Your test results will be reported as values. Your health care provider will compare your results to normal ranges that were established after testing a large group of people (reference ranges). Reference ranges may vary among labs and hospitals. For the substances measured in liver function tests, common reference ranges are: ALT  Infant: 10-40 international units/L.  Child or adult: 4-36 international units/L at 37C or 4-36 units/L (SI units).  Reference ranges may be higher for older adults. AST  Newborn 44-22 days old: 35-140 units/L.  Child younger than 92 years old: 15-60 units/L.  56-71 years old: 15-50 units/L.  97-7 years old: 10-50 units/L.  57-57 years old: 10-40 units/L.  Adult: 0-35 units/L or 0-0.58 microkatals/L (SI units).  Reference ranges may be higher for older adults. ALP  Child younger than 61 years old: 85-235 units/L.  20-66 years old: 65-210 units/L.  11-67 years old: 60-300 units/L.  1-26 years old: 30-200 units/L.  Adult: 30-120 units/L or 0.5-2.0 microkatals/L (SI units).  Reference ranges may be higher for older adults. Total bilirubin  Newborn: 1.0-12.0 mg/dL or 17.1-205 micromoles/L (SI units).  Child or adult: 0.3-1.0 mg/dL or 5.1-17 micromoles/L. Albumin  Premature infant: 3.0-4.2 g/dL.  Newborn: 3.5-5.4 g/dL.  Infant: 4.4-5.4 g/dL.  Child:  4.0-5.9 g/dL.  Adult: 3.5-5.0 g/dL or 35-50 g/L (SI units). PT  11.0-12.5 seconds; 85%-100%. INR  0.8-1.1. Total protein  Premature infant: 4.2-7.6 g/dL.  Newborn: 4.6-7.4 g/dL.  Infant: 6.0-6.7 g/dL.  Child: 6.2-8.0 g/dL.  Adult: 6.4-8.3 g/dL or 64-83 g/L (SI units). What do the results mean? Results that are within the reference ranges are considered  normal. For each substance measured, results outside the reference range can indicate various health issues. ALT  Levels above the normal range may indicate liver disease. AST  Levels above the normal range may indicate liver disease. Sometimes levels also increase after burns, surgery, heart attack, muscle damage, or seizure. ALP  Levels above the normal range may be seen in biliary obstruction, liver diseases, bone disease, thyroid disease, tumors, fractures, leukemia, lymphoma, or several other conditions. People with blood type O or B may show higher levels after a fatty meal.  Levels below the normal range may indicate bone and teeth conditions, malnutrition, protein deficiency, or Wilson's disease. Total bilirubin  Levels above the normal range may indicate problems with the liver, gallbladder, or bile ducts. Albumin  Levels above the normal range may indicate dehydration. They may also be caused by a diet that is high in protein.  Levels below the normal range may indicate kidney disease, liver disease, or malabsorption of nutrients. PT and INR  Levels above the normal range mean that your blood is clotting slower than normal. This may be due to blood disorders, liver disorders, or low levels of vitamin K. Total protein  Levels above the normal range may be due to infection or other diseases.  Levels below the normal range may be due to an immune system disorder, bleeding, burns, kidney disorder, liver disease, trouble absorbing or getting nutrients, or other conditions that affect the intestines. Talk with your health care provider about what your results mean. Questions to ask your health care provider Ask your health care provider, or the department that is doing the test:  When will my results be ready?  How will I get my results?  What are my treatment options?  What other tests do I need?  What are my next steps? Summary  Liver function tests are done to see  how well your liver is working.  These tests measure various proteins and enzymes in your blood. The results can alert your health care provider to inflammation, damage, or disease in your liver.  Talk with your health care provider about what your results mean. This information is not intended to replace advice given to you by your health care provider. Make sure you discuss any questions you have with your health care provider. Document Revised: 12/20/2017 Document Reviewed: 02/14/2017 Elsevier Patient Education  [place].

## 2019-06-28 NOTE — Assessment & Plan Note (Signed)
Has previously been evaluated by GI for possible PBC Liver biopsy was done and was nonspecific Tried ursodiol but was unable to tolerate Alk phos is staying around baseline AST and ALT had elevated at last visit She believes this may be related to her flu shot She has abstained from alcohol since her last visit Recheck today

## 2019-06-28 NOTE — Progress Notes (Signed)
Patient: Cheryl Hays Female    DOB: 05/02/1946   74 y.o.   MRN: [id number] Visit Date: 06/28/2019  Today's Provider: Lavon Paganini, MD   Chief Complaint  Patient presents with  . Hypertension   Subjective:     HPI   Due for labs to recheck liver.     Hypertension, follow-up:  BP Readings from Last 3 Encounters:  06/28/19 135/81  04/23/19 (!) 149/80  04/05/19 139/81    She was last seen for hypertension 6 weeks ago.  BP at that visit was 148/80. Management since that visit includes Started Amlodipine '5mg'$ . She reports excellent compliance with treatment. She is not having side effects.  She is exercising. She is adherent to low salt diet.   Outside blood pressures are 120s/70's. She is experiencing none.  Patient denies chest pain, claudication, dyspnea, exertional chest pressure/discomfort, lower extremity edema and palpitations.   Cardiovascular risk factors include advanced age (older than 10 for men, 37 for women) and hypertension.  Use of agents associated with hypertension: none.     Weight trend: stable Wt Readings from Last 3 Encounters:  06/28/19 135 lb (61.2 kg)  04/23/19 136 lb (61.7 kg)  04/05/19 134 lb 12.8 oz (61.1 kg)    Current diet: in general, a "healthy" diet    ------------------------------------------------------------------------    Allergies  Allergen Reactions  . Celecoxib   . Levofloxacin Other (See Comments)    Muscle aches  . Sulfa Antibiotics   . Pseudoephedrine Rash     Current Outpatient Medications:  .  amLODipine (NORVASC) 5 MG tablet, Take 1 tablet (5 mg total) by mouth daily., Disp: 30 tablet, Rfl: 3 .  atorvastatin (LIPITOR) 20 MG tablet, Take 1 tablet (20 mg total) by mouth daily., Disp: 30 tablet, Rfl: 11 .  Calcium Carb-Cholecalciferol (CALCIUM 600+D3) 600-800 MG-UNIT TABS, Take 1 tablet by mouth daily. 1200-800, Disp: , Rfl:  .  fluticasone (FLONASE) 50 MCG/ACT nasal spray, Place 2 sprays into  both nostrils daily., Disp: , Rfl:  .  hydrOXYzine (ATARAX/VISTARIL) 10 MG tablet, Take 1 tablet (10 mg total) by mouth every 8 (eight) hours as needed for itching., Disp: 90 tablet, Rfl: 2 .  meloxicam (MOBIC) 7.5 MG tablet, Take 1 tablet (7.5 mg total) by mouth daily. (Patient taking differently: Take 7.5 mg by mouth as needed. ), Disp: 90 tablet, Rfl: 1 .  montelukast (SINGULAIR) 10 MG tablet, Take 1 tablet (10 mg total) by mouth daily. (Patient taking differently: Take 10 mg by mouth as needed. ), Disp: 90 tablet, Rfl: 3 .  SYMBICORT 80-4.5 MCG/ACT inhaler, INHALE 2 PUFFS INTO THE LUNGS TWICE DAILY (Patient taking differently: as needed. ), Disp: 10.2 g, Rfl: 5 .  triamcinolone cream (KENALOG) 0.1 %, Apply 1 application 2 (two) times daily topically. , Disp: , Rfl: 0 .  diphenhydrAMINE (BENADRYL) 25 MG tablet, Take 25 mg by mouth at bedtime as needed., Disp: , Rfl:   Review of Systems  Constitutional: Negative.   Respiratory: Negative.   Cardiovascular: Positive for leg swelling. Negative for chest pain and palpitations.  Gastrointestinal: Negative.   Neurological: Negative for dizziness, light-headedness and headaches.    Social History   Tobacco Use  . Smoking status: Former Smoker    Quit date: 05/16/1975    Years since quitting: 44.1  . Smokeless tobacco: Never Used  Substance Use Topics  . Alcohol use: Yes    Comment: a couple drink monthly  Objective:   BP 135/81 (BP Location: Left Arm, Patient Position: Sitting, Cuff Size: Normal)   Pulse 73   Temp (!) 96.8 F (36 C) (Temporal)   Wt 135 lb (61.2 kg)   BMI 26.37 kg/m  Vitals:   06/28/19 1057  BP: 135/81  Pulse: 73  Temp: (!) 96.8 F (36 C)  TempSrc: Temporal  Weight: 135 lb (61.2 kg)  Body mass index is 26.37 kg/m.   Physical Exam Vitals reviewed.  Constitutional:      General: She is not in acute distress.    Appearance: Normal appearance. She is well-developed. She is not diaphoretic.  HENT:      Head: Normocephalic and atraumatic.  Eyes:     General: No scleral icterus.    Conjunctiva/sclera: Conjunctivae normal.  Neck:     Thyroid: No thyromegaly.  Cardiovascular:     Rate and Rhythm: Normal rate and regular rhythm.     Pulses: Normal pulses.     Heart sounds: Normal heart sounds. No murmur.  Pulmonary:     Effort: Pulmonary effort is normal. No respiratory distress.     Breath sounds: Normal breath sounds. No wheezing, rhonchi or rales.  Abdominal:     General: There is no distension.     Palpations: Abdomen is soft.     Tenderness: There is no abdominal tenderness.  Musculoskeletal:     Cervical back: Neck supple.     Right lower leg: No edema.     Left lower leg: No edema.  Lymphadenopathy:     Cervical: No cervical adenopathy.  Skin:    General: Skin is warm and dry.     Capillary Refill: Capillary refill takes less than 2 seconds.     Findings: No rash.  Neurological:     Mental Status: She is alert and oriented to person, place, and time. Mental status is at baseline.  Psychiatric:        Mood and Affect: Mood normal.        Behavior: Behavior normal.      No results found for any visits on 06/28/19.     Assessment & Plan    Problem List Items Addressed This Visit      Cardiovascular and Mediastinum   Essential hypertension - Primary    Well controlled Continue current medications Recheck metabolic panel F/u in 6 months       Relevant Orders   Comprehensive metabolic panel     Other   Abnormal LFTs    Has previously been evaluated by GI for possible PBC Liver biopsy was done and was nonspecific Tried ursodiol but was unable to tolerate Alk phos is staying around baseline AST and ALT had elevated at last visit She believes this may be related to her flu shot She has abstained from alcohol since her last visit Recheck today      Relevant Orders   Comprehensive metabolic panel   Blood glucose elevated    A1c was normal at last visit          Follow-up as scheduled for CPE  The entirety of the information documented in the History of Present Illness, Review of Systems and Physical Exam were personally obtained by me. Portions of this information were initially documented by Ashley Royalty, CMA and reviewed by me for thoroughness and accuracy.    Bacigalupo, Dionne Bucy, MD MPH [facility]

## 2019-06-28 NOTE — Assessment & Plan Note (Signed)
Well controlled Continue current medications Recheck metabolic panel F/u in 6 months  

## 2019-06-28 NOTE — Assessment & Plan Note (Signed)
A1c was normal at last visit

## 2019-06-29 LAB — COMPREHENSIVE METABOLIC PANEL
ALT: 24 IU/L (ref 0–32)
AST: 24 IU/L (ref 0–40)
Albumin/Globulin Ratio: 1.8 (ref 1.2–2.2)
Albumin: 4.3 g/dL (ref 3.7–4.7)
Alkaline Phosphatase: 111 IU/L (ref 39–117)
BUN/Creatinine Ratio: 21 (ref 12–28)
BUN: 16 mg/dL (ref 8–27)
Bilirubin Total: 0.5 mg/dL (ref 0.0–1.2)
CO2: 24 mmol/L (ref 20–29)
Calcium: 9.7 mg/dL (ref 8.7–10.3)
Chloride: 102 mmol/L (ref 96–106)
Creatinine, Ser: 0.76 mg/dL (ref 0.57–1.00)
GFR calc Af Amer: 89 mL/min/{1.73_m2} (ref >59)
GFR calc non Af Amer: 77 mL/min/{1.73_m2} (ref >59)
Globulin, Total: 2.4 g/dL (ref 1.5–4.5)
Glucose: 97 mg/dL (ref 65–99)
Potassium: 4.2 mmol/L (ref 3.5–5.2)
Sodium: 141 mmol/L (ref 134–144)
Total Protein: 6.7 g/dL (ref 6.0–8.5)

## 2019-07-01 ENCOUNTER — Telehealth: Payer: Self-pay

## 2019-07-01 ENCOUNTER — Encounter: Payer: Self-pay | Admitting: Family Medicine

## 2019-07-01 NOTE — Telephone Encounter (Signed)
[employer]   Result Notes and Comments to Patient Comment seen by patient Gen Butters on 07/01/2019 8:29 AM EST

## 2019-07-01 NOTE — Telephone Encounter (Signed)
-----   Message from Virginia Crews, MD sent at 07/01/2019  8:28 AM EST ----- Liver function tests are back to normal

## 2019-08-07 ENCOUNTER — Encounter: Payer: Self-pay | Admitting: Family Medicine

## 2019-08-08 MED ORDER — LISINOPRIL 20 MG PO TABS: 20.0000 mg | ORAL_TABLET | Freq: Every day | ORAL | 3 refills | Status: DC

## 2019-08-08 NOTE — Addendum Note (Signed)
Addended by: Virginia Crews on: 08/08/2019 10:28 AM   Modules accepted: Orders

## 2019-10-10 ENCOUNTER — Other Ambulatory Visit: Payer: Self-pay | Admitting: Family Medicine

## 2019-10-10 NOTE — Telephone Encounter (Signed)
[facility] faxed refill request for the following medications:   amLODipine BESYLATE 5 MG tablet  Please advise.  Thanks, [employer]

## 2019-10-11 MED ORDER — LISINOPRIL 20 MG PO TABS: 20.0000 mg | ORAL_TABLET | Freq: Every day | ORAL | 0 refills | Status: DC

## 2019-10-11 NOTE — Telephone Encounter (Signed)
Spoke to patient to clarify refill for amlodipine. Patient reports that she could not remember the correct name of medication. I review office notes and noted that amlodipine was discontinued and Lisinopril was started. Lisinopril sent in to pharmacy. Patient aware. sd

## 2019-12-10 ENCOUNTER — Other Ambulatory Visit: Payer: Self-pay

## 2019-12-12 ENCOUNTER — Other Ambulatory Visit: Payer: Self-pay | Admitting: Family Medicine

## 2019-12-12 MED ORDER — LISINOPRIL 20 MG PO TABS: 20.0000 mg | ORAL_TABLET | Freq: Every day | ORAL | 0 refills | Status: DC

## 2019-12-12 NOTE — Telephone Encounter (Signed)
Medication Refill - Medication: lisinopril 90 day  Has the patient contacted their pharmacy? No. (Agent: If no, request that the patient contact the pharmacy for the refill.) (Agent: If yes, when and what did the pharmacy advise?)  Preferred Pharmacy (with phone number or [facility] name): [place], [place]  Agent: Please be advised that RX refills may take up to 3 business days. We ask that you follow-up with your pharmacy.

## 2020-01-10 ENCOUNTER — Other Ambulatory Visit: Payer: Self-pay | Admitting: Family Medicine

## 2020-01-21 ENCOUNTER — Encounter: Payer: Self-pay | Admitting: Family Medicine

## 2020-01-22 MED ORDER — LISINOPRIL 20 MG PO TABS: 20.0000 mg | ORAL_TABLET | Freq: Every day | ORAL | 1 refills | Status: DC

## 2020-02-03 ENCOUNTER — Ambulatory Visit: Payer: PPO | Attending: Internal Medicine

## 2020-02-03 DIAGNOSIS — Z23 Encounter for immunization: Secondary | ICD-10-CM

## 2020-02-03 NOTE — Progress Notes (Signed)
   Covid-19 Vaccination Clinic  Name:  Cheryl Hays    MRN: [id number] DOB: [birth year]  02/03/2020  Cheryl Hays was observed post Covid-19 immunization for 15 minutes without incident. She was provided with Vaccine Information Sheet and instruction to access the V-Safe system.   Cheryl Hays was instructed to call [phone] with any severe reactions post vaccine: Marland Kitchen Difficulty breathing  . Swelling of face and throat  . A fast heartbeat  . A bad rash all over body  . Dizziness and weakness

## 2020-03-12 DIAGNOSIS — Z85828 Personal history of other malignant neoplasm of skin: Secondary | ICD-10-CM | POA: Diagnosis not present

## 2020-03-12 DIAGNOSIS — L299 Pruritus, unspecified: Secondary | ICD-10-CM | POA: Diagnosis not present

## 2020-03-12 DIAGNOSIS — L570 Actinic keratosis: Secondary | ICD-10-CM | POA: Diagnosis not present

## 2020-03-12 DIAGNOSIS — L578 Other skin changes due to chronic exposure to nonionizing radiation: Secondary | ICD-10-CM | POA: Diagnosis not present

## 2020-03-12 DIAGNOSIS — L821 Other seborrheic keratosis: Secondary | ICD-10-CM | POA: Diagnosis not present

## 2020-03-12 DIAGNOSIS — L814 Other melanin hyperpigmentation: Secondary | ICD-10-CM | POA: Diagnosis not present

## 2020-03-12 DIAGNOSIS — D492 Neoplasm of unspecified behavior of bone, soft tissue, and skin: Secondary | ICD-10-CM | POA: Diagnosis not present

## 2020-03-12 DIAGNOSIS — L280 Lichen simplex chronicus: Secondary | ICD-10-CM | POA: Diagnosis not present

## 2020-03-12 DIAGNOSIS — L209 Atopic dermatitis, unspecified: Secondary | ICD-10-CM | POA: Diagnosis not present

## 2020-03-12 DIAGNOSIS — L720 Epidermal cyst: Secondary | ICD-10-CM | POA: Diagnosis not present

## 2020-03-16 ENCOUNTER — Other Ambulatory Visit: Payer: Self-pay | Admitting: Family Medicine

## 2020-03-16 DIAGNOSIS — Z1231 Encounter for screening mammogram for malignant neoplasm of breast: Secondary | ICD-10-CM

## 2020-04-06 ENCOUNTER — Encounter: Payer: Self-pay | Admitting: Family Medicine

## 2020-04-23 ENCOUNTER — Encounter: Payer: Self-pay | Admitting: Family Medicine

## 2020-04-23 ENCOUNTER — Ambulatory Visit (INDEPENDENT_AMBULATORY_CARE_PROVIDER_SITE_OTHER): Payer: PPO | Admitting: Family Medicine

## 2020-04-23 ENCOUNTER — Other Ambulatory Visit: Payer: Self-pay

## 2020-04-23 VITALS — BP 120/80 | HR 69 | Temp 98.2°F | Resp 16 | Wt 131.6 lb

## 2020-04-23 DIAGNOSIS — J4521 Mild intermittent asthma with (acute) exacerbation: Secondary | ICD-10-CM

## 2020-04-23 DIAGNOSIS — R059 Cough, unspecified: Secondary | ICD-10-CM

## 2020-04-23 MED ORDER — PREDNISONE 20 MG PO TABS: 40.0000 mg | ORAL_TABLET | Freq: Every day | ORAL | 0 refills | Status: AC

## 2020-04-23 NOTE — Patient Instructions (Signed)
The CDC recommends two doses of Shingrix (the shingles vaccine) separated by 2 to 6 months for adults age 75 years and older. I recommend checking with your insurance plan regarding coverage for this vaccine.      http://www.aaaai.org/conditions-and-treatments/asthma">  Asthma, Adult  Asthma is a long-term (chronic) condition that causes recurrent episodes in which the airways become tight and narrow. The airways are the passages that lead from the nose and mouth down into the lungs. Asthma episodes, also called asthma attacks, can cause coughing, wheezing, shortness of breath, and chest pain. The airways can also fill with mucus. During an attack, it can be difficult to breathe. Asthma attacks can range from minor to life threatening. Asthma cannot be cured, but medicines and lifestyle changes can help control it and treat acute attacks. What are the causes? This condition is believed to be caused by inherited (genetic) and environmental factors, but its exact cause is not known. There are many things that can bring on an asthma attack or make asthma symptoms worse (triggers). Asthma triggers are different for each person. Common triggers include:  Mold.  Dust.  Cigarette smoke.  Cockroaches.  Things that can cause allergy symptoms (allergens), such as animal dander or pollen from trees or grass.  Air pollutants such as household cleaners, wood smoke, smog, or Advertising account planner.  Cold air, weather changes, and winds (which increase molds and pollen in the air).  Strong emotional expressions such as crying or laughing hard.  Stress.  Certain medicines (such as aspirin) or types of medicines (such as beta-blockers).  Sulfites in foods and drinks. Foods and drinks that may contain sulfites include dried fruit, potato chips, and sparkling grape juice.  Infections or inflammatory conditions such as the flu, a cold, or inflammation of the nasal membranes (rhinitis).  Gastroesophageal  reflux disease (GERD).  Exercise or strenuous activity. What are the signs or symptoms? Symptoms of this condition may occur right after asthma is triggered or many hours later. Symptoms include:  Wheezing. This can sound like whistling when you breathe.  Excessive nighttime or early morning coughing.  Frequent or severe coughing with a common cold.  Chest tightness.  Shortness of breath.  Tiredness (fatigue) with minimal activity. How is this diagnosed? This condition is diagnosed based on:  Your medical history.  A physical exam.  Tests, which may include: ? Lung function studies and pulmonary studies (spirometry). These tests can evaluate the flow of air in your lungs. ? Allergy tests. ? Imaging tests, such as X-rays. How is this treated? There is no cure for this condition, but treatment can help control your symptoms. Treatment for asthma usually involves:  Identifying and avoiding your asthma triggers.  Using medicines to control your symptoms. Generally, two types of medicines are used to treat asthma: ? Controller medicines. These help prevent asthma symptoms from occurring. They are usually taken every day. ? Fast-acting reliever or rescue medicines. These quickly relieve asthma symptoms by widening the narrow and tight airways. They are used as needed and provide short-term relief.  Using supplemental oxygen. This may be needed during a severe episode.  Using other medicines, such as: ? Allergy medicines, such as antihistamines, if your asthma attacks are triggered by allergens. ? Immune medicines (immunomodulators). These are medicines that help control the immune system.  Creating an asthma action plan. An asthma action plan is a written plan for managing and treating your asthma attacks. This plan includes: ? A list of your asthma triggers and  how to avoid them. ? Information about when medicines should be taken and when their dosage should be  changed. ? Instructions about using a device called a peak flow meter. A peak flow meter measures how well the lungs are working and the severity of your asthma. It helps you monitor your condition. Follow these instructions at home: Controlling your home environment Control your home environment in the following ways to help avoid triggers and prevent asthma attacks:  Change your heating and air conditioning filter regularly.  Limit your use of fireplaces and wood stoves.  Get rid of pests (such as roaches and mice) and their droppings.  Throw away plants if you see mold on them.  Clean floors and dust surfaces regularly. Use unscented cleaning products.  Try to have someone else vacuum for you regularly. Stay out of rooms while they are being vacuumed and for a short while afterward. If you vacuum, use a dust mask from a hardware store, a double-layered or microfilter vacuum cleaner bag, or a vacuum cleaner with a HEPA filter.  Replace carpet with wood, tile, or vinyl flooring. Carpet can trap dander and dust.  Use allergy-proof pillows, mattress covers, and box spring covers.  Keep your bedroom a trigger-free room.  Avoid pets and keep windows closed when allergens are in the air.  Wash beddings every week in hot water and dry them in a dryer.  Use blankets that are made of polyester or cotton.  Clean bathrooms and kitchens with bleach. If possible, have someone repaint the walls in these rooms with mold-resistant paint. Stay out of the rooms that are being cleaned and painted.  Wash your hands often with soap and water. If soap and water are not available, use hand sanitizer.  Do not allow anyone to smoke in your home. General instructions  Take over-the-counter and prescription medicines only as told by your health care provider. ? Speak with your health care provider if you have questions about how or when to take the medicines. ? Make note if you are requiring more  frequent dosages.  Do not use any products that contain nicotine or tobacco, such as cigarettes and e-cigarettes. If you need help quitting, ask your health care provider. Also, avoid being exposed to secondhand smoke.  Use a peak flow meter as told by your health care provider. Record and keep track of the readings.  Understand and use the asthma action plan to help minimize, or stop an asthma attack, without needing to seek medical care.  Make sure you stay up to date on your yearly vaccinations as told by your health care provider. This may include vaccines for the flu and pneumonia.  Avoid outdoor activities when allergen counts are high and when air quality is low.  Wear a ski mask that covers your nose and mouth during outdoor winter activities. Exercise indoors on cold days if you can.  Warm up before exercising, and take time for a cool-down period after exercise.  Keep all follow-up visits as told by your health care provider. This is important. Where to find more information  For information about asthma, turn to the Centers for Disease Control and Prevention at http://www.clark.net/.htm  For air quality information, turn to [employer] at WeightRating.nl Contact a health care provider if:  You have wheezing, shortness of breath, or a cough even while you are taking medicine to prevent attacks.  The mucus you cough up (sputum) is thicker than usual.  Your sputum changes from clear  or white to yellow, green, gray, or bloody.  Your medicines are causing side effects, such as a rash, itching, swelling, or trouble breathing.  You need to use a reliever medicine more than 2-3 times a week.  Your peak flow reading is still at 50-79% of your personal best after following your action plan for 1 hour.  You have a fever. Get help right away if:  You are getting worse and do not respond to treatment during an asthma attack.  You are short of breath when at rest or when doing  very little physical activity.  You have difficulty eating, drinking, or talking.  You have chest pain or tightness.  You develop a fast heartbeat or palpitations.  You have a bluish color to your lips or fingernails.  You are light-headed or dizzy, or you faint.  Your peak flow reading is less than 50% of your personal best.  You feel too tired to breathe normally. Summary  Asthma is a long-term (chronic) condition that causes recurrent episodes in which the airways become tight and narrow. These episodes can cause coughing, wheezing, shortness of breath, and chest pain.  Asthma cannot be cured, but medicines and lifestyle changes can help control it and treat acute attacks.  Make sure you understand how to avoid triggers and how and when to use your medicines.  Asthma attacks can range from minor to life threatening. Get help right away if you have an asthma attack and do not respond to treatment with your usual rescue medicines. This information is not intended to replace advice given to you by your health care provider. Make sure you discuss any questions you have with your health care provider. Document Revised: 07/05/2018 Document Reviewed: 06/06/2016 Elsevier Patient Education  2020 [employer].

## 2020-04-23 NOTE — Assessment & Plan Note (Signed)
Acute worse at night, in the context of recent URI Improving over last 5 days Rule out COVID

## 2020-04-23 NOTE — Progress Notes (Signed)
Established patient visit   Patient: Cheryl Hays   DOB: 1945/09/07   74 y.o. Female  MRN: [id number] Visit Date: 04/23/2020  Today's healthcare provider: Lavon Paganini, MD   Chief Complaint  Patient presents with  . Groin Pain   Subjective    HPI   Over the last nine days she has experienced sore throat, clear rhinorrhea, couch with wheezing at night, she has been taking Nyquil frequently along with sinus rinses and Flonase. The cough has been improving over the last 2-3 days. She describes the cough as wet and can' feel chest congestion'. She takes albuterol 2x in the mornings and evenings to some benefit. She does not report fevers, chills, or N/V/D.   She first notice a lump while exercising two weeks ago. It was in her L groin area and prominent and palpable.She examined it in the shower and found it to be non-painful, with no observable head or skin changes. The lump disappeared within 3 days.      Social History   Tobacco Use  . Smoking status: Former Smoker    Quit date: 05/16/1975    Years since quitting: 44.9  . Smokeless tobacco: Never Used  Vaping Use  . Vaping Use: Never used  Substance Use Topics  . Alcohol use: Yes    Comment: a couple drink monthly  . Drug use: No       Medications: Outpatient Medications Prior to Visit  Medication Sig  . atorvastatin (LIPITOR) 20 MG tablet Take 1 tablet (20 mg total) by mouth daily.  . Calcium Carb-Cholecalciferol 600-800 MG-UNIT TABS Take 1 tablet by mouth daily. 1200-800  . fluticasone (FLONASE) 50 MCG/ACT nasal spray Place 2 sprays into both nostrils daily.  . hydrOXYzine (ATARAX/VISTARIL) 10 MG tablet Take 1 tablet (10 mg total) by mouth every 8 (eight) hours as needed for itching.  Marland Kitchen lisinopril (ZESTRIL) 20 MG tablet Take 1 tablet (20 mg total) by mouth daily.  . meloxicam (MOBIC) 7.5 MG tablet Take 1 tablet (7.5 mg total) by mouth daily. (Patient taking differently: Take 7.5 mg by mouth as needed.)  .  montelukast (SINGULAIR) 10 MG tablet Take 1 tablet (10 mg total) by mouth daily. (Patient taking differently: Take 10 mg by mouth as needed.)  . SYMBICORT 80-4.5 MCG/ACT inhaler INHALE 2 PUFFS INTO THE LUNGS TWICE DAILY (Patient taking differently: as needed.)  . triamcinolone cream (KENALOG) 0.1 % Apply 1 application 2 (two) times daily topically.    No facility-administered medications prior to visit.    Review of Systems  Constitutional: Negative.   HENT: Positive for congestion, postnasal drip and rhinorrhea.   Eyes: Negative.   Respiratory: Positive for chest tightness and wheezing.   Cardiovascular: Negative.   Gastrointestinal: Negative.   Endocrine: Negative.   Genitourinary: Negative.   Musculoskeletal: Negative.   Skin: Negative.   Allergic/Immunologic: Negative.   Neurological: Negative.   Hematological: Negative.   Psychiatric/Behavioral: Negative.        Objective    BP 120/80 (BP Location: Left Arm, Patient Position: Sitting, Cuff Size: Large)   Pulse 69   Temp 98.2 F (36.8 C) (Oral)   Resp 16   Wt 131 lb 9.6 oz (59.7 kg)   SpO2 96%   BMI 25.70 kg/m     Physical Exam   General: NAD Lungs: Diffuse inspiratory and expiratory coarse wheezes appreciated. Cards: RRR, no murmurs rubs or gallops  GI: Soft non-tender  Derm:  Non masses appreciated on palpation of  L groin, no skin changes, tenderness or fluctuance.   No results found for any visits on 04/23/20.  Assessment & Plan     Problem List Items Addressed This Visit      Respiratory   Asthma - Primary    Chronic, mild and intermittent, controlled with Singulair and albuterol as needed 9 days out from a cold, chronic congestion Diffuse inspiratory and expiratory wheezing Using albuterol 2x BID Likely related to post nasal drip from recent URI This is caused asthma exacerbation but now requires prednisone for treatment 5-7 day course of prednisone 40 mg.  Test for Covid to ensure that this was  not the viral URI that triggered this, but patient is vaccinated      Relevant Medications   predniSONE (DELTASONE) 20 MG tablet     Other   Cough    Acute worse at night, in the context of recent URI Improving over last 5 days Rule out COVID      Relevant Orders   Novel Coronavirus, NAA ([facility])      Return for as scheduled.      Patient seen along with [employer] student [employer]. I personally evaluated this patient along with the student, and verified all aspects of the history, physical exam, and medical decision making as documented by the student. I agree with the student's documentation and have made all necessary edits.  Lena Gores, Dionne Bucy, MD, MPH [facility]

## 2020-04-23 NOTE — Assessment & Plan Note (Addendum)
Chronic, mild and intermittent, controlled with Singulair and albuterol as needed 9 days out from a cold, chronic congestion Diffuse inspiratory and expiratory wheezing Using albuterol 2x BID Likely related to post nasal drip from recent URI This is caused asthma exacerbation but now requires prednisone for treatment 5-7 day course of prednisone 40 mg.  Test for Covid to ensure that this was not the viral URI that triggered this, but patient is vaccinated

## 2020-04-24 ENCOUNTER — Encounter: Payer: Self-pay | Admitting: Family Medicine

## 2020-04-24 LAB — SARS-COV-2, NAA 2 DAY TAT

## 2020-04-24 LAB — NOVEL CORONAVIRUS, NAA: SARS-CoV-2, NAA: NOT DETECTED

## 2020-04-30 ENCOUNTER — Ambulatory Visit
Admission: RE | Admit: 2020-04-30 | Discharge: 2020-04-30 | Disposition: A | Discharge status: Home / Self Care | Payer: PPO | Source: Ambulatory Visit | Attending: Family Medicine | Admitting: Family Medicine

## 2020-04-30 ENCOUNTER — Other Ambulatory Visit: Payer: Self-pay

## 2020-04-30 DIAGNOSIS — Z1231 Encounter for screening mammogram for malignant neoplasm of breast: Secondary | ICD-10-CM | POA: Diagnosis not present

## 2020-05-26 ENCOUNTER — Other Ambulatory Visit: Payer: Self-pay

## 2020-05-26 ENCOUNTER — Encounter: Payer: Self-pay | Admitting: Family Medicine

## 2020-05-26 ENCOUNTER — Ambulatory Visit (INDEPENDENT_AMBULATORY_CARE_PROVIDER_SITE_OTHER): Payer: PPO | Admitting: Family Medicine

## 2020-05-26 VITALS — BP 139/85 | HR 89 | Temp 98.3°F | Ht 60.0 in | Wt 136.3 lb

## 2020-05-26 DIAGNOSIS — R739 Hyperglycemia, unspecified: Secondary | ICD-10-CM | POA: Diagnosis not present

## 2020-05-26 DIAGNOSIS — R7989 Other specified abnormal findings of blood chemistry: Secondary | ICD-10-CM

## 2020-05-26 DIAGNOSIS — R945 Abnormal results of liver function studies: Secondary | ICD-10-CM | POA: Diagnosis not present

## 2020-05-26 DIAGNOSIS — Z0000 Encounter for general adult medical examination without abnormal findings: Secondary | ICD-10-CM | POA: Diagnosis not present

## 2020-05-26 DIAGNOSIS — Z1211 Encounter for screening for malignant neoplasm of colon: Secondary | ICD-10-CM

## 2020-05-26 DIAGNOSIS — I10 Essential (primary) hypertension: Secondary | ICD-10-CM

## 2020-05-26 DIAGNOSIS — M810 Age-related osteoporosis without current pathological fracture: Secondary | ICD-10-CM

## 2020-05-26 DIAGNOSIS — E7800 Pure hypercholesterolemia, unspecified: Secondary | ICD-10-CM | POA: Diagnosis not present

## 2020-05-26 MED ORDER — CETIRIZINE HCL 10 MG PO TABS: 10.0000 mg | ORAL_TABLET | Freq: Every day | ORAL | 3 refills | Status: DC

## 2020-05-26 NOTE — Progress Notes (Signed)
Established patient visit   Patient: Cheryl Hays   DOB: 20-Jul-1945   75 y.o. Female  MRN: [id number] Visit Date: 05/26/2020  Today's healthcare provider: Lavon Paganini, MD   Chief Complaint  Patient presents with  . Annual Exam   Subjective    HPI  Well Adult Physical: Patient here for a comprehensive physical exam.The patient reports no problems Do you take any herbs or supplements that were not prescribed by a doctor? yes Are you taking calcium supplements? yes Are you taking aspirin daily? no GU History: LMP: No LMP recorded. Patient is postmenopausal. Menopause at unknown years. Last pap date: unknown. Abnormal pap? no     Social History   Tobacco Use  . Smoking status: Former Smoker    Quit date: 05/16/1975    Years since quitting: 45.0  . Smokeless tobacco: Never Used  Vaping Use  . Vaping Use: Never used  Substance Use Topics  . Alcohol use: Yes    Comment: a couple drink monthly  . Drug use: No       Medications: Outpatient Medications Prior to Visit  Medication Sig Note  . atorvastatin (LIPITOR) 20 MG tablet Take 1 tablet (20 mg total) by mouth daily. 05/26/2020: Pt takes 1 tablet every other day.  . Calcium Carb-Cholecalciferol 600-800 MG-UNIT TABS Take 1 tablet by mouth daily. 1200-800   . fluticasone (FLONASE) 50 MCG/ACT nasal spray Place 2 sprays into both nostrils daily.   . hydrOXYzine (ATARAX/VISTARIL) 10 MG tablet Take 1 tablet (10 mg total) by mouth every 8 (eight) hours as needed for itching.   Marland Kitchen lisinopril (ZESTRIL) 20 MG tablet Take 1 tablet (20 mg total) by mouth daily.   . meloxicam (MOBIC) 7.5 MG tablet Take 1 tablet (7.5 mg total) by mouth daily. (Patient taking differently: Take 7.5 mg by mouth as needed.)   . montelukast (SINGULAIR) 10 MG tablet Take 1 tablet (10 mg total) by mouth daily. (Patient taking differently: Take 10 mg by mouth as needed.)   . SYMBICORT 80-4.5 MCG/ACT inhaler INHALE 2 PUFFS INTO THE LUNGS TWICE  DAILY (Patient taking differently: as needed.)   . triamcinolone cream (KENALOG) 0.1 % Apply 1 application 2 (two) times daily topically.     No facility-administered medications prior to visit.    Review of Systems  Constitutional: Negative.   HENT: Negative.   Eyes: Negative.   Respiratory: Negative.   Cardiovascular: Negative.   Gastrointestinal: Negative.   Endocrine: Negative.   Genitourinary: Negative.   Musculoskeletal: Negative.   Skin: Negative.   Allergic/Immunologic: Negative.   Neurological: Negative.   Hematological: Negative for adenopathy. Bruises/bleeds easily.  Psychiatric/Behavioral: Negative.       Objective    BP 139/85 (BP Location: Right Arm, Patient Position: Sitting, Cuff Size: Large)   Pulse 89   Temp 98.3 F (36.8 C) (Oral)   Ht 5' (1.524 m)   Wt 136 lb 4.8 oz (61.8 kg)   BMI 26.62 kg/m    Physical Exam Vitals reviewed.  Constitutional:      General: She is not in acute distress.    Appearance: Normal appearance. She is well-developed. She is not diaphoretic.  HENT:     Head: Normocephalic and atraumatic.     Right Ear: Tympanic membrane, ear canal and external ear normal.     Left Ear: Tympanic membrane, ear canal and external ear normal.  Eyes:     General: No scleral icterus.    Conjunctiva/sclera: Conjunctivae normal.  Pupils: Pupils are equal, round, and reactive to light.  Neck:     Thyroid: No thyromegaly.  Cardiovascular:     Rate and Rhythm: Normal rate and regular rhythm.     Pulses: Normal pulses.     Heart sounds: Normal heart sounds. No murmur heard.   Pulmonary:     Effort: Pulmonary effort is normal. No respiratory distress.     Breath sounds: Normal breath sounds. No wheezing or rales.  Abdominal:     General: There is no distension.     Palpations: Abdomen is soft.     Tenderness: There is no abdominal tenderness.  Musculoskeletal:        General: No deformity.     Cervical back: Neck supple.     Right  lower leg: No edema.     Left lower leg: No edema.  Lymphadenopathy:     Cervical: No cervical adenopathy.  Skin:    General: Skin is warm and dry.     Findings: No rash.  Neurological:     Mental Status: She is alert and oriented to person, place, and time. Mental status is at baseline.     Sensory: No sensory deficit.     Motor: No weakness.     Gait: Gait normal.  Psychiatric:        Mood and Affect: Mood normal.        Behavior: Behavior normal.        Thought Content: Thought content normal.      No results found for any visits on 05/26/20.  Assessment & Plan     Problem List Items Addressed This Visit      Cardiovascular and Mediastinum   Essential hypertension    Well controlled on recheck Continue current medications Recheck metabolic panel F/u in 6 months       Relevant Orders   Comprehensive metabolic panel     Musculoskeletal and Integument   OP (osteoporosis)    Patient declines repeat DEXA at this time Continue Ca/Vit D supplements and weight bearing exercise        Other   Abnormal LFTs   Relevant Orders   Comprehensive metabolic panel   Blood glucose elevated    Recheck A1c      Relevant Orders   Hemoglobin A1c   HLD (hyperlipidemia)    Previously well controlled Continue statin Repeat FLP and CMP      Relevant Orders   Comprehensive metabolic panel   Lipid panel    Other Visit Diagnoses    Encounter for subsequent annual wellness visit (AWV) in Medicare patient    -  Primary   Encounter for annual physical exam       Relevant Orders   Comprehensive metabolic panel   Lipid panel   Hemoglobin A1c   Cologuard   Colon cancer screening       Relevant Orders   Cologuard       AWV screenings completed. Declines cognitive screening.  Reviewed vaccines, HM, labs, etc  Return in about 6 months (around 11/23/2020) for chronic disease f/u.      I, Lavon Paganini, MD, have reviewed all documentation for this visit. The  documentation on 05/26/20 for the exam, diagnosis, procedures, and orders are all accurate and complete.   [place], Dionne Bucy, MD, MPH [facility]

## 2020-05-26 NOTE — Assessment & Plan Note (Signed)
Well controlled on recheck Continue current medications Recheck metabolic panel F/u in 6 months  

## 2020-05-26 NOTE — Assessment & Plan Note (Signed)
Previously well controlled Continue statin Repeat FLP and CMP  

## 2020-05-26 NOTE — Assessment & Plan Note (Signed)
Recheck A1c 

## 2020-05-26 NOTE — Patient Instructions (Signed)
Preventive Care 76 Years and Older, Female Preventive care refers to lifestyle choices and visits with your health care provider that can promote health and wellness. This includes:  A yearly physical exam. This is also called an annual wellness visit.  Regular dental and eye exams.  Immunizations.  Screening for certain conditions.  Healthy lifestyle choices, such as: ? Eating a healthy diet. ? Getting regular exercise. ? Not using drugs or products that contain nicotine and tobacco. ? Limiting alcohol use. What can I expect for my preventive care visit? Physical exam Your health care provider will check your:  Height and weight. These may be used to calculate your BMI (body mass index). BMI is a measurement that tells if you are at a healthy weight.  Heart rate and blood pressure.  Body temperature.  Skin for abnormal spots. Counseling Your health care provider may ask you questions about your:  Past medical problems.  Family's medical history.  Alcohol, tobacco, and drug use.  Emotional well-being.  Home life and relationship well-being.  Sexual activity.  Diet, exercise, and sleep habits.  History of falls.  Memory and ability to understand (cognition).  Work and work Statistician.  Pregnancy and menstrual history.  Access to firearms. What immunizations do I need? Vaccines are usually given at various ages, according to a schedule. Your health care provider will recommend vaccines for you based on your age, medical history, and lifestyle or other factors, such as travel or where you work.   What tests do I need? Blood tests  Lipid and cholesterol levels. These may be checked every 5 years, or more often depending on your overall health.  Hepatitis C test.  Hepatitis B test. Screening  Lung cancer screening. You may have this screening every year starting at age 76 if you have a 30-pack-year history of smoking and currently smoke or have quit within  the past 15 years.  Colorectal cancer screening. ? All adults should have this screening starting at age 76 and continuing until age 76. ? Your health care provider may recommend screening at age 76 if you are at increased risk. ? You will have tests every 1-10 years, depending on your results and the type of screening test.  Diabetes screening. ? This is done by checking your blood sugar (glucose) after you have not eaten for a while (fasting). ? You may have this done every 1-3 years.  Mammogram. ? This may be done every 1-2 years. ? Talk with your health care provider about how often you should have regular mammograms.  Abdominal aortic aneurysm (AAA) screening. You may need this if you are a current or former smoker.  BRCA-related cancer screening. This may be done if you have a family history of breast, ovarian, tubal, or peritoneal cancers. Other tests  STD (sexually transmitted disease) testing, if you are at risk.  Bone density scan. This is done to screen for osteoporosis. You may have this done starting at age 76. Talk with your health care provider about your test results, treatment options, and if necessary, the need for more tests. Follow these instructions at home: Eating and drinking  Eat a diet that includes fresh fruits and vegetables, whole grains, lean protein, and low-fat dairy products. Limit your intake of foods with high amounts of sugar, saturated fats, and salt.  Take vitamin and mineral supplements as recommended by your health care provider.  Do not drink alcohol if your health care provider tells you not to drink.  If you drink alcohol: ? Limit how much you have to 0-1 drink a day. ? Be aware of how much alcohol is in your drink. In the [place] one drink equals one 12 oz bottle of beer (355 mL), one 5 oz glass of wine (148 mL), or one 1 oz glass of hard liquor (44 mL).   Lifestyle  Take daily care of your teeth and gums. Brush your teeth every morning  and night with fluoride toothpaste. Floss one time each day.  Stay active. Exercise for at least 30 minutes 5 or more days each week.  Do not use any products that contain nicotine or tobacco, such as cigarettes, e-cigarettes, and chewing tobacco. If you need help quitting, ask your health care provider.  Do not use drugs.  If you are sexually active, practice safe sex. Use a condom or other form of protection in order to prevent STIs (sexually transmitted infections).  Talk with your health care provider about taking a low-dose aspirin or statin.  Find healthy ways to cope with stress, such as: ? Meditation, yoga, or listening to music. ? Journaling. ? Talking to a trusted person. ? Spending time with friends and family. Safety  Always wear your seat belt while driving or riding in a vehicle.  Do not drive: ? If you have been drinking alcohol. Do not ride with someone who has been drinking. ? When you are tired or distracted. ? While texting.  Wear a helmet and other protective equipment during sports activities.  If you have firearms in your house, make sure you follow all gun safety procedures. What's next?  Visit your health care provider once a year for an annual wellness visit.  Ask your health care provider how often you should have your eyes and teeth checked.  Stay up to date on all vaccines. This information is not intended to replace advice given to you by your health care provider. Make sure you discuss any questions you have with your health care provider. Document Revised: 04/22/2020 Document Reviewed: 04/26/2018 Elsevier Patient Education  2021 Elsevier Inc.  

## 2020-05-26 NOTE — Assessment & Plan Note (Signed)
Patient declines repeat DEXA at this time Continue Ca/Vit D supplements and weight bearing exercise

## 2020-05-27 ENCOUNTER — Encounter: Payer: Self-pay | Admitting: Family Medicine

## 2020-05-27 LAB — COMPREHENSIVE METABOLIC PANEL
ALT: 34 IU/L — ABNORMAL HIGH (ref 0–32)
AST: 33 IU/L (ref 0–40)
Albumin/Globulin Ratio: 2.1 (ref 1.2–2.2)
Albumin: 4.4 g/dL (ref 3.7–4.7)
Alkaline Phosphatase: 115 IU/L (ref 44–121)
BUN/Creatinine Ratio: 23 (ref 12–28)
BUN: 17 mg/dL (ref 8–27)
Bilirubin Total: 0.5 mg/dL (ref 0.0–1.2)
CO2: 24 mmol/L (ref 20–29)
Calcium: 9.3 mg/dL (ref 8.7–10.3)
Chloride: 101 mmol/L (ref 96–106)
Creatinine, Ser: 0.75 mg/dL (ref 0.57–1.00)
GFR calc Af Amer: 90 mL/min/{1.73_m2} (ref >59)
GFR calc non Af Amer: 78 mL/min/{1.73_m2} (ref >59)
Globulin, Total: 2.1 g/dL (ref 1.5–4.5)
Glucose: 97 mg/dL (ref 65–99)
Potassium: 4.4 mmol/L (ref 3.5–5.2)
Sodium: 139 mmol/L (ref 134–144)
Total Protein: 6.5 g/dL (ref 6.0–8.5)

## 2020-05-27 LAB — HEMOGLOBIN A1C
Est. average glucose Bld gHb Est-mCnc: 114 mg/dL
Hgb A1c MFr Bld: 5.6 % (ref 4.8–5.6)

## 2020-05-27 LAB — LIPID PANEL
Chol/HDL Ratio: 1.9 ratio (ref 0.0–4.4)
Cholesterol, Total: 157 mg/dL (ref 100–199)
HDL: 84 mg/dL (ref >39)
LDL Chol Calc (NIH): 62 mg/dL (ref 0–99)
Triglycerides: 54 mg/dL (ref 0–149)
VLDL Cholesterol Cal: 11 mg/dL (ref 5–40)

## 2020-05-27 NOTE — Telephone Encounter (Signed)
Fyi...  (I did update her med list.)  Thanks,   -Mickel Baas

## 2020-06-15 ENCOUNTER — Encounter: Payer: Self-pay | Admitting: Family Medicine

## 2020-06-15 MED ORDER — ATORVASTATIN CALCIUM 20 MG PO TABS: 20.0000 mg | ORAL_TABLET | Freq: Every day | ORAL | 1 refills | Status: DC

## 2020-06-16 NOTE — Telephone Encounter (Signed)
Ok to refill. Also, can you check on the cologuard?

## 2020-06-16 NOTE — Telephone Encounter (Signed)
Sent cologuard order.

## 2020-06-24 DIAGNOSIS — Z1211 Encounter for screening for malignant neoplasm of colon: Secondary | ICD-10-CM | POA: Diagnosis not present

## 2020-06-25 LAB — COLOGUARD: Cologuard: NEGATIVE

## 2020-07-02 LAB — COLOGUARD: COLOGUARD: NEGATIVE

## 2020-07-09 DIAGNOSIS — H2513 Age-related nuclear cataract, bilateral: Secondary | ICD-10-CM | POA: Diagnosis not present

## 2020-07-16 ENCOUNTER — Other Ambulatory Visit: Payer: Self-pay | Admitting: Family Medicine

## 2020-07-16 ENCOUNTER — Telehealth: Payer: Self-pay

## 2020-07-16 MED ORDER — LISINOPRIL 20 MG PO TABS: 20.0000 mg | ORAL_TABLET | Freq: Every day | ORAL | 1 refills | Status: DC

## 2020-07-16 NOTE — Telephone Encounter (Signed)
Please advise 

## 2020-07-16 NOTE — Telephone Encounter (Signed)
Can we see her to evaluate and determine need for XRay?

## 2020-07-16 NOTE — Telephone Encounter (Signed)
Copied from [place] [id number]. Topic: General - Other >> Jul 16, 2020 12:40 PM Tessa Lerner A wrote: Reason for CRM: Patient is requesting orders for an Xray Patient hurt their left wrist 07/08/20 playing pickle ball, wrist has been iced and elevated with no swelling detected Please contact to advise

## 2020-07-16 NOTE — Telephone Encounter (Signed)
Copied from [place] [id number]. Topic: Quick Communication - Rx Refill/Question >> Jul 16, 2020 12:38 PM Tessa Lerner A wrote: Medication: lisinopril (ZESTRIL) 20 MG tablet   Has the patient contacted their pharmacy? No. Patient was on the phone with agent about other matter and inquired if it would be possible to request a prescription. Agent encouraged patient to reach out to pharmacy first for future refill requests.   Preferred Pharmacy (with phone number or [facility] name): [facility], [place] Phone: [phone]  Agent: Please be advised that RX refills may take up to 3 business days. We ask that you follow-up with your pharmacy.

## 2020-07-17 ENCOUNTER — Encounter: Payer: Self-pay | Admitting: Physician Assistant

## 2020-07-17 ENCOUNTER — Ambulatory Visit
Admission: RE | Admit: 2020-07-17 | Discharge: 2020-07-17 | Disposition: A | Discharge status: Home / Self Care | Payer: PPO | Source: Ambulatory Visit | Attending: Physician Assistant | Admitting: Physician Assistant

## 2020-07-17 ENCOUNTER — Other Ambulatory Visit: Payer: Self-pay

## 2020-07-17 ENCOUNTER — Ambulatory Visit (INDEPENDENT_AMBULATORY_CARE_PROVIDER_SITE_OTHER): Payer: PPO | Admitting: Physician Assistant

## 2020-07-17 ENCOUNTER — Ambulatory Visit
Admission: RE | Admit: 2020-07-17 | Discharge: 2020-07-17 | Disposition: A | Discharge status: Home / Self Care | Payer: PPO | Attending: Physician Assistant | Admitting: Physician Assistant

## 2020-07-17 VITALS — BP 148/83 | HR 80 | Temp 98.6°F | Wt 138.9 lb

## 2020-07-17 DIAGNOSIS — M25532 Pain in left wrist: Secondary | ICD-10-CM | POA: Diagnosis not present

## 2020-07-17 DIAGNOSIS — M79642 Pain in left hand: Secondary | ICD-10-CM | POA: Diagnosis not present

## 2020-07-17 NOTE — Progress Notes (Signed)
Established patient visit   Patient: Cheryl Hays   DOB: 09-May-1946   75 y.o. Female  MRN: [id number] Visit Date: 07/17/2020  Today's healthcare provider: Trinna Post, PA-C   Chief Complaint  Patient presents with  . Wrist Pain  I,Momin Misko M Deanna Boehlke,acting as a scribe for Trinna Post, PA-C.,have documented all relevant documentation on the behalf of Trinna Post, PA-C,as directed by  Trinna Post, PA-C while in the presence of Trinna Post, PA-C.  Subjective    Wrist Pain  The pain is present in the left wrist. This is a new problem. The current episode started 1 to 4 weeks ago. The problem occurs intermittently. The problem has been unchanged. The quality of the pain is described as burning. The pain is at a severity of 4/10. The pain is mild. Associated symptoms include an inability to bear weight and stiffness. Pertinent negatives include no limited range of motion, numbness or tingling. The symptoms are aggravated by activity. She has tried cold, heat, movement and NSAIDS for the symptoms. The treatment provided mild relief.    She fell onto an outstretched left wrist 9 days ago while she was playing pickle ball.      Medications: Outpatient Medications Prior to Visit  Medication Sig  . atorvastatin (LIPITOR) 20 MG tablet Take 1 tablet (20 mg total) by mouth daily.  . Calcium Carb-Cholecalciferol 600-800 MG-UNIT TABS Take 1 tablet by mouth daily. 1200-800  . cetirizine (ZYRTEC) 10 MG tablet Take 1 tablet (10 mg total) by mouth daily.  . fluticasone (FLONASE) 50 MCG/ACT nasal spray Place 2 sprays into both nostrils daily.  . hydrOXYzine (ATARAX/VISTARIL) 10 MG tablet Take 1 tablet (10 mg total) by mouth every 8 (eight) hours as needed for itching.  Marland Kitchen lisinopril (ZESTRIL) 20 MG tablet Take 1 tablet (20 mg total) by mouth daily.  . meloxicam (MOBIC) 7.5 MG tablet Take 1 tablet (7.5 mg total) by mouth daily. (Patient taking differently: Take 7.5 mg by  mouth as needed.)  . montelukast (SINGULAIR) 10 MG tablet Take 1 tablet (10 mg total) by mouth daily. (Patient taking differently: Take 10 mg by mouth as needed.)  . Nutritional Supplements (VITAL HIGH PROTEIN PO) Take by mouth.  . SYMBICORT 80-4.5 MCG/ACT inhaler INHALE 2 PUFFS INTO THE LUNGS TWICE DAILY (Patient taking differently: as needed.)  . triamcinolone cream (KENALOG) 0.1 % Apply 1 application 2 (two) times daily topically.    No facility-administered medications prior to visit.    Review of Systems  Musculoskeletal: Positive for stiffness.  Neurological: Negative for tingling and numbness.       Objective    BP (!) 148/83 (BP Location: Right Arm, Patient Position: Sitting, Cuff Size: Normal)   Pulse 80   Temp 98.6 F (37 C) (Oral)   Wt 138 lb 14.4 oz (63 kg)   SpO2 100%   BMI 27.13 kg/m     Physical Exam Constitutional:      Appearance: Normal appearance.  Musculoskeletal:     Right wrist: Normal.     Left wrist: Tenderness present. No swelling or deformity. Decreased range of motion.  Skin:    General: Skin is warm and dry.  Neurological:     Mental Status: She is alert.  Psychiatric:        Mood and Affect: Mood normal.        Behavior: Behavior normal.       No results found for any visits on  07/17/20.  Assessment & Plan    1. Left wrist pain  No fractures on xrays, tylenol and ibuprofen as tolerated.   - DG Wrist Complete Left; Future - DG Hand Complete Left; Future   Return if symptoms worsen or fail to improve.      ITrinna Post, PA-C, have reviewed all documentation for this visit. The documentation on 07/20/20 for the exam, diagnosis, procedures, and orders are all accurate and complete.  The entirety of the information documented in the History of Present Illness, Review of Systems and Physical Exam were personally obtained by me. Portions of this information were initially documented by [facility] and reviewed by me for  thoroughness and accuracy.     Paulene Floor  [facility] [phone] (phone) [phone] (fax)  [place]

## 2020-07-17 NOTE — Patient Instructions (Signed)
Wrist Pain, Adult There are many things that can cause wrist pain. Some common causes include:  An injury to the wrist.  Using the joint too much.  A condition that causes too much pressure to be put on a nerve in the wrist (carpal tunnel syndrome).  Wear and tear of the joints that happens as a person gets older (osteoarthritis).  A condition that causes swelling and stiffness in the joints (arthritis). Sometimes, the cause of wrist pain is not known. Often, the pain goes away when you follow your doctor's instructions for easing pain at home. This may include resting your wrist, icing your wrist, or using a splint or an elastic wrap for a short time. It is important to tell your doctor if your wrist pain does not go away. Follow these instructions at home: If you have a splint or elastic wrap:  Wear the splint or wrap as told by your doctor. Take it off only as told by your doctor. Ask if you can take it off for bathing.  Loosen the splint or wrap if your fingers: ? Tingle. ? Become numb. ? Turn cold and blue.  Check the skin around the splint or wrap every day. Tell your doctor about any concerns.  Keep the splint or wrap clean.  If the splint or wrap is not waterproof: ? Do not let it get wet. ? Cover it with a watertight covering when you take a bath or shower. Managing pain, stiffness, and swelling  If told, put ice on the painful area. To do this: ? If you have a removable splint or wrap, take it off as told by your doctor. ? Put ice in a plastic bag. ? Place a towel between your skin and the bag or between your splint or wrap and the bag. ? Leave the ice on for 20 minutes, 2-3 times a day.  Move your fingers often.  Raise (elevate) the injured area above the level of your heart while you are sitting or lying down.   Activity  Rest your wrist as told by your doctor.  Return to your normal activities as told by your doctor. Ask your doctor what activities are safe  for you.  Ask your doctor when it is safe to drive if you have a splint or wrap on your wrist.  Do exercises as told by your doctor. General instructions  Pay attention to any changes in your symptoms.  Take over-the-counter and prescription medicines only as told by your doctor.  Keep all follow-up visits as told by your doctor. This is important. Contact a doctor if:  You have a sudden, sharp pain in the wrist, hand, or arm that is different or new.  The swelling or bruising on your wrist or hand gets worse.  Your skin: ? Becomes red. ? Gets a rash. ? Has open sores.  Your pain does not get better.  Your pain gets worse.  You have a fever or chills. Get help right away if:  You lose feeling in your fingers or hand.  Your fingers turn white, very red, or cold and blue.  You cannot move your fingers. Summary  There are many things that can cause wrist pain.  It is important to tell your doctor if your wrist pain does not go away.  You may need to wear a splint or a wrap for a short period of time.  Return to your normal activities as told by your doctor. Ask your doctor   what activities are safe for you. This information is not intended to replace advice given to you by your health care provider. Make sure you discuss any questions you have with your health care provider. Document Revised: 03/21/2019 Document Reviewed: 03/21/2019 Elsevier Patient Education  2021 Elsevier Inc.  

## 2020-07-20 ENCOUNTER — Encounter: Payer: Self-pay | Admitting: Physician Assistant

## 2020-08-07 DIAGNOSIS — L209 Atopic dermatitis, unspecified: Secondary | ICD-10-CM | POA: Diagnosis not present

## 2020-08-07 DIAGNOSIS — D229 Melanocytic nevi, unspecified: Secondary | ICD-10-CM | POA: Diagnosis not present

## 2020-08-07 DIAGNOSIS — L820 Inflamed seborrheic keratosis: Secondary | ICD-10-CM | POA: Diagnosis not present

## 2020-08-07 DIAGNOSIS — L821 Other seborrheic keratosis: Secondary | ICD-10-CM | POA: Diagnosis not present

## 2020-08-07 DIAGNOSIS — L814 Other melanin hyperpigmentation: Secondary | ICD-10-CM | POA: Diagnosis not present

## 2020-08-07 DIAGNOSIS — Z85828 Personal history of other malignant neoplasm of skin: Secondary | ICD-10-CM | POA: Diagnosis not present

## 2020-08-07 DIAGNOSIS — L299 Pruritus, unspecified: Secondary | ICD-10-CM | POA: Diagnosis not present

## 2020-08-07 DIAGNOSIS — L578 Other skin changes due to chronic exposure to nonionizing radiation: Secondary | ICD-10-CM | POA: Diagnosis not present

## 2020-11-30 ENCOUNTER — Ambulatory Visit: Payer: Self-pay | Admitting: Family Medicine

## 2021-01-15 ENCOUNTER — Ambulatory Visit (INDEPENDENT_AMBULATORY_CARE_PROVIDER_SITE_OTHER): Payer: PPO | Admitting: Family Medicine

## 2021-01-15 ENCOUNTER — Encounter: Payer: Self-pay | Admitting: Family Medicine

## 2021-01-15 ENCOUNTER — Other Ambulatory Visit: Payer: Self-pay

## 2021-01-15 VITALS — BP 130/70 | HR 76 | Temp 97.8°F | Wt 136.0 lb

## 2021-01-15 DIAGNOSIS — R7989 Other specified abnormal findings of blood chemistry: Secondary | ICD-10-CM

## 2021-01-15 DIAGNOSIS — R945 Abnormal results of liver function studies: Secondary | ICD-10-CM | POA: Diagnosis not present

## 2021-01-15 DIAGNOSIS — Z23 Encounter for immunization: Secondary | ICD-10-CM

## 2021-01-15 DIAGNOSIS — E7800 Pure hypercholesterolemia, unspecified: Secondary | ICD-10-CM

## 2021-01-15 DIAGNOSIS — R7303 Prediabetes: Secondary | ICD-10-CM | POA: Insufficient documentation

## 2021-01-15 DIAGNOSIS — R1319 Other dysphagia: Secondary | ICD-10-CM

## 2021-01-15 DIAGNOSIS — I10 Essential (primary) hypertension: Secondary | ICD-10-CM | POA: Diagnosis not present

## 2021-01-15 DIAGNOSIS — R739 Hyperglycemia, unspecified: Secondary | ICD-10-CM

## 2021-01-15 MED ORDER — LISINOPRIL 20 MG PO TABS: 20.0000 mg | ORAL_TABLET | Freq: Every day | ORAL | 1 refills | Status: DC

## 2021-01-15 MED ORDER — ATORVASTATIN CALCIUM 20 MG PO TABS: 20.0000 mg | ORAL_TABLET | ORAL | 1 refills | Status: DC

## 2021-01-15 NOTE — Assessment & Plan Note (Signed)
New problem Only with solids Ref to GI for possible EGD

## 2021-01-15 NOTE — Assessment & Plan Note (Signed)
Previously well controlled Continue statin Repeat FLP and CMP at next visit 

## 2021-01-15 NOTE — Progress Notes (Signed)
Established patient visit   Patient: Cheryl Hays   DOB: Oct 12, 1945   75 y.o. Female  MRN: [id number] Visit Date: 01/15/2021  Today's healthcare provider: Lavon Paganini, MD   Chief Complaint  Patient presents with   Hypertension   Hyperglycemia   Subjective  -------------------------------------------------------------------------------------------------------------------- Hypertension Pertinent negatives include no chest pain, headaches, neck pain, palpitations or shortness of breath.  Hyperglycemia Pertinent negatives include no abdominal pain, chest pain, chills, coughing, fatigue, fever, headaches, myalgias, nausea, neck pain, numbness, sore throat, vomiting or weakness.   Hypertension, follow-up  BP Readings from Last 3 Encounters:  01/15/21 130/70  07/17/20 (!) 148/83  05/26/20 139/85   Wt Readings from Last 3 Encounters:  01/15/21 136 lb (61.7 kg)  07/17/20 138 lb 14.4 oz (63 kg)  05/26/20 136 lb 4.8 oz (61.8 kg)     She was last seen for hypertension 8 months ago.  BP at that visit was 139/85. Management since that visit includes no changes.  Tolerating lisinopril 20 mg.  Refill lisinopril.  She reports excellent compliance with treatment. She is not having side effects.  She is exercising. She is not adherent to low salt diet.    Outside blood pressures are stable. Ranges mid 120s-130s/70s.   She does not smoke.  Use of agents associated with hypertension: none.   --------------------------------------------------------------------------------------------------- Lipid/Cholesterol, follow-up  Last Lipid Panel: Lab Results  Component Value Date   CHOL 157 05/26/2020   LDLCALC 62 05/26/2020   HDL 84 05/26/2020   TRIG 54 05/26/2020    She was last seen for this 8 months ago.  Management since that visit includes no changes.  Tolerating lipitor 20 mg.  She reports excellent compliance with treatment. She is not having side  effects.   Symptoms: No appetite changes No foot ulcerations  No chest pain No chest pressure/discomfort  No dyspnea No orthopnea  No fatigue No lower extremity edema  No palpitations No paroxysmal nocturnal dyspnea  No nausea No numbness or tingling of extremity  No polydipsia No polyuria  No speech difficulty No syncope   She is following a Regular diet. Current exercise: aerobics, swimming, and walking  Last metabolic panel Lab Results  Component Value Date   GLUCOSE 97 05/26/2020   NA 139 05/26/2020   K 4.4 05/26/2020   BUN 17 05/26/2020   CREATININE 0.75 05/26/2020   GFRNONAA 78 05/26/2020   GFRAA 90 05/26/2020   CALCIUM 9.3 05/26/2020   AST 33 05/26/2020   ALT 34 (H) 05/26/2020   The 10-year ASCVD risk score Mikey Bussing DC Jr., et al., 2013) is: 24.2%  Difficulty swallowing  She recently noticed that when she eats sandwich's its more difficult for her to swallow. She is requesting for referral to GI.    Flu Vaccine 01/15/21 ---------------------------------------------------------------------------------------------------     Medications: Outpatient Medications Prior to Visit  Medication Sig   Calcium Carb-Cholecalciferol 600-800 MG-UNIT TABS Take 1 tablet by mouth daily. 1200-800   cetirizine (ZYRTEC) 10 MG tablet Take 1 tablet (10 mg total) by mouth daily. (Patient taking differently: Take 10 mg by mouth as needed.)   fluticasone (FLONASE) 50 MCG/ACT nasal spray Place 2 sprays into both nostrils daily. (Patient taking differently: Place 2 sprays into both nostrils as needed.)   meloxicam (MOBIC) 7.5 MG tablet Take 1 tablet (7.5 mg total) by mouth daily. (Patient taking differently: Take 7.5 mg by mouth as needed.)   montelukast (SINGULAIR) 10 MG tablet Take 1 tablet (10 mg total) by  mouth daily. (Patient taking differently: Take 10 mg by mouth as needed.)   Nutritional Supplements (VITAL HIGH PROTEIN PO) Take by mouth.   [DISCONTINUED] atorvastatin (LIPITOR) 20 MG  tablet Take 1 tablet (20 mg total) by mouth daily. (Patient taking differently: Take 20 mg by mouth every other day.)   [DISCONTINUED] lisinopril (ZESTRIL) 20 MG tablet Take 1 tablet (20 mg total) by mouth daily.   [DISCONTINUED] hydrOXYzine (ATARAX/VISTARIL) 10 MG tablet Take 1 tablet (10 mg total) by mouth every 8 (eight) hours as needed for itching. (Patient not taking: Reported on 01/15/2021)   [DISCONTINUED] SYMBICORT 80-4.5 MCG/ACT inhaler INHALE 2 PUFFS INTO THE LUNGS TWICE DAILY (Patient not taking: Reported on 01/15/2021)   [DISCONTINUED] triamcinolone cream (KENALOG) 0.1 % Apply 1 application 2 (two) times daily topically.    No facility-administered medications prior to visit.    Review of Systems  Constitutional:  Negative for activity change, appetite change, chills, fatigue, fever and unexpected weight change.  HENT:  Negative for ear pain, sinus pressure, sinus pain and sore throat.   Eyes:  Negative for pain and visual disturbance.  Respiratory:  Negative for cough, chest tightness, shortness of breath and wheezing.   Cardiovascular:  Negative for chest pain, palpitations and leg swelling.  Gastrointestinal:  Negative for abdominal pain, blood in stool, diarrhea, nausea and vomiting.  Genitourinary:  Negative for dysuria, flank pain, frequency, pelvic pain and urgency.  Musculoskeletal:  Negative for back pain, myalgias and neck pain.  Neurological:  Negative for dizziness, weakness, light-headedness, numbness and headaches.       Objective  -------------------------------------------------------------------------------------------------------------------- BP 130/70 Comment: home readings  Pulse 76   Temp 97.8 F (36.6 C) (Oral)   Wt 136 lb (61.7 kg)   HC 16" (40.6 cm)   BMI 26.56 kg/m  BP Readings from Last 3 Encounters:  01/15/21 130/70  07/17/20 (!) 148/83  05/26/20 139/85   Wt Readings from Last 3 Encounters:  01/15/21 136 lb (61.7 kg)  07/17/20 138 lb 14.4 oz  (63 kg)  05/26/20 136 lb 4.8 oz (61.8 kg)      Physical Exam Vitals reviewed.  Constitutional:      General: She is not in acute distress.    Appearance: Normal appearance. She is well-developed. She is not diaphoretic.  HENT:     Head: Normocephalic and atraumatic.  Eyes:     General: No scleral icterus.    Conjunctiva/sclera: Conjunctivae normal.  Neck:     Thyroid: No thyromegaly.  Cardiovascular:     Rate and Rhythm: Normal rate and regular rhythm.     Pulses: Normal pulses.     Heart sounds: Normal heart sounds. No murmur heard. Pulmonary:     Effort: Pulmonary effort is normal. No respiratory distress.     Breath sounds: Normal breath sounds. No wheezing, rhonchi or rales.  Musculoskeletal:     Cervical back: Neck supple.     Right lower leg: No edema.     Left lower leg: No edema.  Lymphadenopathy:     Cervical: No cervical adenopathy.  Skin:    General: Skin is warm and dry.     Findings: No rash.  Neurological:     Mental Status: She is alert and oriented to person, place, and time. Mental status is at baseline.  Psychiatric:        Mood and Affect: Mood normal.        Behavior: Behavior normal.      No results found for any  visits on 01/15/21.  Assessment & Plan  ---------------------------------------------------------------------------------------------------------------------- Problem List Items Addressed This Visit       Cardiovascular and Mediastinum   Essential hypertension    Well controlled on home readigns Continue current medications Recheck metabolic panel F/u in 6 months       Relevant Medications   atorvastatin (LIPITOR) 20 MG tablet   lisinopril (ZESTRIL) 20 MG tablet   Other Relevant Orders   Comprehensive metabolic panel     Digestive   Esophageal dysphagia    New problem Only with solids Ref to GI for possible EGD      Relevant Orders   Ambulatory referral to Gastroenterology     Other   Abnormal LFTs    Repeat CMP       Relevant Orders   Comprehensive metabolic panel   Blood glucose elevated    Recommend low carb diet Recheck A1c       Relevant Orders   Hemoglobin A1c   HLD (hyperlipidemia) - Primary    Previously well controlled Continue statin Repeat FLP and CMP at next visit      Relevant Medications   atorvastatin (LIPITOR) 20 MG tablet   lisinopril (ZESTRIL) 20 MG tablet   Other Relevant Orders   Comprehensive metabolic panel   Other Visit Diagnoses     Need for influenza vaccination       Relevant Orders   Flu Vaccine QUAD High Dose(Fluad) (Completed)        Return in about 4 months (around 05/17/2021) for AWV, CPE, as scheduled.      I,Essence Turner,acting as a Education administrator for Lavon Paganini, MD.,have documented all relevant documentation on the behalf of Lavon Paganini, MD,as directed by  Lavon Paganini, MD while in the presence of Lavon Paganini, MD.  I, Lavon Paganini, MD, have reviewed all documentation for this visit. The documentation on 01/15/21 for the exam, diagnosis, procedures, and orders are all accurate and complete.   Jeryn Cerney, Dionne Bucy, MD, MPH [facility]

## 2021-01-15 NOTE — Assessment & Plan Note (Signed)
Repeat CMP

## 2021-01-15 NOTE — Assessment & Plan Note (Signed)
Well controlled on home readigns Continue current medications Recheck metabolic panel F/u in 6 months

## 2021-01-15 NOTE — Assessment & Plan Note (Signed)
Recommend low carb diet °Recheck A1c  °

## 2021-01-16 LAB — COMPREHENSIVE METABOLIC PANEL
ALT: 22 IU/L (ref 0–32)
AST: 25 IU/L (ref 0–40)
Albumin/Globulin Ratio: 1.9 (ref 1.2–2.2)
Albumin: 4.4 g/dL (ref 3.7–4.7)
Alkaline Phosphatase: 88 IU/L (ref 44–121)
BUN/Creatinine Ratio: 26 (ref 12–28)
BUN: 18 mg/dL (ref 8–27)
Bilirubin Total: 0.7 mg/dL (ref 0.0–1.2)
CO2: 25 mmol/L (ref 20–29)
Calcium: 9.4 mg/dL (ref 8.7–10.3)
Chloride: 101 mmol/L (ref 96–106)
Creatinine, Ser: 0.7 mg/dL (ref 0.57–1.00)
Globulin, Total: 2.3 g/dL (ref 1.5–4.5)
Glucose: 90 mg/dL (ref 65–99)
Potassium: 4.6 mmol/L (ref 3.5–5.2)
Sodium: 140 mmol/L (ref 134–144)
Total Protein: 6.7 g/dL (ref 6.0–8.5)
eGFR: 90 mL/min/{1.73_m2} (ref >59)

## 2021-01-16 LAB — HEMOGLOBIN A1C
Est. average glucose Bld gHb Est-mCnc: 117 mg/dL
Hgb A1c MFr Bld: 5.7 % — ABNORMAL HIGH (ref 4.8–5.6)

## 2021-01-19 ENCOUNTER — Encounter: Payer: Self-pay | Admitting: *Deleted

## 2021-03-11 DIAGNOSIS — L570 Actinic keratosis: Secondary | ICD-10-CM | POA: Diagnosis not present

## 2021-03-11 DIAGNOSIS — L814 Other melanin hyperpigmentation: Secondary | ICD-10-CM | POA: Diagnosis not present

## 2021-03-11 DIAGNOSIS — L578 Other skin changes due to chronic exposure to nonionizing radiation: Secondary | ICD-10-CM | POA: Diagnosis not present

## 2021-03-11 DIAGNOSIS — L299 Pruritus, unspecified: Secondary | ICD-10-CM | POA: Diagnosis not present

## 2021-03-11 DIAGNOSIS — Z85828 Personal history of other malignant neoplasm of skin: Secondary | ICD-10-CM | POA: Diagnosis not present

## 2021-03-11 DIAGNOSIS — L821 Other seborrheic keratosis: Secondary | ICD-10-CM | POA: Diagnosis not present

## 2021-03-11 DIAGNOSIS — L853 Xerosis cutis: Secondary | ICD-10-CM | POA: Diagnosis not present

## 2021-03-17 ENCOUNTER — Other Ambulatory Visit: Payer: Self-pay | Admitting: Family Medicine

## 2021-03-17 DIAGNOSIS — Z1231 Encounter for screening mammogram for malignant neoplasm of breast: Secondary | ICD-10-CM

## 2021-03-18 ENCOUNTER — Other Ambulatory Visit: Payer: Self-pay

## 2021-03-18 ENCOUNTER — Encounter: Payer: Self-pay | Admitting: Gastroenterology

## 2021-03-18 ENCOUNTER — Ambulatory Visit: Payer: PPO | Admitting: Gastroenterology

## 2021-03-18 VITALS — BP 140/85 | HR 114 | Temp 98.2°F | Ht 60.0 in | Wt 134.0 lb

## 2021-03-18 DIAGNOSIS — R1319 Other dysphagia: Secondary | ICD-10-CM

## 2021-03-18 DIAGNOSIS — Z1211 Encounter for screening for malignant neoplasm of colon: Secondary | ICD-10-CM | POA: Diagnosis not present

## 2021-03-18 NOTE — Progress Notes (Signed)
Cephas Darby, MD [address]  [place]  [place], [place] [address]  Main: [phone]  Fax: [phone]    Gastroenterology Consultation  Referring Provider:     Virginia Crews, MD Primary Care Physician:  Virginia Crews, MD Primary Gastroenterologist:  Dr. Cephas Darby Reason for Consultation:     Dysphagia        HPI:   Cheryl Hays is a 75 y.o. female referred by Dr. Brita Romp, Dionne Bucy, MD  for consultation & management of dysphagia.  Patient reports that for approximately 1 year, she has been experiencing difficulty swallowing bread.  She tries to avoid bread.  She does report intermittent heartburn.  She does not eat hard meats.  Patient denies any weight loss.  She does not think her symptoms are progressive.  Her labs are unremarkable Patient does not smoke or drink alcohol NSAIDs: None  Antiplts/Anticoagulants/Anti thrombotics: None  GI Procedures: Colonoscopy 2013 revealed diverticulosis, recommended to undergo repeat colonoscopy in 5 years.  Patient had Cologuard test in 2022 negative  Past Medical History:  Diagnosis Date   Asthma    Dyspnea    with colds   GERD (gastroesophageal reflux disease)    Hyperlipidemia    Pneumonia     Past Surgical History:  Procedure Laterality Date   BREAST SURGERY Left 2000   biopsy   CESAREAN SECTION  05/08/1978, 07/03/1979   DILATION AND CURETTAGE OF UTERUS  1993   FACIAL COSMETIC SURGERY  2006   SKIN SURGERY Right    hand 02/2016 x's 2 Dr. Nehemiah Massed    Current Outpatient Medications:    atorvastatin (LIPITOR) 20 MG tablet, Take 1 tablet (20 mg total) by mouth every other day., Disp: 45 tablet, Rfl: 1   Calcium Carb-Cholecalciferol 600-800 MG-UNIT TABS, Take 1 tablet by mouth daily. 1200-800, Disp: , Rfl:    cetirizine (ZYRTEC) 10 MG tablet, Take 1 tablet (10 mg total) by mouth daily. (Patient taking differently: Take 10 mg by mouth as needed.), Disp: 90 tablet, Rfl: 3   fluticasone  (FLONASE) 50 MCG/ACT nasal spray, Place 2 sprays into both nostrils daily. (Patient taking differently: Place 2 sprays into both nostrils as needed.), Disp: 16 g, Rfl: 11   lisinopril (ZESTRIL) 20 MG tablet, Take 1 tablet (20 mg total) by mouth daily., Disp: 90 tablet, Rfl: 1   meloxicam (MOBIC) 7.5 MG tablet, Take 1 tablet (7.5 mg total) by mouth daily. (Patient taking differently: Take 7.5 mg by mouth as needed.), Disp: 90 tablet, Rfl: 1   montelukast (SINGULAIR) 10 MG tablet, Take 1 tablet (10 mg total) by mouth daily. (Patient taking differently: Take 10 mg by mouth as needed.), Disp: 90 tablet, Rfl: 3   Nutritional Supplements (VITAL HIGH PROTEIN PO), Take by mouth., Disp: , Rfl:     Family History  Problem Relation Age of Onset   Heart murmur Mother    Vision loss Father    Bladder Cancer Brother      Social History   Tobacco Use   Smoking status: Former    Types: Cigarettes    Quit date: 05/16/1975    Years since quitting: 45.8   Smokeless tobacco: Never  Vaping Use   Vaping Use: Never used  Substance Use Topics   Alcohol use: Yes    Comment: a couple drink monthly   Drug use: No    Allergies as of 03/18/2021 - Review Complete 03/18/2021  Allergen Reaction Noted   Celecoxib  10/31/2014  Levofloxacin Other (See Comments) 03/23/2016   Sulfa antibiotics  10/31/2014   Pseudoephedrine Rash 10/31/2014    Review of Systems:    All systems reviewed and negative except where noted in HPI.   Physical Exam:  BP 140/85 (BP Location: Left Arm, Patient Position: Sitting, Cuff Size: Normal)   Pulse (!) 114   Temp 98.2 F (36.8 C) (Oral)   Ht 5' (1.524 m)   Wt 134 lb (60.8 kg)   BMI 26.17 kg/m  No LMP recorded. Patient is postmenopausal.  General:   Alert,  Well-developed, well-nourished, pleasant and cooperative in NAD Head:  Normocephalic and atraumatic. Eyes:  Sclera clear, no icterus.   Conjunctiva pink. Ears:  Normal auditory acuity. Nose:  No deformity,  discharge, or lesions. Mouth:  No deformity or lesions,oropharynx pink & moist. Neck:  Supple; no masses or thyromegaly. Lungs:  Respirations even and unlabored.  Clear throughout to auscultation.   No wheezes, crackles, or rhonchi. No acute distress. Heart:  Regular rate and rhythm; no murmurs, clicks, rubs, or gallops. Abdomen:  Normal bowel sounds. Soft, non-tender and non-distended without masses, hepatosplenomegaly or hernias noted.  No guarding or rebound tenderness.   Rectal: Not performed Msk:  Symmetrical without gross deformities. Good, equal movement & strength bilaterally. Pulses:  Normal pulses noted. Extremities:  No clubbing or edema.  No cyanosis. Neurologic:  Alert and oriented x3;  grossly normal neurologically. Skin:  Intact without significant lesions or rashes. No jaundice. Psych:  Alert and cooperative. Normal mood and affect.  Imaging Studies: No abdominal imaging  Assessment and Plan:   Cheryl Hays is a 75 y.o. Caucasian female with no significant past medical history is seen in consultation for 1 year history of intermittent dysphagia to certain type of solids, particularly bread.  Patient is functionally independent  Chronic intermittent dysphagia to solids Recommend EGD for further evaluation and empiric dilation if EGD is unremarkable  Colon cancer screening Recommend screening colonoscopy, patient prefers [place]  I have discussed alternative options, risks & benefits,  which include, but are not limited to, bleeding, infection, perforation,respiratory complication & drug reaction.  The patient agrees with this plan & written consent will be obtained.     Follow up based on the above work-up   Cephas Darby, MD

## 2021-03-19 ENCOUNTER — Encounter: Payer: Self-pay | Admitting: Gastroenterology

## 2021-03-19 ENCOUNTER — Encounter: Payer: Self-pay | Admitting: Family Medicine

## 2021-03-19 NOTE — Telephone Encounter (Signed)
Patient advised she had cologuard done on 06/24/20. Patient will contact GI regarding appointment.

## 2021-03-22 ENCOUNTER — Other Ambulatory Visit: Payer: Self-pay

## 2021-03-22 ENCOUNTER — Telehealth: Payer: Self-pay

## 2021-03-22 NOTE — Telephone Encounter (Signed)
Patient called back about colonoscopy and right  now she will call her insurance and find out if they will cover her screening colonoscopy if not she will call us back she doesn't want colonoscopy done really anyways

## 2021-03-22 NOTE — Progress Notes (Signed)
Patient called back her insurance will not cover her colonoscopy for three years because she just had a cologuard test she will schedule a colonoscopy then called endo and had them cancel her colonoscopy but keep egd part and also sent the instructions on her egd through my chart

## 2021-04-15 ENCOUNTER — Encounter
Admission: RE | Disposition: A | Discharge status: Home / Self Care | Payer: Self-pay | Source: Home / Self Care | Attending: Gastroenterology

## 2021-04-15 ENCOUNTER — Ambulatory Visit
Admission: RE | Admit: 2021-04-15 | Discharge: 2021-04-15 | Disposition: A | Discharge status: Home / Self Care | Payer: PPO | Attending: Gastroenterology | Admitting: Gastroenterology

## 2021-04-15 ENCOUNTER — Ambulatory Visit: Payer: PPO | Admitting: Anesthesiology

## 2021-04-15 ENCOUNTER — Encounter: Payer: Self-pay | Admitting: Gastroenterology

## 2021-04-15 DIAGNOSIS — J45909 Unspecified asthma, uncomplicated: Secondary | ICD-10-CM | POA: Insufficient documentation

## 2021-04-15 DIAGNOSIS — K449 Diaphragmatic hernia without obstruction or gangrene: Secondary | ICD-10-CM | POA: Diagnosis not present

## 2021-04-15 DIAGNOSIS — K2210 Ulcer of esophagus without bleeding: Secondary | ICD-10-CM | POA: Diagnosis not present

## 2021-04-15 DIAGNOSIS — K2100 Gastro-esophageal reflux disease with esophagitis, without bleeding: Secondary | ICD-10-CM | POA: Diagnosis not present

## 2021-04-15 DIAGNOSIS — Z1211 Encounter for screening for malignant neoplasm of colon: Secondary | ICD-10-CM

## 2021-04-15 DIAGNOSIS — R1319 Other dysphagia: Secondary | ICD-10-CM | POA: Diagnosis not present

## 2021-04-15 DIAGNOSIS — R1314 Dysphagia, pharyngoesophageal phase: Secondary | ICD-10-CM | POA: Diagnosis not present

## 2021-04-15 DIAGNOSIS — Z87891 Personal history of nicotine dependence: Secondary | ICD-10-CM | POA: Insufficient documentation

## 2021-04-15 HISTORY — PX: ESOPHAGOGASTRODUODENOSCOPY: SHX5428

## 2021-04-15 SURGERY — EGD (ESOPHAGOGASTRODUODENOSCOPY)
Anesthesia: General

## 2021-04-15 MED ORDER — SODIUM CHLORIDE 0.9 % IV SOLN: INTRAVENOUS | Status: DC

## 2021-04-15 MED ORDER — PROPOFOL 500 MG/50ML IV EMUL
INTRAVENOUS | Status: DC | PRN
  Administered 2021-04-15: 110 ug/kg/min via INTRAVENOUS

## 2021-04-15 MED ORDER — LIDOCAINE HCL (CARDIAC) PF 100 MG/5ML IV SOSY
PREFILLED_SYRINGE | INTRAVENOUS | Status: DC | PRN
  Administered 2021-04-15: 50 mg via INTRAVENOUS

## 2021-04-15 MED ORDER — PROPOFOL 10 MG/ML IV BOLUS
INTRAVENOUS | Status: DC | PRN
  Administered 2021-04-15: 80 mg via INTRAVENOUS

## 2021-04-15 MED ORDER — OMEPRAZOLE 40 MG PO CPDR: 40.0000 mg | DELAYED_RELEASE_CAPSULE | Freq: Every day | ORAL | 3 refills | Status: DC

## 2021-04-15 NOTE — Op Note (Signed)
[facility] Gastroenterology Patient Name: Cheryl Hays Procedure Date: 04/15/2021 11:10 AM MRN: [id number] Account #: [id number] Date of Birth: 06-06-1945 Admit Type: Outpatient Age: 75 Room: [facility] ENDO ROOM 2 Gender: Female Note Status: Finalized Instrument Name: Altamese Cabal Endoscope [id number] Procedure:             Upper GI endoscopy Indications:           Esophageal dysphagia Providers:             Lin Landsman MD, MD Referring MD:          Dionne Bucy. Bacigalupo (Referring MD) Medicines:             General Anesthesia Complications:         No immediate complications. Estimated blood loss: None. Procedure:             Pre-Anesthesia Assessment:                        - Prior to the procedure, a History and Physical was                         performed, and patient medications and allergies were                         reviewed. The patient is competent. The risks and                         benefits of the procedure and the sedation options and                         risks were discussed with the patient. All questions                         were answered and informed consent was obtained.                         Patient identification and proposed procedure were                         verified by the physician, the nurse, the                         anesthesiologist, the anesthetist and the technician                         in the pre-procedure area in the procedure room in the                         endoscopy suite. Mental Status Examination: alert and                         oriented. Airway Examination: normal oropharyngeal                         airway and neck mobility. Respiratory Examination:                         clear to auscultation. CV Examination: normal.  Prophylactic Antibiotics: The patient does not require                         prophylactic antibiotics. Prior Anticoagulants: The                          patient has taken no previous anticoagulant or                         antiplatelet agents. ASA Grade Assessment: II - A                         patient with mild systemic disease. After reviewing                         the risks and benefits, the patient was deemed in                         satisfactory condition to undergo the procedure. The                         anesthesia plan was to use monitored anesthesia care                         (MAC). Immediately prior to administration of                         medications, the patient was re-assessed for adequacy                         to receive sedatives. The heart rate, respiratory                         rate, oxygen saturations, blood pressure, adequacy of                         pulmonary ventilation, and response to care were                         monitored throughout the procedure. The physical                         status of the patient was re-assessed after the                         procedure.                        After obtaining informed consent, the endoscope was                         passed under direct vision. Throughout the procedure,                         the patient's blood pressure, pulse, and oxygen                         saturations were monitored continuously. The Endoscope  was introduced through the mouth, and advanced to the                         second part of duodenum. The upper GI endoscopy was                         accomplished without difficulty. The patient tolerated                         the procedure well. Findings:      The duodenal bulb and second portion of the duodenum were normal.      The entire examined stomach was normal.      The cardia and gastric fundus were normal on retroflexion.      A small hiatal hernia was present.      LA Grade A (one or more mucosal breaks less than 5 mm, not extending       between tops of 2 mucosal folds) esophagitis with no  bleeding was found       at the gastroesophageal junction.      Esophagogastric landmarks were identified: the gastroesophageal junction       was found at 33 cm from the incisors. Impression:            - Normal duodenal bulb and second portion of the                         duodenum.                        - Normal stomach.                        - Small hiatal hernia.                        - LA Grade A reflux esophagitis with no bleeding.                        - Esophagogastric landmarks identified.                        - No specimens collected. Recommendation:        - Discharge patient to home (with escort).                        - Resume previous diet today.                        - Follow an antireflux regimen indefinitely.                        - Use Prilosec (omeprazole) 40 mg PO daily for 3                         months. Procedure Code(s):     --- Professional ---                        [phone], Esophagogastroduodenoscopy, flexible,  transoral; diagnostic, including collection of                         specimen(s) by brushing or washing, when performed                         (separate procedure) Diagnosis Code(s):     --- Professional ---                        K44.9, Diaphragmatic hernia without obstruction or                         gangrene                        K21.00, Gastro-esophageal reflux disease with                         esophagitis, without bleeding                        R13.14, Dysphagia, pharyngoesophageal phase CPT copyright 2019 American Medical Association. All rights reserved. The codes documented in this report are preliminary and upon coder review may  be revised to meet current compliance requirements. Dr. Ulyess Mort Lin Landsman MD, MD 04/15/2021 11:24:10 AM This report has been signed electronically. Number of Addenda: 0 Note Initiated On: 04/15/2021 11:10 AM Estimated Blood Loss:  Estimated blood loss:  none.      [facility]

## 2021-04-15 NOTE — Anesthesia Preprocedure Evaluation (Signed)
Anesthesia Evaluation  Patient identified by MRN, date of birth, ID band Patient awake    Reviewed: Allergy & Precautions, NPO status , Patient's Chart, lab work & pertinent test results  History of Anesthesia Complications Negative for: history of anesthetic complications  Airway Mallampati: IV   Neck ROM: Full    Dental no notable dental hx.    Pulmonary asthma , former smoker (quit 1976),    Pulmonary exam normal breath sounds clear to auscultation       Cardiovascular Exercise Tolerance: Good negative cardio ROS Normal cardiovascular exam Rhythm:Regular Rate:Normal     Neuro/Psych negative neurological ROS     GI/Hepatic GERD  ,  Endo/Other  negative endocrine ROS  Renal/GU negative Renal ROS     Musculoskeletal   Abdominal   Peds  Hematology negative hematology ROS (+)   Anesthesia Other Findings   Reproductive/Obstetrics                             Anesthesia Physical Anesthesia Plan  ASA: 2  Anesthesia Plan: General   Post-op Pain Management:    Induction: Intravenous  PONV Risk Score and Plan: 3 and Propofol infusion, TIVA and Treatment may vary due to age or medical condition  Airway Management Planned: Natural Airway  Additional Equipment:   Intra-op Plan:   Post-operative Plan:   Informed Consent: I have reviewed the patients History and Physical, chart, labs and discussed the procedure including the risks, benefits and alternatives for the proposed anesthesia with the patient or authorized representative who has indicated his/her understanding and acceptance.       Plan Discussed with: CRNA  Anesthesia Plan Comments: (LMA/GETA backup discussed.  Patient consented for risks of anesthesia including but not limited to:  - adverse reactions to medications - damage to eyes, teeth, lips or other oral mucosa - nerve damage due to positioning  - sore throat or  hoarseness - damage to heart, brain, nerves, lungs, other parts of body or loss of life  Informed patient about role of CRNA in peri- and intra-operative care.  Patient voiced understanding.)        Anesthesia Quick Evaluation

## 2021-04-15 NOTE — Transfer of Care (Signed)
Immediate Anesthesia Transfer of Care Note  Patient: [employer]  Procedure(s) Performed: ESOPHAGOGASTRODUODENOSCOPY (EGD)  Patient Location: PACU and Endoscopy Unit  Anesthesia Type:General  Level of Consciousness: drowsy  Airway & Oxygen Therapy: Patient Spontanous Breathing  Post-op Assessment: Report given to RN  Post vital signs: stable  Last Vitals:  Vitals Value Taken Time  BP    Temp    Pulse 66 04/15/21 1123  Resp 13 04/15/21 1123  SpO2 99 % 04/15/21 1123  Vitals shown include unvalidated device data.  Last Pain:  Vitals:   04/15/21 1032  TempSrc: Temporal  PainSc: 0-No pain         Complications: No notable events documented.

## 2021-04-15 NOTE — Anesthesia Postprocedure Evaluation (Signed)
Anesthesia Post Note  Patient: Cheryl Hays  Procedure(s) Performed: ESOPHAGOGASTRODUODENOSCOPY (EGD)  Patient location during evaluation: PACU Anesthesia Type: General Level of consciousness: awake and alert, oriented and patient cooperative Pain management: pain level controlled Vital Signs Assessment: post-procedure vital signs reviewed and stable Respiratory status: spontaneous breathing, nonlabored ventilation and respiratory function stable Cardiovascular status: blood pressure returned to baseline and stable Postop Assessment: adequate PO intake Anesthetic complications: no   No notable events documented.   Last Vitals:  Vitals:   04/15/21 1134 04/15/21 1144  BP: (!) 105/56 (!) 123/99  Pulse: 68 64  Resp: 11 17  Temp:    SpO2: 100% 100%    Last Pain:  Vitals:   04/15/21 1144  TempSrc:   PainSc: 0-No pain                 Darrin Nipper

## 2021-04-15 NOTE — H&P (Signed)
Cheryl Darby, MD [address]  [place]  [place], [place] [address]  Main: [phone]  Fax: [phone] Pager: [phone]  Primary Care Physician:  Virginia Crews, MD Primary Gastroenterologist:  Dr. Cephas Hays  Pre-Procedure History & Physical: HPI:  Cheryl Hays is a 75 y.o. female is here for an colonoscopy.   Past Medical History:  Diagnosis Date   Abnormal LFTs 10/31/2014   Asthma    Dyspnea    with colds   GERD (gastroesophageal reflux disease)    Hyperlipidemia    Pneumonia     Past Surgical History:  Procedure Laterality Date   BREAST SURGERY Left 2000   biopsy   CESAREAN SECTION  05/08/1978, 07/03/1979   DILATION AND CURETTAGE OF UTERUS  1993   FACIAL COSMETIC SURGERY  2006   SKIN SURGERY Right    hand 02/2016 x's 2 Dr. Nehemiah Massed   WRIST FRACTURE SURGERY Right     Prior to Admission medications   Medication Sig Start Date End Date Taking? Authorizing Provider  atorvastatin (LIPITOR) 20 MG tablet Take 1 tablet (20 mg total) by mouth every other day. 01/15/21  Yes Bacigalupo, Dionne Bucy, MD  cetirizine (ZYRTEC) 10 MG tablet Take 1 tablet (10 mg total) by mouth daily. Patient taking differently: Take 10 mg by mouth as needed. 05/26/20  Yes Bacigalupo, Dionne Bucy, MD  fluticasone (FLONASE) 50 MCG/ACT nasal spray Place 2 sprays into both nostrils daily. Patient taking differently: Place 2 sprays into both nostrils as needed. 06/28/19  Yes Bacigalupo, Dionne Bucy, MD  lisinopril (ZESTRIL) 20 MG tablet Take 1 tablet (20 mg total) by mouth daily. 01/15/21  Yes Bacigalupo, Dionne Bucy, MD  Nutritional Supplements (VITAL HIGH PROTEIN PO) Take by mouth.   Yes [provider]  Calcium Carb-Cholecalciferol 600-800 MG-UNIT TABS Take 1 tablet by mouth daily. 1200-800    [provider]  meloxicam (MOBIC) 7.5 MG tablet Take 1 tablet (7.5 mg total) by mouth daily. Patient not taking: Reported on 04/15/2021 03/23/15   Margarita Rana, MD  montelukast  (SINGULAIR) 10 MG tablet Take 1 tablet (10 mg total) by mouth daily. Patient not taking: Reported on 04/15/2021 08/03/17   Virginia Crews, MD    Allergies as of 03/19/2021 - Review Complete 03/18/2021  Allergen Reaction Noted   Celecoxib  10/31/2014   Levofloxacin Other (See Comments) 03/23/2016   Sulfa antibiotics  10/31/2014   Pseudoephedrine Rash 10/31/2014    Family History  Problem Relation Age of Onset   Heart murmur Mother    Vision loss Father    Bladder Cancer Brother     Social History   Socioeconomic History   Marital status: Widowed    Spouse name: Not on file   Number of children: 2   Years of education: 14   Highest education level: Some college, no degree  Occupational History   Occupation: retired  Tobacco Use   Smoking status: Former    Types: Cigarettes    Quit date: 05/16/1975    Years since quitting: 45.9   Smokeless tobacco: Never  Vaping Use   Vaping Use: Never used  Substance and Sexual Activity   Alcohol use: Yes    Comment: occassionally   Drug use: No   Sexual activity: Not on file  Other Topics Concern   Not on file  Social History Narrative   Not on file   Social Determinants of Health   Financial Resource Strain: Not on file  Food Insecurity: Not  on file  Transportation Needs: Not on file  Physical Activity: Not on file  Stress: Not on file  Social Connections: Not on file  Intimate Partner Violence: Not on file    Review of Systems: See HPI, otherwise negative ROS  Physical Exam: BP (!) 187/85   Pulse 69   Temp (!) 96.6 F (35.9 C) (Temporal)   Resp 16   Ht 5' (1.524 m)   Wt 59 kg   SpO2 100%   BMI 25.39 kg/m  General:   Alert,  pleasant and cooperative in NAD Head:  Normocephalic and atraumatic. Neck:  Supple; no masses or thyromegaly. Lungs:  Clear throughout to auscultation.    Heart:  Regular rate and rhythm. Abdomen:  Soft, nontender and nondistended. Normal bowel sounds, without guarding, and without  rebound.   Neurologic:  Alert and  oriented x4;  grossly normal neurologically.  Impression/Plan: [name] is here for an endoscopy to be performed for dysphagia  Risks, benefits, limitations, and alternatives regarding  endoscopy have been reviewed with the patient.  Questions have been answered.  All parties agreeable.   Sherri Sear, MD  04/15/2021, 11:08 AM

## 2021-04-16 ENCOUNTER — Encounter: Payer: Self-pay | Admitting: Gastroenterology

## 2021-05-03 ENCOUNTER — Ambulatory Visit
Admission: RE | Admit: 2021-05-03 | Discharge: 2021-05-03 | Disposition: A | Discharge status: Home / Self Care | Payer: PPO | Source: Ambulatory Visit | Attending: Family Medicine | Admitting: Family Medicine

## 2021-05-03 DIAGNOSIS — Z1231 Encounter for screening mammogram for malignant neoplasm of breast: Secondary | ICD-10-CM | POA: Diagnosis not present

## 2021-05-26 NOTE — Progress Notes (Signed)
Annual Wellness Visit     Patient: Cheryl Hays, Female    DOB: 1945-12-28, 76 y.o.   MRN: [id number] Visit Date: 05/28/2021  Today's Provider: Lavon Paganini, MD   Chief Complaint  Patient presents with   Medicare Wellness   I,Cheryl Hays,acting as a scribe for Lavon Paganini, MD.,have documented all relevant documentation on the behalf of Lavon Paganini, MD,as directed by  Lavon Paganini, MD while in the presence of Lavon Paganini, MD.  Cheryl Hays is a 76 y.o. female who presents today for her Annual Wellness Visit. She reports consuming a general diet. Home exercise routine includes stretching. She generally feels well. She reports sleeping well. She does have additional problems to discuss today.   HPI Mammogram:05/03/21-Normal Bone density: 05/18/2017-Osteopenia-repeat in 2 yrs   Medications: Outpatient Medications Prior to Visit  Medication Sig   Calcium Carb-Cholecalciferol 600-800 MG-UNIT TABS Take 1 tablet by mouth daily. 1200-800   cetirizine (ZYRTEC) 10 MG tablet Take 1 tablet (10 mg total) by mouth daily. (Patient taking differently: Take 10 mg by mouth as needed.)   fluticasone (FLONASE) 50 MCG/ACT nasal spray Place 2 sprays into both nostrils daily. (Patient taking differently: Place 2 sprays into both nostrils as needed.)   meloxicam (MOBIC) 7.5 MG tablet Take 1 tablet (7.5 mg total) by mouth daily.   montelukast (SINGULAIR) 10 MG tablet Take 1 tablet (10 mg total) by mouth daily.   Nutritional Supplements (VITAL HIGH PROTEIN PO) Take by mouth.   [DISCONTINUED] atorvastatin (LIPITOR) 20 MG tablet Take 1 tablet (20 mg total) by mouth every other day.   [DISCONTINUED] lisinopril (ZESTRIL) 20 MG tablet Take 1 tablet (20 mg total) by mouth daily.   omeprazole (PRILOSEC) 40 MG capsule Take 1 capsule (40 mg total) by mouth daily before breakfast.   No facility-administered medications prior to visit.    Allergies   Allergen Reactions   Celecoxib    Levofloxacin Other (See Comments)    Muscle aches   Sulfa Antibiotics    Pseudoephedrine Rash    Patient Care Team: Virginia Crews, MD as PCP - General (Family Medicine) Marlyn Corporal Clearnce Sorrel, PA-C as Physician Assistant (Family Medicine) Ralene Bathe, MD as Consulting Physician (Dermatology) Eulogio Bear, MD as Consulting Physician (Ophthalmology)  Review of Systems  Constitutional: Negative.   HENT:  Positive for drooling.   Eyes: Negative.   Respiratory: Negative.    Cardiovascular: Negative.   Gastrointestinal: Negative.   Endocrine: Positive for polyuria.  Genitourinary: Negative.   Musculoskeletal: Negative.   Skin: Negative.   Allergic/Immunologic: Negative.   Neurological: Negative.   Hematological:  Bruises/bleeds easily.  Psychiatric/Behavioral: Negative.     Last CBC Lab Results  Component Value Date   WBC 5.9 04/05/2019   HGB 14.3 04/05/2019   HCT 41.5 04/05/2019   MCV 85 04/05/2019   MCH 29.3 04/05/2019   RDW 13.0 04/05/2019   PLT 209 65/46/5035   Last metabolic panel Lab Results  Component Value Date   GLUCOSE 90 01/15/2021   NA 140 01/15/2021   K 4.6 01/15/2021   CL 101 01/15/2021   CO2 25 01/15/2021   BUN 18 01/15/2021   CREATININE 0.70 01/15/2021   EGFR 90 01/15/2021   CALCIUM 9.4 01/15/2021   PHOS 3.3 09/12/2017   PROT 6.7 01/15/2021   ALBUMIN 4.4 01/15/2021   LABGLOB 2.3 01/15/2021   AGRATIO 1.9 01/15/2021   BILITOT 0.7 01/15/2021   ALKPHOS 88 01/15/2021  AST 25 01/15/2021   ALT 22 01/15/2021   Last lipids Lab Results  Component Value Date   CHOL 157 05/26/2020   HDL 84 05/26/2020   LDLCALC 62 05/26/2020   TRIG 54 05/26/2020   CHOLHDL 1.9 05/26/2020   Last hemoglobin A1c Lab Results  Component Value Date   HGBA1C 5.7 (H) 01/15/2021   Last thyroid functions Lab Results  Component Value Date   TSH 3.100 04/02/2015        Objective    Vitals: BP 130/85 Comment:  home readings   Pulse 78    Temp 98 F (36.7 C) (Temporal)    Resp 16    Ht $R'4\' 11"'VM$  (1.499 m)    Wt 134 lb 4.8 oz (60.9 kg)    SpO2 100%    BMI 27.13 kg/m  BP Readings from Last 3 Encounters:  05/28/21 130/85  04/15/21 (!) 123/99  03/18/21 140/85   Wt Readings from Last 3 Encounters:  05/28/21 134 lb 4.8 oz (60.9 kg)  04/15/21 130 lb (59 kg)  03/18/21 134 lb (60.8 kg)      Physical Exam Vitals reviewed.  Constitutional:      General: She is not in acute distress.    Appearance: Normal appearance. She is well-developed. She is not diaphoretic.  HENT:     Head: Normocephalic and atraumatic.     Right Ear: Tympanic membrane, ear canal and external ear normal.     Left Ear: Tympanic membrane, ear canal and external ear normal.     Nose: Nose normal.     Mouth/Throat:     Mouth: Mucous membranes are moist.     Pharynx: Oropharynx is clear. No oropharyngeal exudate.  Eyes:     General: No scleral icterus.    Conjunctiva/sclera: Conjunctivae normal.     Pupils: Pupils are equal, round, and reactive to light.  Neck:     Thyroid: No thyromegaly.  Cardiovascular:     Rate and Rhythm: Normal rate and regular rhythm.     Pulses: Normal pulses.     Heart sounds: Normal heart sounds. No murmur heard. Pulmonary:     Effort: Pulmonary effort is normal. No respiratory distress.     Breath sounds: Normal breath sounds. No wheezing or rales.  Abdominal:     General: There is no distension.     Palpations: Abdomen is soft.     Tenderness: There is no abdominal tenderness.  Musculoskeletal:        General: No deformity.     Cervical back: Neck supple.     Right lower leg: No edema.     Left lower leg: No edema.  Lymphadenopathy:     Cervical: No cervical adenopathy.  Skin:    General: Skin is warm and dry.     Findings: No rash.  Neurological:     Mental Status: She is alert and oriented to person, place, and time. Mental status is at baseline.     Sensory: No sensory deficit.      Motor: No weakness.     Gait: Gait normal.  Psychiatric:        Mood and Affect: Mood normal.        Behavior: Behavior normal.        Thought Content: Thought content normal.     Most recent functional status assessment: In your present state of health, do you have any difficulty performing the following activities: 05/27/2021  Hearing? N  Vision? N  Difficulty concentrating  or making decisions? N  Walking or climbing stairs? N  Dressing or bathing? N  Doing errands, shopping? N  Preparing Food and eating ? N  Using the Toilet? N  In the past six months, have you accidently leaked urine? Y  Do you have problems with loss of bowel control? N  Managing your Medications? N  Managing your Finances? N  Housekeeping or managing your Housekeeping? N  Some recent data might be hidden   Most recent fall risk assessment: Fall Risk  05/27/2021  Falls in the past year? 0  Comment -  Number falls in past yr: 0  Comment -  Injury with Fall? 0  Risk for fall due to : -  Follow up -    Most recent depression screenings: PHQ 2/9 Scores 01/15/2021 05/26/2020  PHQ - 2 Score 0 0  PHQ- 9 Score 0 0   Most recent cognitive screening: 6CIT Screen 05/26/2020  What Year? (No Data)   Most recent Audit-C alcohol use screening Alcohol Use Disorder Test (AUDIT) 05/27/2021  1. How often do you have a drink containing alcohol? 3  2. How many drinks containing alcohol do you have on a typical day when you are drinking? 0  3. How often do you have six or more drinks on one occasion? 0  AUDIT-C Score 3  4. How often during the last year have you found that you were not able to stop drinking once you had started? -  5. How often during the last year have you failed to do what was normally expected from you because of drinking? -  6. How often during the last year have you needed a first drink in the morning to get yourself going after a heavy drinking session? -  7. How often during the last year have  you had a feeling of guilt of remorse after drinking? -  8. How often during the last year have you been unable to remember what happened the night before because you had been drinking? -  9. Have you or someone else been injured as a result of your drinking? -  10. Has a relative or friend or a doctor or another health worker been concerned about your drinking or suggested you cut down? -  Alcohol Use Disorder Identification Test Final Score (AUDIT) -  Alcohol Brief Interventions/Follow-up -   A score of 3 or more in women, and 4 or more in men indicates increased risk for alcohol abuse, EXCEPT if all of the points are from question 1   No results found for any visits on 05/28/21.  Assessment & Plan     Annual wellness visit done today including the all of the following: Reviewed patient's Family Medical History Reviewed and updated list of patient's medical providers Assessment of cognitive impairment was done Assessed patient's functional ability Established a written schedule for health screening [place] Completed and Reviewed  Exercise Activities and Dietary recommendations  Goals      Exercise 3x per week (30 min per time)     Recommend to exercise for 3 days a week for at least 30 minutes at a time.       Increase water intake     Recommend increasing water intake to 4-6 glasses a day.         Immunization History  Administered Date(s) Administered   Fluad Quad(high Dose 65+) 01/21/2019, 01/15/2021   Influenza, High Dose Seasonal PF 03/01/2017, 01/23/2018  Influenza-Unspecified 02/01/2016, 03/26/2020   PFIZER(Purple Top)SARS-COV-2 Vaccination 05/21/2019, 06/11/2019, 02/03/2020   Pfizer Covid-19 Vaccine Bivalent Booster 5y-11y 10/05/2020, 03/17/2021   Pneumococcal Conjugate-13 04/16/2014   Pneumococcal Polysaccharide-23 03/31/2008, 03/23/2016   Tdap 06/03/2011   Zoster Recombinat (Shingrix) 05/18/2021   Zoster, Live 05/06/2009    Health  Maintenance  Topic Date Due   DEXA SCAN  05/19/2019   TETANUS/TDAP  06/02/2021   Zoster Vaccines- Shingrix (2 of 2) 07/13/2021   Pneumonia Vaccine 68+ Years old  Completed   INFLUENZA VACCINE  Completed   COVID-19 Vaccine  Completed   Hepatitis C Screening  Completed   HPV VACCINES  Aged Out   Fecal DNA (Cologuard)  Discontinued     Discussed health benefits of physical activity, and encouraged her to engage in regular exercise appropriate for her age and condition.    Problem List Items Addressed This Visit       Cardiovascular and Mediastinum   Essential hypertension    Well controlled Continue current medications Recheck metabolic panel F/u in 6 months       Relevant Medications   lisinopril (ZESTRIL) 20 MG tablet   atorvastatin (LIPITOR) 20 MG tablet   Other Relevant Orders   Comprehensive metabolic panel     Respiratory   Asthma    Chronic and well controlled No recent flares Continue current meds        Musculoskeletal and Integument   OP (osteoporosis)    Not on meds Continue exercise, Ca/Vit D Repeat DEXA      Relevant Orders   DG Bone Density     Other   HLD (hyperlipidemia)    Previously well controlled Continue statin Repeat FLP and CMP      Relevant Medications   lisinopril (ZESTRIL) 20 MG tablet   atorvastatin (LIPITOR) 20 MG tablet   Other Relevant Orders   Lipid panel   Comprehensive metabolic panel   Prediabetes    Recommend low carb diet Recheck A1c      Relevant Orders   Hemoglobin A1c   Other Visit Diagnoses     Encounter for annual wellness visit (AWV) in Medicare patient    -  Primary   Encounter for annual physical exam       Relevant Orders   Lipid panel   Hemoglobin A1c   Comprehensive metabolic panel        Return in about 6 months (around 11/25/2021) for chronic disease f/u.     I, Lavon Paganini, MD, have reviewed all documentation for this visit. The documentation on 05/28/21 for the exam, diagnosis,  procedures, and orders are all accurate and complete.   Latiesha Harada, Dionne Bucy, MD, MPH [facility]

## 2021-05-28 ENCOUNTER — Other Ambulatory Visit: Payer: Self-pay

## 2021-05-28 ENCOUNTER — Encounter: Payer: Self-pay | Admitting: Family Medicine

## 2021-05-28 ENCOUNTER — Ambulatory Visit (INDEPENDENT_AMBULATORY_CARE_PROVIDER_SITE_OTHER): Payer: PPO | Admitting: Family Medicine

## 2021-05-28 VITALS — BP 130/85 | HR 78 | Temp 98.0°F | Resp 16 | Ht 59.0 in | Wt 134.3 lb

## 2021-05-28 DIAGNOSIS — Z0000 Encounter for general adult medical examination without abnormal findings: Secondary | ICD-10-CM | POA: Diagnosis not present

## 2021-05-28 DIAGNOSIS — M810 Age-related osteoporosis without current pathological fracture: Secondary | ICD-10-CM

## 2021-05-28 DIAGNOSIS — J4520 Mild intermittent asthma, uncomplicated: Secondary | ICD-10-CM | POA: Diagnosis not present

## 2021-05-28 DIAGNOSIS — E7800 Pure hypercholesterolemia, unspecified: Secondary | ICD-10-CM

## 2021-05-28 DIAGNOSIS — R7303 Prediabetes: Secondary | ICD-10-CM

## 2021-05-28 DIAGNOSIS — I10 Essential (primary) hypertension: Secondary | ICD-10-CM

## 2021-05-28 MED ORDER — ATORVASTATIN CALCIUM 20 MG PO TABS: 20.0000 mg | ORAL_TABLET | ORAL | 3 refills | Status: DC

## 2021-05-28 MED ORDER — LISINOPRIL 20 MG PO TABS: 20.0000 mg | ORAL_TABLET | Freq: Every day | ORAL | 3 refills | Status: DC

## 2021-05-28 NOTE — Patient Instructions (Addendum)
Look at [facility] travel website for the countries you plan to visit  Ask at the pharmacy about TDAP (the tetanus shot)

## 2021-05-28 NOTE — Assessment & Plan Note (Signed)
Recommend low carb diet °Recheck A1c  °

## 2021-05-28 NOTE — Assessment & Plan Note (Signed)
Previously well controlled Continue statin Repeat FLP and CMP  

## 2021-05-28 NOTE — Assessment & Plan Note (Signed)
Chronic and well controlled No recent flares Continue current meds

## 2021-05-28 NOTE — Assessment & Plan Note (Signed)
Well controlled Continue current medications Recheck metabolic panel F/u in 6 months  

## 2021-05-28 NOTE — Assessment & Plan Note (Signed)
Not on meds Continue exercise, Ca/Vit D Repeat DEXA

## 2021-06-01 ENCOUNTER — Encounter: Payer: Self-pay | Admitting: Family Medicine

## 2021-06-01 MED ORDER — CETIRIZINE HCL 10 MG PO TABS: 10.0000 mg | ORAL_TABLET | Freq: Every day | ORAL | 3 refills | Status: DC

## 2021-06-03 DIAGNOSIS — R7303 Prediabetes: Secondary | ICD-10-CM | POA: Diagnosis not present

## 2021-06-03 DIAGNOSIS — E7800 Pure hypercholesterolemia, unspecified: Secondary | ICD-10-CM | POA: Diagnosis not present

## 2021-06-03 DIAGNOSIS — Z0000 Encounter for general adult medical examination without abnormal findings: Secondary | ICD-10-CM | POA: Diagnosis not present

## 2021-06-03 DIAGNOSIS — I10 Essential (primary) hypertension: Secondary | ICD-10-CM | POA: Diagnosis not present

## 2021-06-04 LAB — COMPREHENSIVE METABOLIC PANEL
ALT: 18 IU/L (ref 0–32)
AST: 23 IU/L (ref 0–40)
Albumin/Globulin Ratio: 2 (ref 1.2–2.2)
Albumin: 4.4 g/dL (ref 3.7–4.7)
Alkaline Phosphatase: 93 IU/L (ref 44–121)
BUN/Creatinine Ratio: 22 (ref 12–28)
BUN: 16 mg/dL (ref 8–27)
Bilirubin Total: 0.7 mg/dL (ref 0.0–1.2)
CO2: 25 mmol/L (ref 20–29)
Calcium: 9.7 mg/dL (ref 8.7–10.3)
Chloride: 101 mmol/L (ref 96–106)
Creatinine, Ser: 0.72 mg/dL (ref 0.57–1.00)
Globulin, Total: 2.2 g/dL (ref 1.5–4.5)
Glucose: 97 mg/dL (ref 70–99)
Potassium: 4.7 mmol/L (ref 3.5–5.2)
Sodium: 142 mmol/L (ref 134–144)
Total Protein: 6.6 g/dL (ref 6.0–8.5)
eGFR: 86 mL/min/{1.73_m2} (ref >59)

## 2021-06-04 LAB — HEMOGLOBIN A1C
Est. average glucose Bld gHb Est-mCnc: 117 mg/dL
Hgb A1c MFr Bld: 5.7 % — ABNORMAL HIGH (ref 4.8–5.6)

## 2021-06-04 LAB — LIPID PANEL
Chol/HDL Ratio: 2.3 ratio (ref 0.0–4.4)
Cholesterol, Total: 165 mg/dL (ref 100–199)
HDL: 73 mg/dL (ref >39)
LDL Chol Calc (NIH): 76 mg/dL (ref 0–99)
Triglycerides: 87 mg/dL (ref 0–149)
VLDL Cholesterol Cal: 16 mg/dL (ref 5–40)

## 2021-07-01 ENCOUNTER — Other Ambulatory Visit: Payer: PPO

## 2021-07-16 DIAGNOSIS — M5416 Radiculopathy, lumbar region: Secondary | ICD-10-CM | POA: Diagnosis not present

## 2021-07-19 ENCOUNTER — Other Ambulatory Visit: Payer: Self-pay

## 2021-07-19 ENCOUNTER — Encounter: Payer: Self-pay | Admitting: Family Medicine

## 2021-07-19 ENCOUNTER — Ambulatory Visit: Payer: Self-pay | Admitting: *Deleted

## 2021-07-19 ENCOUNTER — Ambulatory Visit (INDEPENDENT_AMBULATORY_CARE_PROVIDER_SITE_OTHER): Payer: PPO | Admitting: Family Medicine

## 2021-07-19 VITALS — BP 159/73 | HR 81 | Resp 16 | Wt 131.1 lb

## 2021-07-19 DIAGNOSIS — M5416 Radiculopathy, lumbar region: Secondary | ICD-10-CM

## 2021-07-19 NOTE — Progress Notes (Signed)
?  ? ? ?Established patient visit ? ? ?Patient: Cheryl Hays   DOB: [birth year]   76 y.o. Female  MRN: [id number] ?Visit Date: 07/19/2021 ? ?Today's healthcare provider: Lelon Huh, MD  ? ?Chief Complaint  ?Patient presents with  ? Back Pain  ?  Patient reports that she injured herself while visiting in [place] 07/02/21 and states that she was being seen by doctor at [facility], she states that she has a MRI scheduled for tomorrow, but states that she would like to get this done sooner.   ? ?Subjective  ?  ?HPI ? ? States she woke up one morning while in [place] with back pain radiating to her legs. There was no injury or trauma. She had physical therapy while in [place] which was helpful. She states that she would like to resume PT but [facility] would not order PT before her follow up visit in late March or April. States since returning to [place] pain has worsened again and keeping her up at night.  ? ?Medications: ?Outpatient Medications Prior to Visit  ?Medication Sig  ? atorvastatin (LIPITOR) 20 MG tablet Take 1 tablet (20 mg total) by mouth every other day.  ? Calcium Carb-Cholecalciferol 600-800 MG-UNIT TABS Take 1 tablet by mouth daily. 1200-800  ? cetirizine (ZYRTEC) 10 MG tablet Take 1 tablet (10 mg total) by mouth daily.  ? fluticasone (FLONASE) 50 MCG/ACT nasal spray Place 2 sprays into both nostrils daily. (Patient taking differently: Place 2 sprays into both nostrils as needed.)  ? lisinopril (ZESTRIL) 20 MG tablet Take 1 tablet (20 mg total) by mouth daily.  ? meloxicam (MOBIC) 7.5 MG tablet Take 1 tablet (7.5 mg total) by mouth daily.  ? montelukast (SINGULAIR) 10 MG tablet Take 1 tablet (10 mg total) by mouth daily.  ? Nutritional Supplements (VITAL HIGH PROTEIN PO) Take by mouth.  ? omeprazole (PRILOSEC) 40 MG capsule Take 1 capsule (40 mg total) by mouth daily before breakfast.  ? ?No facility-administered medications prior to visit.  ? ? ? ?  Objective  ?  ?BP (!) 159/73    Pulse 81   Resp 16   Wt 131 lb 1.6 oz (59.5 kg)   SpO2 100%   BMI 26.48 kg/m?  ? ? ?Physical Exam  ? ?General appearance:  ?Well developed, well nourished female, cooperative and in no acute distress ?Head: Normocephalic, without obvious abnormality, atraumatic ?Respiratory: Respirations even and unlabored, normal respiratory rate ?Extremities: All extremities are intact.  ?Skin: Skin color, texture, turgor normal. No rashes seen  ?Psych: Appropriate mood and affect. ?Neurologic: Mental status: Alert, oriented to person, place, and time, thought content appropriate.  ? ? Assessment & Plan  ?  ? ?1. Lumbar radiculitis ?Was doing well with PT when she was in [place] which she would like to resume.  ?- Ambulatory referral to Physical Therapy  ? ?MRI ordered by Rosanne Gutting has already been approved and scheduled for 07/20/2021. Asked her to request a copy of MRI report be sent here to see If neurosurgery referral would be appropriate.  ?  ?Addressed extensive list of chronic and acute medical problems today requiring 30 minutes reviewing her medical record, counseling patient regarding her conditions and coordination of care.    ?   ? ?The entirety of the information documented in the History of Present Illness, Review of Systems and Physical Exam were personally obtained by me. Portions of this information were initially documented by the CMA and reviewed by me for thoroughness and  accuracy.   ? ?[employer] as a Education administrator for Lelon Huh, MD.,have documented all relevant documentation on the behalf of Lelon Huh, MD,as directed by  Lelon Huh, MD while in the presence of Lelon Huh, MD.  ? ?Lelon Huh, MD  ?[facility] ?[phone] (phone) ?[phone] (fax) ? ?[facility] ?

## 2021-07-19 NOTE — Telephone Encounter (Signed)
?  Chief Complaint: fracture ?Symptoms: compressed vertebrae fx, back pain, leg weakness  ?Frequency: since 07/02/21 ?Pertinent Negatives: NA ?Disposition: '[]'$ ED /'[]'$ Urgent Care (no appt availability in office) / '[x]'$ Appointment(In office/virtual)/ '[]'$  [facility]/ '[]'$ Home Care/ '[]'$ Refused Recommended Disposition /'[]'$ [facility]/ '[]'$  Follow-up with PCP ?Additional Notes: pt was in [place], found out she had fx, had back injections which helped pain but is having to walk around with walker now. Pt went to [facility] here and is scheduled for MRI on 07/20/21 but was told her results she wouldn't get them until 07/28/21 and has to go to [facility] to see the provider. Pt is upset and just wants to get help and get PT set up. Spoke with Arbie Cookey, [facility] who was able to work pt in today with Dr. Caryn Section.   ? ? ?Reason for Disposition ? [1] SEVERE pain (e.g., excruciating) AND [2] not improved 2 hours after pain medicine/ice packs ? ?Answer Assessment - Initial Assessment Questions ?1. MECHANISM: "How did the injury happen?" (Consider the possibility of domestic violence or elder abuse) ?    Unsure how it happened ?2. ONSET: "When did the injury happen?" (Minutes or hours ago) ?    07/02/21 ?3. LOCATION: "What part of the back is injured?" ?    Unsure what part  ?4. SEVERITY: "Can you move the back normally?" ?    No ?5. PAIN: "Is there any pain?" If Yes, ask: "How bad is the pain?"   (Scale 1-10; or mild, moderate, severe) ?    7 ?6. CORD SYMPTOMS: Any weakness or numbness of the arms or legs?" ?    Weakness in legs ?9. OTHER SYMPTOMS: "Do you have any other symptoms?" (e.g., abdominal pain, blood in urine) ?    Walking with walker at this ? ?Protocols used: Back Injury-A-AH ? ?

## 2021-07-19 NOTE — Telephone Encounter (Signed)
Summary: advice about an MRI  ? Pt went to [place] and got a compressed vertebrae fracture while she was there.  Pt had to come home early.  Pt went to [facility] walk in. From here they ordered a MRI which is Tues 3/07.  Then they advised pt the MRI will not be read until 3/15.  Pt thought they were [place], but no.  Pt is leaving 4/01 for a 6 mo beach trip. Pt has to walk w/ walker  ?Pt needs to get something organized and wants to know if Dr B can help.  ?Pt declined appt, she asked me why when she has already has been seen.   ?  ? ? ?Called patient to review what she would like from PCP. No answer, LVMTCB [phone]. ?

## 2021-07-20 ENCOUNTER — Ambulatory Visit: Payer: PPO | Admitting: Family Medicine

## 2021-07-20 DIAGNOSIS — M5416 Radiculopathy, lumbar region: Secondary | ICD-10-CM | POA: Diagnosis not present

## 2021-07-21 DIAGNOSIS — M4727 Other spondylosis with radiculopathy, lumbosacral region: Secondary | ICD-10-CM | POA: Diagnosis not present

## 2021-07-22 DIAGNOSIS — M4727 Other spondylosis with radiculopathy, lumbosacral region: Secondary | ICD-10-CM | POA: Diagnosis not present

## 2021-07-23 ENCOUNTER — Encounter: Payer: Self-pay | Admitting: Family Medicine

## 2021-07-23 DIAGNOSIS — M4727 Other spondylosis with radiculopathy, lumbosacral region: Secondary | ICD-10-CM | POA: Diagnosis not present

## 2021-07-26 DIAGNOSIS — M4727 Other spondylosis with radiculopathy, lumbosacral region: Secondary | ICD-10-CM | POA: Diagnosis not present

## 2021-07-28 DIAGNOSIS — M5416 Radiculopathy, lumbar region: Secondary | ICD-10-CM | POA: Diagnosis not present

## 2021-07-29 DIAGNOSIS — M4727 Other spondylosis with radiculopathy, lumbosacral region: Secondary | ICD-10-CM | POA: Diagnosis not present

## 2021-08-02 ENCOUNTER — Other Ambulatory Visit: Payer: PPO

## 2021-08-02 DIAGNOSIS — M4727 Other spondylosis with radiculopathy, lumbosacral region: Secondary | ICD-10-CM | POA: Diagnosis not present

## 2021-08-04 DIAGNOSIS — M5416 Radiculopathy, lumbar region: Secondary | ICD-10-CM | POA: Diagnosis not present

## 2021-08-05 DIAGNOSIS — M4727 Other spondylosis with radiculopathy, lumbosacral region: Secondary | ICD-10-CM | POA: Diagnosis not present

## 2021-08-09 DIAGNOSIS — M4727 Other spondylosis with radiculopathy, lumbosacral region: Secondary | ICD-10-CM | POA: Diagnosis not present

## 2021-08-10 ENCOUNTER — Other Ambulatory Visit: Payer: Self-pay

## 2021-08-10 ENCOUNTER — Ambulatory Visit
Admission: RE | Admit: 2021-08-10 | Discharge: 2021-08-10 | Disposition: A | Discharge status: Home / Self Care | Payer: PPO | Source: Ambulatory Visit | Attending: Family Medicine | Admitting: Family Medicine

## 2021-08-10 DIAGNOSIS — M810 Age-related osteoporosis without current pathological fracture: Secondary | ICD-10-CM | POA: Diagnosis not present

## 2021-08-10 DIAGNOSIS — M85859 Other specified disorders of bone density and structure, unspecified thigh: Secondary | ICD-10-CM | POA: Diagnosis not present

## 2021-08-12 DIAGNOSIS — M4727 Other spondylosis with radiculopathy, lumbosacral region: Secondary | ICD-10-CM | POA: Diagnosis not present

## 2021-08-17 DIAGNOSIS — M545 Low back pain, unspecified: Secondary | ICD-10-CM | POA: Diagnosis not present

## 2021-08-18 ENCOUNTER — Encounter: Payer: Self-pay | Admitting: Family Medicine

## 2021-08-19 DIAGNOSIS — M545 Low back pain, unspecified: Secondary | ICD-10-CM | POA: Diagnosis not present

## 2021-08-23 DIAGNOSIS — M545 Low back pain, unspecified: Secondary | ICD-10-CM | POA: Diagnosis not present

## 2021-08-25 DIAGNOSIS — M545 Low back pain, unspecified: Secondary | ICD-10-CM | POA: Diagnosis not present

## 2021-08-26 DIAGNOSIS — M545 Low back pain, unspecified: Secondary | ICD-10-CM | POA: Diagnosis not present

## 2021-08-30 DIAGNOSIS — M545 Low back pain, unspecified: Secondary | ICD-10-CM | POA: Diagnosis not present

## 2021-09-01 DIAGNOSIS — M545 Low back pain, unspecified: Secondary | ICD-10-CM | POA: Diagnosis not present

## 2021-09-02 DIAGNOSIS — M545 Low back pain, unspecified: Secondary | ICD-10-CM | POA: Diagnosis not present

## 2021-09-06 DIAGNOSIS — M545 Low back pain, unspecified: Secondary | ICD-10-CM | POA: Diagnosis not present

## 2021-09-08 DIAGNOSIS — M545 Low back pain, unspecified: Secondary | ICD-10-CM | POA: Diagnosis not present

## 2021-09-09 DIAGNOSIS — M545 Low back pain, unspecified: Secondary | ICD-10-CM | POA: Diagnosis not present

## 2021-09-12 DIAGNOSIS — M545 Low back pain, unspecified: Secondary | ICD-10-CM | POA: Diagnosis not present

## 2021-09-13 DIAGNOSIS — M545 Low back pain, unspecified: Secondary | ICD-10-CM | POA: Diagnosis not present

## 2021-09-15 DIAGNOSIS — M545 Low back pain, unspecified: Secondary | ICD-10-CM | POA: Diagnosis not present

## 2021-09-16 DIAGNOSIS — M545 Low back pain, unspecified: Secondary | ICD-10-CM | POA: Diagnosis not present

## 2021-09-20 DIAGNOSIS — M545 Low back pain, unspecified: Secondary | ICD-10-CM | POA: Diagnosis not present

## 2021-09-22 DIAGNOSIS — M545 Low back pain, unspecified: Secondary | ICD-10-CM | POA: Diagnosis not present

## 2021-09-23 DIAGNOSIS — M545 Low back pain, unspecified: Secondary | ICD-10-CM | POA: Diagnosis not present

## 2021-09-27 DIAGNOSIS — M545 Low back pain, unspecified: Secondary | ICD-10-CM | POA: Diagnosis not present

## 2021-09-29 DIAGNOSIS — M545 Low back pain, unspecified: Secondary | ICD-10-CM | POA: Diagnosis not present

## 2021-10-07 DIAGNOSIS — L570 Actinic keratosis: Secondary | ICD-10-CM | POA: Diagnosis not present

## 2021-10-07 DIAGNOSIS — L2081 Atopic neurodermatitis: Secondary | ICD-10-CM | POA: Diagnosis not present

## 2021-10-07 DIAGNOSIS — L814 Other melanin hyperpigmentation: Secondary | ICD-10-CM | POA: Diagnosis not present

## 2021-10-07 DIAGNOSIS — Z85828 Personal history of other malignant neoplasm of skin: Secondary | ICD-10-CM | POA: Diagnosis not present

## 2021-10-07 DIAGNOSIS — L821 Other seborrheic keratosis: Secondary | ICD-10-CM | POA: Diagnosis not present

## 2021-10-07 DIAGNOSIS — L738 Other specified follicular disorders: Secondary | ICD-10-CM | POA: Diagnosis not present

## 2021-10-07 DIAGNOSIS — L820 Inflamed seborrheic keratosis: Secondary | ICD-10-CM | POA: Diagnosis not present

## 2021-10-07 DIAGNOSIS — L578 Other skin changes due to chronic exposure to nonionizing radiation: Secondary | ICD-10-CM | POA: Diagnosis not present

## 2021-10-25 DIAGNOSIS — M545 Low back pain, unspecified: Secondary | ICD-10-CM | POA: Diagnosis not present

## 2021-11-08 DIAGNOSIS — M545 Low back pain, unspecified: Secondary | ICD-10-CM | POA: Diagnosis not present

## 2021-12-20 DIAGNOSIS — M545 Low back pain, unspecified: Secondary | ICD-10-CM | POA: Diagnosis not present

## 2022-02-18 ENCOUNTER — Encounter: Payer: Self-pay | Admitting: Family Medicine

## 2022-02-18 ENCOUNTER — Ambulatory Visit (INDEPENDENT_AMBULATORY_CARE_PROVIDER_SITE_OTHER): Payer: PPO | Admitting: Family Medicine

## 2022-02-18 VITALS — BP 138/83 | HR 67 | Temp 98.0°F | Resp 16 | Wt 131.0 lb

## 2022-02-18 DIAGNOSIS — E7800 Pure hypercholesterolemia, unspecified: Secondary | ICD-10-CM

## 2022-02-18 DIAGNOSIS — I10 Essential (primary) hypertension: Secondary | ICD-10-CM | POA: Diagnosis not present

## 2022-02-18 DIAGNOSIS — R7303 Prediabetes: Secondary | ICD-10-CM

## 2022-02-18 DIAGNOSIS — R351 Nocturia: Secondary | ICD-10-CM

## 2022-02-18 DIAGNOSIS — R0683 Snoring: Secondary | ICD-10-CM | POA: Diagnosis not present

## 2022-02-18 MED ORDER — CETIRIZINE HCL 10 MG PO TABS: 10.0000 mg | ORAL_TABLET | Freq: Every day | ORAL | 3 refills | Status: DC

## 2022-02-18 MED ORDER — LISINOPRIL 40 MG PO TABS: 40.0000 mg | ORAL_TABLET | Freq: Every day | ORAL | 3 refills | Status: DC

## 2022-02-18 MED ORDER — ATORVASTATIN CALCIUM 20 MG PO TABS: 20.0000 mg | ORAL_TABLET | ORAL | 3 refills | Status: DC

## 2022-02-18 NOTE — Progress Notes (Signed)
I,Sulibeya S Dimas,acting as a Education administrator for Lavon Paganini, MD.,have documented all relevant documentation on the behalf of Lavon Paganini, MD,as directed by  Lavon Paganini, MD while in the presence of Lavon Paganini, MD.     Established patient visit   Patient: Cheryl Hays   DOB: 20-Sep-1945   76 y.o. Female  MRN: [id number] Visit Date: 02/18/2022  Today's healthcare provider: Lavon Paganini, MD   Chief Complaint  Patient presents with   Hypertension   Hyperlipidemia   Hyperglycemia   Subjective    HPI  Prediabetes, Follow-up  Lab Results  Component Value Date   HGBA1C 5.7 (H) 06/03/2021   HGBA1C 5.7 (H) 01/15/2021   HGBA1C 5.6 05/26/2020   GLUCOSE 97 06/03/2021   GLUCOSE 90 01/15/2021   GLUCOSE 97 05/26/2020    Last seen for for this8 months ago.  Management since that visit includes no changes. Current symptoms include none and have been stable.  Prior visit with dietician: no Current diet: in general, a "healthy" diet   Current exercise: walking  Pertinent Labs:    Component Value Date/Time   CHOL 165 06/03/2021 1006   TRIG 87 06/03/2021 1006   CHOLHDL 2.3 06/03/2021 1006   CHOLHDL 2.8 03/28/2017 0816   CREATININE 0.72 06/03/2021 1006   CREATININE 0.65 03/28/2017 0816    Wt Readings from Last 3 Encounters:  02/18/22 131 lb (59.4 kg)  07/19/21 131 lb 1.6 oz (59.5 kg)  05/28/21 134 lb 4.8 oz (60.9 kg)    -----------------------------------------------------------------------------------------  Hypertension, follow-up  BP Readings from Last 3 Encounters:  02/18/22 138/83  07/19/21 (!) 159/73  05/28/21 130/85   Wt Readings from Last 3 Encounters:  02/18/22 131 lb (59.4 kg)  07/19/21 131 lb 1.6 oz (59.5 kg)  05/28/21 134 lb 4.8 oz (60.9 kg)     She was last seen for hypertension 8 months ago.  BP at that visit was 130/85. Management since that visit includes no changes. She reports excellent compliance with treatment. She  is not having side effects.  She is exercising. She is adherent to low salt diet.   Outside blood pressures are elevated.  She does not smoke.  Use of agents associated with hypertension: none.   --------------------------------------------------------------------------------------------------- Lipid/Cholesterol, follow-up  Last Lipid Panel: Lab Results  Component Value Date   CHOL 165 06/03/2021   LDLCALC 76 06/03/2021   HDL 73 06/03/2021   TRIG 87 06/03/2021    She was last seen for this 8 months ago.  Management since that visit includes no changes.  She reports excellent compliance with treatment. She is not having side effects.   Symptoms: No appetite changes No foot ulcerations  No chest pain No chest pressure/discomfort  No dyspnea No orthopnea  No fatigue No lower extremity edema  No palpitations No paroxysmal nocturnal dyspnea  No nausea No numbness or tingling of extremity  No polydipsia No polyuria  No speech difficulty No syncope   She is following a Low Sodium diet. Current exercise: walking  Last metabolic panel Lab Results  Component Value Date   GLUCOSE 97 06/03/2021   NA 142 06/03/2021   K 4.7 06/03/2021   BUN 16 06/03/2021   CREATININE 0.72 06/03/2021   EGFR 86 06/03/2021   GFRNONAA 78 05/26/2020   CALCIUM 9.7 06/03/2021   AST 23 06/03/2021   ALT 18 06/03/2021   The 10-year ASCVD risk score (Arnett DK, et al., 2019) is: 29.8%  --------------------------------------------------------------------------------------------------- Had an accident in [place] nad had 2  fractured vertebrae in 06/2021 and was given valium, methadone, percocet, and celebrex  She is not taking any of this. Has seen Ortho and is doing better without pain now since doing PT  Knows that she snores, morning headaches. Has never had sleep study. Nocturia q2h.    Medications: Outpatient Medications Prior to Visit  Medication Sig   Calcium Carb-Cholecalciferol 600-800  MG-UNIT TABS Take 1 tablet by mouth daily. 1200-800   fluticasone (FLONASE) 50 MCG/ACT nasal spray Place 2 sprays into both nostrils daily. (Patient taking differently: Place 2 sprays into both nostrils as needed.)   [DISCONTINUED] atorvastatin (LIPITOR) 20 MG tablet Take 1 tablet (20 mg total) by mouth every other day.   [DISCONTINUED] cetirizine (ZYRTEC) 10 MG tablet Take 1 tablet (10 mg total) by mouth daily.   [DISCONTINUED] lisinopril (ZESTRIL) 20 MG tablet Take 1 tablet (20 mg total) by mouth daily.   [DISCONTINUED] meloxicam (MOBIC) 7.5 MG tablet Take 1 tablet (7.5 mg total) by mouth daily.   [DISCONTINUED] montelukast (SINGULAIR) 10 MG tablet Take 1 tablet (10 mg total) by mouth daily.   [DISCONTINUED] Nutritional Supplements (VITAL HIGH PROTEIN PO) Take by mouth.   [DISCONTINUED] omeprazole (PRILOSEC) 40 MG capsule Take 1 capsule (40 mg total) by mouth daily before breakfast.   No facility-administered medications prior to visit.    Review of Systems  Constitutional:  Negative for appetite change and fatigue.  Respiratory:  Negative for chest tightness and shortness of breath.   Cardiovascular:  Negative for chest pain, palpitations and leg swelling.  Gastrointestinal:  Negative for abdominal pain.       Objective    BP 138/83 (BP Location: Left Arm, Patient Position: Sitting, Cuff Size: Normal)   Pulse 67   Temp 98 F (36.7 C) (Oral)   Resp 16   Wt 131 lb (59.4 kg)   BMI 26.46 kg/m  BP Readings from Last 3 Encounters:  02/18/22 138/83  07/19/21 (!) 159/73  05/28/21 130/85   Wt Readings from Last 3 Encounters:  02/18/22 131 lb (59.4 kg)  07/19/21 131 lb 1.6 oz (59.5 kg)  05/28/21 134 lb 4.8 oz (60.9 kg)      Physical Exam Vitals reviewed.  Constitutional:      General: She is not in acute distress.    Appearance: Normal appearance. She is well-developed. She is not diaphoretic.  HENT:     Head: Normocephalic and atraumatic.  Eyes:     General: No scleral  icterus.    Conjunctiva/sclera: Conjunctivae normal.  Neck:     Thyroid: No thyromegaly.  Cardiovascular:     Rate and Rhythm: Normal rate and regular rhythm.     Pulses: Normal pulses.     Heart sounds: Normal heart sounds. No murmur heard. Pulmonary:     Effort: Pulmonary effort is normal. No respiratory distress.     Breath sounds: Normal breath sounds. No wheezing, rhonchi or rales.  Musculoskeletal:     Cervical back: Neck supple.     Right lower leg: No edema.     Left lower leg: No edema.  Lymphadenopathy:     Cervical: No cervical adenopathy.  Skin:    General: Skin is warm and dry.     Findings: No rash.  Neurological:     Mental Status: She is alert and oriented to person, place, and time. Mental status is at baseline.  Psychiatric:        Mood and Affect: Mood normal.  Behavior: Behavior normal.       No results found for any visits on 02/18/22.  Assessment & Plan     Problem List Items Addressed This Visit       Cardiovascular and Mediastinum   Essential hypertension - Primary    Well controlled Continue current medications Recheck metabolic panel F/u in 6 months       Relevant Medications   lisinopril (ZESTRIL) 40 MG tablet   atorvastatin (LIPITOR) 20 MG tablet   Other Relevant Orders   Comprehensive metabolic panel   Lipid Panel With LDL/HDL Ratio     Other   HLD (hyperlipidemia)    Previously well controlled Continue statin Repeat FLP and CMP      Relevant Medications   lisinopril (ZESTRIL) 40 MG tablet   atorvastatin (LIPITOR) 20 MG tablet   Other Relevant Orders   Comprehensive metabolic panel   Lipid Panel With LDL/HDL Ratio   Snoring    Newly reported problem Patient also with morning headaches and nonrestorative sleep and nocturia Constellation of symptoms is concerning for possible OSA We will get home sleep study Treatment pending results      Relevant Orders   Ambulatory referral to Sleep Studies   Prediabetes     Recommend low carb diet Recheck A1c       Relevant Orders   Hemoglobin A1c   Nocturia    May be related to untreated OSA as above Also discussed avoiding caffeine and beverages after dinner Also advised avoiding frequent urination during the day      Relevant Orders   Ambulatory referral to Sleep Studies     Return in about 6 months (around 08/20/2022) for CPE.      I, Lavon Paganini, MD, have reviewed all documentation for this visit. The documentation on 02/18/22 for the exam, diagnosis, procedures, and orders are all accurate and complete.   Jazzmyne Rasnick, Dionne Bucy, MD, MPH [facility]

## 2022-02-18 NOTE — Assessment & Plan Note (Signed)
Previously well controlled Continue statin Repeat FLP and CMP  

## 2022-02-18 NOTE — Assessment & Plan Note (Signed)
May be related to untreated OSA as above Also discussed avoiding caffeine and beverages after dinner Also advised avoiding frequent urination during the day

## 2022-02-18 NOTE — Assessment & Plan Note (Signed)
Well controlled Continue current medications Recheck metabolic panel F/u in 6 months  

## 2022-02-18 NOTE — Assessment & Plan Note (Signed)
Newly reported problem Patient also with morning headaches and nonrestorative sleep and nocturia Constellation of symptoms is concerning for possible OSA We will get home sleep study Treatment pending results

## 2022-02-18 NOTE — Assessment & Plan Note (Signed)
Recommend low carb diet °Recheck A1c  °

## 2022-02-19 LAB — COMPREHENSIVE METABOLIC PANEL
ALT: 52 IU/L — ABNORMAL HIGH (ref 0–32)
AST: 40 IU/L (ref 0–40)
Albumin/Globulin Ratio: 2 (ref 1.2–2.2)
Albumin: 4.5 g/dL (ref 3.8–4.8)
Alkaline Phosphatase: 101 IU/L (ref 44–121)
BUN/Creatinine Ratio: 22 (ref 12–28)
BUN: 16 mg/dL (ref 8–27)
Bilirubin Total: 0.5 mg/dL (ref 0.0–1.2)
CO2: 25 mmol/L (ref 20–29)
Calcium: 9.6 mg/dL (ref 8.7–10.3)
Chloride: 103 mmol/L (ref 96–106)
Creatinine, Ser: 0.72 mg/dL (ref 0.57–1.00)
Globulin, Total: 2.3 g/dL (ref 1.5–4.5)
Glucose: 110 mg/dL — ABNORMAL HIGH (ref 70–99)
Potassium: 4.2 mmol/L (ref 3.5–5.2)
Sodium: 142 mmol/L (ref 134–144)
Total Protein: 6.8 g/dL (ref 6.0–8.5)
eGFR: 86 mL/min/{1.73_m2} (ref >59)

## 2022-02-19 LAB — LIPID PANEL WITH LDL/HDL RATIO
Cholesterol, Total: 170 mg/dL (ref 100–199)
HDL: 70 mg/dL (ref >39)
LDL Chol Calc (NIH): 83 mg/dL (ref 0–99)
LDL/HDL Ratio: 1.2 ratio (ref 0.0–3.2)
Triglycerides: 93 mg/dL (ref 0–149)
VLDL Cholesterol Cal: 17 mg/dL (ref 5–40)

## 2022-02-19 LAB — HEMOGLOBIN A1C
Est. average glucose Bld gHb Est-mCnc: 117 mg/dL
Hgb A1c MFr Bld: 5.7 % — ABNORMAL HIGH (ref 4.8–5.6)

## 2022-02-24 DIAGNOSIS — H43813 Vitreous degeneration, bilateral: Secondary | ICD-10-CM | POA: Diagnosis not present

## 2022-03-08 DIAGNOSIS — G4730 Sleep apnea, unspecified: Secondary | ICD-10-CM | POA: Diagnosis not present

## 2022-03-21 ENCOUNTER — Other Ambulatory Visit: Payer: Self-pay | Admitting: Family Medicine

## 2022-03-21 DIAGNOSIS — Z1231 Encounter for screening mammogram for malignant neoplasm of breast: Secondary | ICD-10-CM

## 2022-03-22 ENCOUNTER — Telehealth: Payer: Self-pay | Admitting: Family Medicine

## 2022-03-22 NOTE — Telephone Encounter (Signed)
Results of home sleep study reviewed.  Home sleep study was completed.  On 03/08/2022.  AHI 16.2/h 87 desaturations with lowest desaturation of 82%.  This is consistent with moderate obstructive sleep apnea.  Patient would benefit from CPAP therapy.  I am ordering her CPAP and supplies through [facility].

## 2022-03-23 ENCOUNTER — Encounter: Payer: Self-pay | Admitting: Family Medicine

## 2022-03-23 NOTE — Telephone Encounter (Signed)
Spoke with patient and advised. She said she will call the pharmacy and [facility] to find out her exact results and see where to pick up the CPAP and how to clean and wear it.

## 2022-03-24 NOTE — Telephone Encounter (Signed)
See phone note from 11/7. It has the results

## 2022-03-28 NOTE — Telephone Encounter (Signed)
The documentation from 11/7 phone note has the actual AHI (number of times she stopped breathing or had decreased oxygen levels per hour). Does she want to do a virtual visit to walk through it and CPAP options?

## 2022-04-21 DIAGNOSIS — L570 Actinic keratosis: Secondary | ICD-10-CM | POA: Diagnosis not present

## 2022-04-21 DIAGNOSIS — L578 Other skin changes due to chronic exposure to nonionizing radiation: Secondary | ICD-10-CM | POA: Diagnosis not present

## 2022-04-21 DIAGNOSIS — L820 Inflamed seborrheic keratosis: Secondary | ICD-10-CM | POA: Diagnosis not present

## 2022-04-21 DIAGNOSIS — Z85828 Personal history of other malignant neoplasm of skin: Secondary | ICD-10-CM | POA: Diagnosis not present

## 2022-06-01 ENCOUNTER — Ambulatory Visit
Admission: RE | Admit: 2022-06-01 | Discharge: 2022-06-01 | Disposition: A | Discharge status: Home / Self Care | Payer: PPO | Source: Ambulatory Visit | Attending: Family Medicine | Admitting: Family Medicine

## 2022-06-01 DIAGNOSIS — Z1231 Encounter for screening mammogram for malignant neoplasm of breast: Secondary | ICD-10-CM | POA: Diagnosis not present

## 2022-06-03 DIAGNOSIS — G4733 Obstructive sleep apnea (adult) (pediatric): Secondary | ICD-10-CM | POA: Diagnosis not present

## 2022-06-13 ENCOUNTER — Encounter: Payer: Self-pay | Admitting: Family Medicine

## 2022-06-21 DIAGNOSIS — H2511 Age-related nuclear cataract, right eye: Secondary | ICD-10-CM | POA: Diagnosis not present

## 2022-06-21 DIAGNOSIS — H2512 Age-related nuclear cataract, left eye: Secondary | ICD-10-CM | POA: Diagnosis not present

## 2022-07-06 ENCOUNTER — Encounter: Payer: Self-pay | Admitting: Ophthalmology

## 2022-07-07 NOTE — Discharge Instructions (Signed)

## 2022-07-11 ENCOUNTER — Ambulatory Visit
Admission: RE | Admit: 2022-07-11 | Discharge: 2022-07-11 | Disposition: A | Discharge status: Home / Self Care | Payer: PPO | Attending: Ophthalmology | Admitting: Ophthalmology

## 2022-07-11 ENCOUNTER — Other Ambulatory Visit: Payer: Self-pay

## 2022-07-11 ENCOUNTER — Ambulatory Visit: Payer: PPO | Admitting: Anesthesiology

## 2022-07-11 ENCOUNTER — Encounter: Payer: Self-pay | Admitting: Ophthalmology

## 2022-07-11 ENCOUNTER — Encounter
Admission: RE | Disposition: A | Discharge status: Home / Self Care | Payer: Self-pay | Source: Home / Self Care | Attending: Ophthalmology

## 2022-07-11 DIAGNOSIS — E119 Type 2 diabetes mellitus without complications: Secondary | ICD-10-CM | POA: Diagnosis not present

## 2022-07-11 DIAGNOSIS — J45909 Unspecified asthma, uncomplicated: Secondary | ICD-10-CM | POA: Insufficient documentation

## 2022-07-11 DIAGNOSIS — K449 Diaphragmatic hernia without obstruction or gangrene: Secondary | ICD-10-CM | POA: Insufficient documentation

## 2022-07-11 DIAGNOSIS — K219 Gastro-esophageal reflux disease without esophagitis: Secondary | ICD-10-CM | POA: Insufficient documentation

## 2022-07-11 DIAGNOSIS — H2511 Age-related nuclear cataract, right eye: Secondary | ICD-10-CM | POA: Insufficient documentation

## 2022-07-11 DIAGNOSIS — I10 Essential (primary) hypertension: Secondary | ICD-10-CM | POA: Diagnosis not present

## 2022-07-11 DIAGNOSIS — Z79899 Other long term (current) drug therapy: Secondary | ICD-10-CM | POA: Insufficient documentation

## 2022-07-11 DIAGNOSIS — Z87891 Personal history of nicotine dependence: Secondary | ICD-10-CM | POA: Insufficient documentation

## 2022-07-11 HISTORY — DX: Scoliosis, unspecified: M41.9

## 2022-07-11 HISTORY — DX: Essential (primary) hypertension: I10

## 2022-07-11 HISTORY — PX: CATARACT EXTRACTION W/PHACO: SHX586

## 2022-07-11 SURGERY — PHACOEMULSIFICATION, CATARACT, WITH IOL INSERTION
Anesthesia: Monitor Anesthesia Care | Site: Eye | Laterality: Right

## 2022-07-11 MED ORDER — TETRACAINE HCL 0.5 % OP SOLN
1.0000 [drp] | OPHTHALMIC | Status: DC | PRN
  Administered 2022-07-11 (×3): 1 [drp] via OPHTHALMIC

## 2022-07-11 MED ORDER — SIGHTPATH DOSE#1 BSS IO SOLN
INTRAOCULAR | Status: DC | PRN
  Administered 2022-07-11: 15 mL

## 2022-07-11 MED ORDER — LIDOCAINE HCL (PF) 2 % IJ SOLN
INTRAOCULAR | Status: DC | PRN
  Administered 2022-07-11: 1 mL via INTRAOCULAR

## 2022-07-11 MED ORDER — FENTANYL CITRATE (PF) 100 MCG/2ML IJ SOLN
INTRAMUSCULAR | Status: DC | PRN
  Administered 2022-07-11: 50 ug via INTRAVENOUS

## 2022-07-11 MED ORDER — MIDAZOLAM HCL 2 MG/2ML IJ SOLN
INTRAMUSCULAR | Status: DC | PRN
  Administered 2022-07-11: 1 mg via INTRAVENOUS

## 2022-07-11 MED ORDER — CEFUROXIME OPHTHALMIC INJECTION 1 MG/0.1 ML
INJECTION | OPHTHALMIC | Status: DC | PRN
  Administered 2022-07-11: .1 mL via INTRACAMERAL

## 2022-07-11 MED ORDER — SIGHTPATH DOSE#1 NA HYALUR & NA CHOND-NA HYALUR IO KIT
PACK | INTRAOCULAR | Status: DC | PRN
  Administered 2022-07-11: 1 via OPHTHALMIC

## 2022-07-11 MED ORDER — SIGHTPATH DOSE#1 BSS IO SOLN
INTRAOCULAR | Status: DC | PRN
  Administered 2022-07-11: 109 mL via OPHTHALMIC

## 2022-07-11 MED ORDER — ARMC OPHTHALMIC DILATING DROPS
1.0000 | OPHTHALMIC | Status: DC | PRN
  Administered 2022-07-11 (×3): 1 via OPHTHALMIC

## 2022-07-11 SURGICAL SUPPLY — 14 items
CATARACT SUITE SIGHTPATH (MISCELLANEOUS) ×1 IMPLANT
DISSECTOR HYDRO NUCLEUS 50X22 (MISCELLANEOUS) ×1 IMPLANT
FEE CATARACT SUITE SIGHTPATH (MISCELLANEOUS) ×1 IMPLANT
GLOVE SURG GAMMEX PI TX LF 7.5 (GLOVE) ×1 IMPLANT
GLOVE SURG SYN 8.5  E (GLOVE) ×1
GLOVE SURG SYN 8.5 E (GLOVE) ×1 IMPLANT
GLOVE SURG SYN 8.5 PF PI (GLOVE) ×1 IMPLANT
LENS CLAREON PAN OPTIX 205 ×1 IMPLANT
LENS IOL CLRN PAN 20.5 IMPLANT
NDL FILTER BLUNT 18X1 1/2 (NEEDLE) ×1 IMPLANT
NEEDLE FILTER BLUNT 18X1 1/2 (NEEDLE) ×1 IMPLANT
SYR 3ML LL SCALE MARK (SYRINGE) ×1 IMPLANT
SYR 5ML LL (SYRINGE) ×1 IMPLANT
WATER STERILE IRR 250ML POUR (IV SOLUTION) ×1 IMPLANT

## 2022-07-11 NOTE — H&P (Signed)
[facility]   Primary Care Physician:  Virginia Crews, MD Ophthalmologist: Dr. Benay Pillow  Pre-Procedure History & Physical: HPI:  Cheryl Hays is a 77 y.o. female here for cataract surgery.   Past Medical History:  Diagnosis Date   Abnormal LFTs 10/31/2014   Asthma    Dyspnea    with colds   GERD (gastroesophageal reflux disease)    Hyperlipidemia    Hypertension    Pneumonia    Scoliosis     Past Surgical History:  Procedure Laterality Date   BREAST SURGERY Left 2000   biopsy   CESAREAN SECTION  05/08/1978, 07/03/1979   DILATION AND CURETTAGE OF UTERUS  1993   ESOPHAGOGASTRODUODENOSCOPY N/A 04/15/2021   Procedure: ESOPHAGOGASTRODUODENOSCOPY (EGD);  Surgeon: Lin Landsman, MD;  Location: [facility] ENDOSCOPY;  Service: Gastroenterology;  Laterality: N/A;   FACIAL COSMETIC SURGERY  2006   SKIN SURGERY Right    hand 02/2016 x's 2 Dr. Nehemiah Massed   WRIST FRACTURE SURGERY Right     Prior to Admission medications   Medication Sig Start Date End Date Taking? Authorizing Provider  atorvastatin (LIPITOR) 20 MG tablet Take 1 tablet (20 mg total) by mouth every other day. 02/18/22  Yes Bacigalupo, Dionne Bucy, MD  budesonide-formoterol [facility]) 80-4.5 MCG/ACT inhaler Inhale 2 puffs into the lungs 2 (two) times daily as needed.   Yes [provider]  Calcium Carb-Cholecalciferol 600-800 MG-UNIT TABS Take 1 tablet by mouth daily. 1200-800   Yes [provider]  cetirizine (ZYRTEC) 10 MG tablet Take 1 tablet (10 mg total) by mouth daily. 02/18/22  Yes Bacigalupo, Dionne Bucy, MD  collagenase (SANTYL) 250 UNIT/GM ointment Apply 1 Application topically daily.   Yes [provider]  fluticasone (FLONASE) 50 MCG/ACT nasal spray Place 2 sprays into both nostrils daily. Patient taking differently: Place 2 sprays into both nostrils as needed. 06/28/19  Yes Bacigalupo, Dionne Bucy, MD  lisinopril (ZESTRIL) 40 MG tablet Take 1 tablet (40 mg total) by mouth  daily. 02/18/22  Yes Virginia Crews, MD    Allergies as of 03/07/2022 - Review Complete 02/18/2022  Allergen Reaction Noted   Celecoxib  10/31/2014   Levofloxacin Other (See Comments) 03/23/2016   Sulfa antibiotics  10/31/2014   Pseudoephedrine Rash 10/31/2014    Family History  Problem Relation Age of Onset   Heart murmur Mother    Vision loss Father    Bladder Cancer Brother    Breast cancer Neg Hx     Social History   Socioeconomic History   Marital status: Widowed    Spouse name: Not on file   Number of children: 2   Years of education: 14   Highest education level: Some college, no degree  Occupational History   Occupation: retired  Tobacco Use   Smoking status: Former    Types: Cigarettes    Quit date: 05/16/1975    Years since quitting: 47.1   Smokeless tobacco: Never  Vaping Use   Vaping Use: Never used  Substance and Sexual Activity   Alcohol use: Yes    Comment: occassionally   Drug use: No   Sexual activity: Not on file  Other Topics Concern   Not on file  Social History Narrative   Not on file   Social Determinants of Health   Financial Resource Strain: [place]  (03/28/2018)   Overall Financial Resource Strain (CARDIA)    Difficulty of Paying Living Expenses: Not hard at all  Food Insecurity: No [place] (  03/28/2018)   Hunger Vital Sign    Worried About Running Out of Food in the Last Year: Never true    [place] in the Last Year: Never true  Transportation Needs: No Transportation Needs (03/28/2018)   PRAPARE - Hydrologist (Medical): No    Lack of Transportation (Non-Medical): No  Physical Activity: Inactive (03/28/2018)   Exercise Vital Sign    Days of Exercise per Week: 0 days    Minutes of Exercise per Session: 0 min  Stress: No Stress Concern Present (03/28/2018)   [place]    Feeling of Stress : Not at all  Social  Connections: Unknown (03/28/2018)   Social Connection and Isolation Panel [NHANES]    Frequency of Communication with Friends and Family: Patient refused    Frequency of Social Gatherings with Friends and Family: Patient refused    Attends Religious Services: Patient refused    Active Member of Clubs or Organizations: Patient refused    Attends Archivist Meetings: Patient refused    Marital Status: Patient refused  Intimate Partner Violence: Unknown (03/28/2018)   Humiliation, Afraid, Rape, and Kick questionnaire    Fear of Current or Ex-Partner: Patient refused    Emotionally Abused: Patient refused    Physically Abused: Patient refused    Sexually Abused: Patient refused    Review of Systems: See HPI, otherwise negative ROS  Physical Exam: BP (!) 161/78   Pulse 90   Temp 97.9 F (36.6 C) (Temporal)   Ht 5' (1.524 m)   Wt 59.4 kg   SpO2 99%   BMI 25.58 kg/m  General:   Alert, cooperative in NAD Head:  Normocephalic and atraumatic. Respiratory:  Normal work of breathing. Cardiovascular:  RRR  Impression/Plan: [place] is here for cataract surgery.  Risks, benefits, limitations, and alternatives regarding cataract surgery have been reviewed with the patient.  Questions have been answered.  All parties agreeable.   Benay Pillow, MD  07/11/2022, 8:45 AM

## 2022-07-11 NOTE — Transfer of Care (Signed)
Immediate Anesthesia Transfer of Care Note  Patient: Cheryl Hays  Procedure(s) Performed: CATARACT EXTRACTION PHACO AND INTRAOCULAR LENS PLACEMENT (IOC) RIGHT CLAREON PANOPTIX  9.86  00:55.8 (Right: Eye)  Patient Location: PACU  Anesthesia Type: No value filed.  Level of Consciousness: awake, alert  and patient cooperative  Airway and Oxygen Therapy: Patient Spontanous Breathing and Patient connected to supplemental oxygen  Post-op Assessment: Post-op Vital signs reviewed, Patient's Cardiovascular Status Stable, Respiratory Function Stable, Patent Airway and No signs of Nausea or vomiting  Post-op Vital Signs: Reviewed and stable  Complications: No notable events documented.

## 2022-07-11 NOTE — Op Note (Addendum)
OPERATIVE NOTE  Cheryl Hays [id number] 07/11/2022   PREOPERATIVE DIAGNOSIS:  Nuclear sclerotic cataract right eye.  H25.11   POSTOPERATIVE DIAGNOSIS:    Nuclear sclerotic cataract right eye.     PROCEDURE:  Phacoemusification with posterior chamber intraocular lens placement of the right eye   LENS:   Implant Name Type Inv. Item Serial No. Manufacturer Lot No. LRB No. Used Action  LENS CLAREON PAN OPTIX 205 - [id number]  LENS CLAREON PAN OPTIX 205 [id number] [employer]  Right 1 Implanted       Procedure(s): CATARACT EXTRACTION PHACO AND INTRAOCULAR LENS PLACEMENT (IOC) RIGHT CLAREON PANOPTIX  9.86  00:55.8 (Right)  [id number] +20.5   ULTRASOUND TIME: 0 minutes 55.8 seconds.  CDE 9.86   SURGEON:  Benay Pillow, MD, MPH  ANESTHESIOLOGIST: Anesthesiologist: Molli Barrows, MD CRNA: Moises Blood, CRNA   ANESTHESIA:  Topical with tetracaine drops augmented with 1% preservative-free intracameral lidocaine.  ESTIMATED BLOOD LOSS: less than 1 mL.   COMPLICATIONS:  None.   DESCRIPTION OF PROCEDURE:  The patient was identified in the holding room and transported to the operating room and placed in the supine position under the operating microscope.  The right eye was identified as the operative eye and it was prepped and draped in the usual sterile ophthalmic fashion.   A 1.0 millimeter clear-corneal paracentesis was made at the 10:30 position. 0.5 ml of preservative-free 1% lidocaine with epinephrine was injected into the anterior chamber.  The anterior chamber was filled with viscoelastic.  A 2.4 millimeter keratome was used to make a near-clear corneal incision at the 8:00 position.  A curvilinear capsulorrhexis was made with a cystotome and capsulorrhexis forceps.  Balanced salt solution was used to hydrodissect and hydrodelineate the nucleus.   Phacoemulsification was then used in stop and chop fashion to remove the lens nucleus and epinucleus.  The remaining cortex was then  removed using the irrigation and aspiration handpiece. Viscoelastic was then placed into the capsular bag to distend it for lens placement.  A lens was then injected into the capsular bag.  The remaining viscoelastic was aspirated.   Wounds were hydrated with balanced salt solution.  The anterior chamber was inflated to a physiologic pressure with balanced salt solution.   Intracameral cefuroxime 0.1 mL at 10 mg/mL was injected into the eye.  No wound leaks were noted.  The patient was taken to the recovery room in stable condition without complications of anesthesia or surgery  Benay Pillow 07/11/2022, 9:16 AM

## 2022-07-11 NOTE — Anesthesia Postprocedure Evaluation (Signed)
Anesthesia Post Note  Patient: Cheryl Hays  Procedure(s) Performed: CATARACT EXTRACTION PHACO AND INTRAOCULAR LENS PLACEMENT (IOC) RIGHT CLAREON PANOPTIX  9.86  00:55.8 (Right: Eye)  Patient location during evaluation: PACU Anesthesia Type: MAC Level of consciousness: awake and alert Pain management: pain level controlled Vital Signs Assessment: post-procedure vital signs reviewed and stable Respiratory status: spontaneous breathing, nonlabored ventilation, respiratory function stable and patient connected to nasal cannula oxygen Cardiovascular status: stable and blood pressure returned to baseline Postop Assessment: no apparent nausea or vomiting Anesthetic complications: no   No notable events documented.   Last Vitals:  Vitals:   07/11/22 0918 07/11/22 0924  BP: 129/70 (!) 141/69  Pulse: 68 65  Resp: 18 15  Temp: 36.6 C 36.5 C  SpO2: 99% 100%    Last Pain:  Vitals:   07/11/22 0924  TempSrc:   PainSc: 0-No pain                 Molli Barrows

## 2022-07-11 NOTE — Anesthesia Preprocedure Evaluation (Signed)
Anesthesia Evaluation  Patient identified by MRN, date of birth, ID band Patient awake    Reviewed: Allergy & Precautions, H&P , NPO status , Patient's Chart, lab work & pertinent test results, reviewed documented beta blocker date and time   Airway Mallampati: II  TM Distance: >3 FB Neck ROM: full    Dental no notable dental hx. (+) Teeth Intact   Pulmonary shortness of breath and with exertion, asthma , pneumonia, resolved, former smoker   Pulmonary exam normal breath sounds clear to auscultation       Cardiovascular Exercise Tolerance: Good hypertension, On Medications negative cardio ROS  Rhythm:regular Rate:Normal     Neuro/Psych  Neuromuscular disease  negative psych ROS   GI/Hepatic Neg liver ROS, hiatal hernia, PUD,GERD  Medicated,,  Endo/Other  negative endocrine ROSdiabetes    Renal/GU      Musculoskeletal   Abdominal   Peds  Hematology negative hematology ROS (+)   Anesthesia Other Findings   Reproductive/Obstetrics negative OB ROS                             Anesthesia Physical Anesthesia Plan  ASA: 3  Anesthesia Plan: MAC   Post-op Pain Management:    Induction:   PONV Risk Score and Plan:   Airway Management Planned:   Additional Equipment:   Intra-op Plan:   Post-operative Plan:   Informed Consent: I have reviewed the patients History and Physical, chart, labs and discussed the procedure including the risks, benefits and alternatives for the proposed anesthesia with the patient or authorized representative who has indicated his/her understanding and acceptance.       Plan Discussed with: CRNA  Anesthesia Plan Comments:        Anesthesia Quick Evaluation

## 2022-07-12 ENCOUNTER — Encounter: Payer: Self-pay | Admitting: Ophthalmology

## 2022-07-12 ENCOUNTER — Other Ambulatory Visit: Payer: Self-pay

## 2022-07-12 DIAGNOSIS — H2512 Age-related nuclear cataract, left eye: Secondary | ICD-10-CM | POA: Diagnosis not present

## 2022-07-13 DIAGNOSIS — H1013 Acute atopic conjunctivitis, bilateral: Secondary | ICD-10-CM | POA: Diagnosis not present

## 2022-07-21 NOTE — Discharge Instructions (Signed)

## 2022-07-25 ENCOUNTER — Other Ambulatory Visit: Payer: Self-pay

## 2022-07-25 ENCOUNTER — Ambulatory Visit
Admission: RE | Admit: 2022-07-25 | Discharge: 2022-07-25 | Disposition: A | Discharge status: Home / Self Care | Payer: PPO | Attending: Ophthalmology | Admitting: Ophthalmology

## 2022-07-25 ENCOUNTER — Ambulatory Visit: Payer: PPO | Admitting: General Practice

## 2022-07-25 ENCOUNTER — Encounter
Admission: RE | Disposition: A | Discharge status: Home / Self Care | Payer: Self-pay | Source: Home / Self Care | Attending: Ophthalmology

## 2022-07-25 DIAGNOSIS — Z87891 Personal history of nicotine dependence: Secondary | ICD-10-CM | POA: Diagnosis not present

## 2022-07-25 DIAGNOSIS — H2512 Age-related nuclear cataract, left eye: Secondary | ICD-10-CM | POA: Diagnosis not present

## 2022-07-25 DIAGNOSIS — I10 Essential (primary) hypertension: Secondary | ICD-10-CM | POA: Insufficient documentation

## 2022-07-25 DIAGNOSIS — Z8711 Personal history of peptic ulcer disease: Secondary | ICD-10-CM | POA: Insufficient documentation

## 2022-07-25 DIAGNOSIS — H269 Unspecified cataract: Secondary | ICD-10-CM | POA: Diagnosis not present

## 2022-07-25 HISTORY — PX: CATARACT EXTRACTION W/PHACO: SHX586

## 2022-07-25 SURGERY — PHACOEMULSIFICATION, CATARACT, WITH IOL INSERTION
Anesthesia: Monitor Anesthesia Care | Site: Eye | Laterality: Left

## 2022-07-25 MED ORDER — MIDAZOLAM HCL 2 MG/2ML IJ SOLN
INTRAMUSCULAR | Status: DC | PRN
  Administered 2022-07-25: 1 mg via INTRAVENOUS

## 2022-07-25 MED ORDER — ARMC OPHTHALMIC DILATING DROPS
1.0000 | OPHTHALMIC | Status: DC | PRN
  Administered 2022-07-25 (×3): 1 via OPHTHALMIC

## 2022-07-25 MED ORDER — CEFUROXIME OPHTHALMIC INJECTION 1 MG/0.1 ML
INJECTION | OPHTHALMIC | Status: DC | PRN
  Administered 2022-07-25: .1 mL via INTRACAMERAL

## 2022-07-25 MED ORDER — FENTANYL CITRATE (PF) 100 MCG/2ML IJ SOLN
INTRAMUSCULAR | Status: DC | PRN
  Administered 2022-07-25: 50 ug via INTRAVENOUS

## 2022-07-25 MED ORDER — LACTATED RINGERS IV SOLN: INTRAVENOUS | Status: DC

## 2022-07-25 MED ORDER — SIGHTPATH DOSE#1 NA HYALUR & NA CHOND-NA HYALUR IO KIT
PACK | INTRAOCULAR | Status: DC | PRN
  Administered 2022-07-25: 1 via OPHTHALMIC

## 2022-07-25 MED ORDER — LIDOCAINE HCL (PF) 2 % IJ SOLN
INTRAOCULAR | Status: DC | PRN
  Administered 2022-07-25: 1 mL via INTRAOCULAR

## 2022-07-25 MED ORDER — TETRACAINE HCL 0.5 % OP SOLN
1.0000 [drp] | OPHTHALMIC | Status: DC | PRN
  Administered 2022-07-25 (×3): 1 [drp] via OPHTHALMIC

## 2022-07-25 MED ORDER — SIGHTPATH DOSE#1 BSS IO SOLN
INTRAOCULAR | Status: DC | PRN
  Administered 2022-07-25: 15 mL

## 2022-07-25 MED ORDER — SIGHTPATH DOSE#1 BSS IO SOLN
INTRAOCULAR | Status: DC | PRN
  Administered 2022-07-25: 90 mL via OPHTHALMIC

## 2022-07-25 SURGICAL SUPPLY — 14 items
CATARACT SUITE SIGHTPATH (MISCELLANEOUS) ×1 IMPLANT
DISSECTOR HYDRO NUCLEUS 50X22 (MISCELLANEOUS) ×1 IMPLANT
FEE CATARACT SUITE SIGHTPATH (MISCELLANEOUS) ×1 IMPLANT
GLOVE SURG GAMMEX PI TX LF 7.5 (GLOVE) ×1 IMPLANT
GLOVE SURG SYN 8.5  E (GLOVE) ×1
GLOVE SURG SYN 8.5 E (GLOVE) ×1 IMPLANT
GLOVE SURG SYN 8.5 PF PI (GLOVE) ×1 IMPLANT
LENS CLAREON PAN OPTIX 205 ×1 IMPLANT
LENS IOL CLRN PAN 20.5 IMPLANT
NDL FILTER BLUNT 18X1 1/2 (NEEDLE) ×1 IMPLANT
NEEDLE FILTER BLUNT 18X1 1/2 (NEEDLE) ×1 IMPLANT
SYR 3ML LL SCALE MARK (SYRINGE) ×1 IMPLANT
SYR 5ML LL (SYRINGE) ×1 IMPLANT
WATER STERILE IRR 250ML POUR (IV SOLUTION) ×1 IMPLANT

## 2022-07-25 NOTE — Anesthesia Postprocedure Evaluation (Signed)
Anesthesia Post Note  Patient: Cheryl Hays  Procedure(s) Performed: CATARACT EXTRACTION PHACO AND INTRAOCULAR LENS PLACEMENT (IOC) LEFT CLAREON PANOPTIX  7.86  00:40.7 (Left: Eye)  Patient location during evaluation: PACU Anesthesia Type: MAC Level of consciousness: awake and alert Pain management: pain level controlled Vital Signs Assessment: post-procedure vital signs reviewed and stable Respiratory status: spontaneous breathing, nonlabored ventilation and respiratory function stable Cardiovascular status: blood pressure returned to baseline and stable Postop Assessment: no apparent nausea or vomiting Anesthetic complications: no   No notable events documented.   Last Vitals:  Vitals:   07/25/22 1216 07/25/22 1221  BP: (!) 155/68 (!) 155/80  Pulse: 65 65  Resp: 18 15  Temp: (!) 36.4 C 36.4 C  SpO2: 100% 100%    Last Pain:  Vitals:   07/25/22 1221  TempSrc:   PainSc: 0-No pain                 [place] Ouch

## 2022-07-25 NOTE — H&P (Signed)
[facility]   Primary Care Physician:  Virginia Crews, MD Ophthalmologist: Dr. Benay Pillow  Pre-Procedure History & Physical: HPI:  Cheryl Hays is a 77 y.o. female here for cataract surgery.   Past Medical History:  Diagnosis Date   Abnormal LFTs 10/31/2014   Asthma    Dyspnea    with colds   GERD (gastroesophageal reflux disease)    Hyperlipidemia    Hypertension    Pneumonia    Scoliosis     Past Surgical History:  Procedure Laterality Date   BREAST SURGERY Left 2000   biopsy   CATARACT EXTRACTION W/PHACO Right 07/11/2022   Procedure: CATARACT EXTRACTION PHACO AND INTRAOCULAR LENS PLACEMENT ([place]) RIGHT CLAREON PANOPTIX  9.86  00:55.8;  Surgeon: Eulogio Bear, MD;  Location: [facility];  Service: Ophthalmology;  Laterality: Right;   CESAREAN SECTION  05/08/1978, 07/03/1979   DILATION AND CURETTAGE OF UTERUS  1993   ESOPHAGOGASTRODUODENOSCOPY N/A 04/15/2021   Procedure: ESOPHAGOGASTRODUODENOSCOPY (EGD);  Surgeon: Lin Landsman, MD;  Location: [facility] ENDOSCOPY;  Service: Gastroenterology;  Laterality: N/A;   FACIAL COSMETIC SURGERY  2006   SKIN SURGERY Right    hand 02/2016 x's 2 Dr. Nehemiah Massed   WRIST FRACTURE SURGERY Right     Prior to Admission medications   Medication Sig Start Date End Date Taking? Authorizing Provider  atorvastatin (LIPITOR) 20 MG tablet Take 1 tablet (20 mg total) by mouth every other day. 02/18/22  Yes Bacigalupo, Dionne Bucy, MD  budesonide-formoterol [facility]) 80-4.5 MCG/ACT inhaler Inhale 2 puffs into the lungs 2 (two) times daily as needed.   Yes [provider]  Calcium Carb-Cholecalciferol 600-800 MG-UNIT TABS Take 1 tablet by mouth daily. 1200-800   Yes [provider]  cetirizine (ZYRTEC) 10 MG tablet Take 1 tablet (10 mg total) by mouth daily. 02/18/22  Yes Bacigalupo, Dionne Bucy, MD  collagenase (SANTYL) 250 UNIT/GM ointment Apply 1 Application topically daily.   Yes [provider]   fluticasone (FLONASE) 50 MCG/ACT nasal spray Place 2 sprays into both nostrils daily. Patient taking differently: Place 2 sprays into both nostrils as needed. 06/28/19  Yes Bacigalupo, Dionne Bucy, MD  lisinopril (ZESTRIL) 40 MG tablet Take 1 tablet (40 mg total) by mouth daily. 02/18/22  Yes Virginia Crews, MD    Allergies as of 03/07/2022 - Review Complete 02/18/2022  Allergen Reaction Noted   Celecoxib  10/31/2014   Levofloxacin Other (See Comments) 03/23/2016   Sulfa antibiotics  10/31/2014   Pseudoephedrine Rash 10/31/2014    Family History  Problem Relation Age of Onset   Heart murmur Mother    Vision loss Father    Bladder Cancer Brother    Breast cancer Neg Hx     Social History   Socioeconomic History   Marital status: Widowed    Spouse name: Not on file   Number of children: 2   Years of education: 14   Highest education level: Some college, no degree  Occupational History   Occupation: retired  Tobacco Use   Smoking status: Former    Types: Cigarettes    Quit date: 05/16/1975    Years since quitting: 47.2   Smokeless tobacco: Never  Vaping Use   Vaping Use: Never used  Substance and Sexual Activity   Alcohol use: Yes    Comment: occassionally   Drug use: No   Sexual activity: Not on file  Other Topics Concern   Not on file  Social History Narrative   Not  on file   Social Determinants of Health   Financial Resource Strain: Low Risk  (03/28/2018)   Overall Financial Resource Strain (CARDIA)    Difficulty of Paying Living Expenses: Not hard at all  Food Insecurity: No Food Insecurity (03/28/2018)   Hunger Vital Sign    Worried About Running Out of Food in the Last Year: Never true    Ran Out of Food in the Last Year: Never true  Transportation Needs: No Transportation Needs (03/28/2018)   PRAPARE - Hydrologist (Medical): No    Lack of Transportation (Non-Medical): No  Physical Activity: Inactive (03/28/2018)    Exercise Vital Sign    Days of Exercise per Week: 0 days    Minutes of Exercise per Session: 0 min  Stress: No Stress Concern Present (03/28/2018)   [place]    Feeling of Stress : Not at all  Social Connections: Unknown (03/28/2018)   Social Connection and Isolation Panel [NHANES]    Frequency of Communication with Friends and Family: Patient refused    Frequency of Social Gatherings with Friends and Family: Patient refused    Attends Religious Services: Patient refused    Active Member of Clubs or Organizations: Patient refused    Attends Archivist Meetings: Patient refused    Marital Status: Patient refused  Intimate Partner Violence: Unknown (03/28/2018)   Humiliation, Afraid, Rape, and Kick questionnaire    Fear of Current or Ex-Partner: Patient refused    Emotionally Abused: Patient refused    Physically Abused: Patient refused    Sexually Abused: Patient refused    Review of Systems: See HPI, otherwise negative ROS  Physical Exam: BP (!) 171/80   Pulse 73   Temp 98.1 F (36.7 C) (Temporal)   Resp 16   Wt 59 kg   SpO2 99%   BMI 25.39 kg/m  General:   Alert, cooperative in NAD Head:  Normocephalic and atraumatic. Respiratory:  Normal work of breathing. Cardiovascular:  RRR  Impression/Plan: Cheryl Hays is here for cataract surgery.  Risks, benefits, limitations, and alternatives regarding cataract surgery have been reviewed with the patient.  Questions have been answered.  All parties agreeable.   Benay Pillow, MD  07/25/2022, 11:46 AM

## 2022-07-25 NOTE — Transfer of Care (Signed)
Immediate Anesthesia Transfer of Care Note  Patient: Cheryl Hays  Procedure(s) Performed: CATARACT EXTRACTION PHACO AND INTRAOCULAR LENS PLACEMENT ([facility]) LEFT CLAREON PANOPTIX  7.86  00:40.7 (Left: Eye)  Patient Location: PACU  Anesthesia Type: MAC  Level of Consciousness: awake, alert  and patient cooperative  Airway and Oxygen Therapy: Patient Spontanous Breathing and Patient connected to supplemental oxygen  Post-op Assessment: Post-op Vital signs reviewed, Patient's Cardiovascular Status Stable, Respiratory Function Stable, Patent Airway and No signs of Nausea or vomiting  Post-op Vital Signs: Reviewed and stable  Complications: No notable events documented.

## 2022-07-25 NOTE — Op Note (Signed)
OPERATIVE NOTE  Starling Zee [id number] 07/25/2022   PREOPERATIVE DIAGNOSIS:  Nuclear sclerotic cataract left eye.  H25.12   POSTOPERATIVE DIAGNOSIS:    Nuclear sclerotic cataract left eye.     PROCEDURE:  Phacoemusification with posterior chamber intraocular lens placement of the left eye   LENS:   Implant Name Type Inv. Item Serial No. Manufacturer Lot No. LRB No. Used Action  LENS CLAREON PAN OPTIX 205 - [id number]  LENS CLAREON PAN OPTIX 205 [id number] [employer]  Left 1 Implanted      Procedure(s): CATARACT EXTRACTION PHACO AND INTRAOCULAR LENS PLACEMENT ([facility]) LEFT CLAREON PANOPTIX  7.86  00:40.7 (Left)  [id number] +20.5 Panoptix nontoric   ULTRASOUND TIME: 0 minutes 40 seconds.  CDE 7.86   SURGEON:  Benay Pillow, MD, MPH   ANESTHESIA:  Topical with tetracaine drops augmented with 1% preservative-free intracameral lidocaine.  ESTIMATED BLOOD LOSS: <1 mL   COMPLICATIONS:  None.   DESCRIPTION OF PROCEDURE:  The patient was identified in the holding room and transported to the operating room and placed in the supine position under the operating microscope.  The left eye was identified as the operative eye and it was prepped and draped in the usual sterile ophthalmic fashion.   A 1.0 millimeter clear-corneal paracentesis was made at the 5:00 position. 0.5 ml of preservative-free 1% lidocaine with epinephrine was injected into the anterior chamber.  The anterior chamber was filled with viscoelastic.  A 2.4 millimeter keratome was used to make a near-clear corneal incision at the 2:00 position.  A curvilinear capsulorrhexis was made with a cystotome and capsulorrhexis forceps.  Balanced salt solution was used to hydrodissect and hydrodelineate the nucleus.   Phacoemulsification was then used in stop and chop fashion to remove the lens nucleus and epinucleus.  The remaining cortex was then removed using the irrigation and aspiration handpiece. Viscoelastic was then placed into the  capsular bag to distend it for lens placement.  A lens was then injected into the capsular bag.  The remaining viscoelastic was aspirated.   Wounds were hydrated with balanced salt solution.  The anterior chamber was inflated to a physiologic pressure with balanced salt solution.  Intracameral cefuroxime 0.1 mL at 10 mg/mL was injected into the eye.  No wound leaks were noted.  The patient was taken to the recovery room in stable condition without complications of anesthesia or surgery  Benay Pillow 07/25/2022, 12:15 PM

## 2022-07-25 NOTE — Addendum Note (Signed)
Addendum  created 07/25/22 1350 by Iran Ouch, MD   Clinical Note Signed, Review and Sign - Ready for Procedure

## 2022-07-25 NOTE — Anesthesia Preprocedure Evaluation (Addendum)
Anesthesia Evaluation  Patient identified by MRN, date of birth, ID band Patient awake    Reviewed: Allergy & Precautions, H&P , NPO status , Patient's Chart, lab work & pertinent test results, reviewed documented beta blocker date and time   Airway Mallampati: II  TM Distance: >3 FB Neck ROM: full    Dental no notable dental hx. (+) Teeth Intact   Pulmonary shortness of breath and with exertion, asthma , former smoker   Pulmonary exam normal breath sounds clear to auscultation       Cardiovascular Exercise Tolerance: Good hypertension, On Medications  Rhythm:regular Rate:Normal     Neuro/Psych  Neuromuscular disease  negative psych ROS   GI/Hepatic Neg liver ROS, hiatal hernia, PUD,GERD  Medicated,,  Endo/Other  negative endocrine ROS    Renal/GU      Musculoskeletal   Abdominal   Peds  Hematology negative hematology ROS (+)   Anesthesia Other Findings   Reproductive/Obstetrics negative OB ROS                             Anesthesia Physical Anesthesia Plan  ASA: 3  Anesthesia Plan: MAC   Post-op Pain Management:    Induction:   PONV Risk Score and Plan:   Airway Management Planned:   Additional Equipment:   Intra-op Plan:   Post-operative Plan:   Informed Consent: I have reviewed the patients History and Physical, chart, labs and discussed the procedure including the risks, benefits and alternatives for the proposed anesthesia with the patient or authorized representative who has indicated his/her understanding and acceptance.       Plan Discussed with: CRNA  Anesthesia Plan Comments:        Anesthesia Quick Evaluation

## 2022-07-26 ENCOUNTER — Encounter: Payer: Self-pay | Admitting: Ophthalmology

## 2022-07-28 NOTE — Progress Notes (Signed)
I,Sulibeya S Dimas,acting as a Education administrator for Lavon Paganini, MD.,have documented all relevant documentation on the behalf of Lavon Paganini, MD,as directed by  Lavon Paganini, MD while in the presence of Lavon Paganini, MD.    Annual Wellness Visit     Patient: Cheryl Hays, Female    DOB: May 15, 1946, 77 y.o.   MRN: [id number] Visit Date: 07/29/2022  Today's Provider: Lavon Paganini, MD   Chief Complaint  Patient presents with   Medicare [place] is a 77 y.o. female who presents today for her Annual Wellness Visit. She reports consuming a  plant based  diet. Home exercise routine includes walking 3.5 hrs per week. She generally feels well. She reports sleeping well. She does not have additional problems to discuss today.   HPI  Eating more plant based diet  Medications: Outpatient Medications Prior to Visit  Medication Sig   atorvastatin (LIPITOR) 20 MG tablet Take 1 tablet (20 mg total) by mouth every other day.   budesonide-formoterol (SYMBICORT) 80-4.5 MCG/ACT inhaler Inhale 2 puffs into the lungs 2 (two) times daily as needed.   Calcium Carb-Cholecalciferol 600-800 MG-UNIT TABS Take 1 tablet by mouth daily. 1200-800   cetirizine (ZYRTEC) 10 MG tablet Take 1 tablet (10 mg total) by mouth daily.   collagenase (SANTYL) 250 UNIT/GM ointment Apply 1 Application topically daily.   fluticasone (FLONASE) 50 MCG/ACT nasal spray Place 2 sprays into both nostrils daily. (Patient taking differently: Place 2 sprays into both nostrils as needed.)   lisinopril (ZESTRIL) 40 MG tablet Take 1 tablet (40 mg total) by mouth daily.   No facility-administered medications prior to visit.    Allergies  Allergen Reactions   Levofloxacin Other (See Comments)    Muscle aches   Celecoxib Rash   Pseudoephedrine Rash   Sulfa Antibiotics Rash    Patient Care Team: Virginia Crews, MD as PCP - General (Family Medicine) Marlyn Corporal Clearnce Sorrel, PA-C as Physician Assistant (Family Medicine) Ralene Bathe, MD as Consulting Physician (Dermatology) Eulogio Bear, MD as Consulting Physician (Ophthalmology)  Review of Systems  Genitourinary:  Positive for frequency.  Skin:  Positive for rash.  Psychiatric/Behavioral:  The patient is nervous/anxious.         Objective    Vitals: BP 110/70 (BP Location: Left Arm, Patient Position: Sitting, Cuff Size: Normal)   Pulse 77   Temp 97.7 F (36.5 C) (Temporal)   Resp 12   Ht 4\' 10"  (1.473 m)   Wt 129 lb 6.4 oz (58.7 kg)   SpO2 99%   BMI 27.04 kg/m     Physical Exam Vitals reviewed.  Constitutional:      General: She is not in acute distress.    Appearance: Normal appearance. She is well-developed. She is not diaphoretic.  HENT:     Head: Normocephalic and atraumatic.     Right Ear: Tympanic membrane, ear canal and external ear normal.     Left Ear: Tympanic membrane, ear canal and external ear normal.     Nose: Nose normal.     Mouth/Throat:     Mouth: Mucous membranes are moist.     Pharynx: Oropharynx is clear. No oropharyngeal exudate.  Eyes:     General: No scleral icterus.    Conjunctiva/sclera: Conjunctivae normal.     Pupils: Pupils are equal, round, and reactive to light.  Neck:     Thyroid: No thyromegaly.  Cardiovascular:     Rate and Rhythm:  Normal rate and regular rhythm.     Heart sounds: Normal heart sounds. No murmur heard. Pulmonary:     Effort: Pulmonary effort is normal. No respiratory distress.     Breath sounds: Normal breath sounds. No wheezing or rales.  Abdominal:     General: There is no distension.     Palpations: Abdomen is soft.     Tenderness: There is no abdominal tenderness.  Musculoskeletal:        General: No deformity.     Cervical back: Neck supple.     Right lower leg: No edema.     Left lower leg: No edema.  Lymphadenopathy:     Cervical: No cervical adenopathy.  Skin:    General: Skin is warm and dry.      Findings: No rash.  Neurological:     Mental Status: She is alert and oriented to person, place, and time. Mental status is at baseline.     Gait: Gait normal.  Psychiatric:        Mood and Affect: Mood normal.        Behavior: Behavior normal.        Thought Content: Thought content normal.      Most recent functional status assessment:    07/29/2022   10:30 AM  In your present state of health, do you have any difficulty performing the following activities:  Hearing? 0  Vision? 0  Difficulty concentrating or making decisions? 0  Walking or climbing stairs? 0  Dressing or bathing? 0  Doing errands, shopping? 0   Most recent fall risk assessment:    07/29/2022   10:29 AM  Fall Risk   Falls in the past year? 0  Number falls in past yr: 0  Injury with Fall? 0  Risk for fall due to : No Fall Risks  Follow up Falls evaluation completed    Most recent depression screenings:    07/29/2022   10:29 AM 02/18/2022   10:36 AM  PHQ 2/9 Scores  PHQ - 2 Score 0 0  PHQ- 9 Score 0 1   Most recent cognitive screening:    07/29/2022   10:30 AM  6CIT Screen  What Year? 0 points  What month? 0 points  What time? 0 points  Count back from 20 0 points  Months in reverse 0 points  Repeat phrase 0 points  Total Score 0 points   Most recent Audit-C alcohol use screening    07/29/2022   10:30 AM  Alcohol Use Disorder Test (AUDIT)  1. How often do you have a drink containing alcohol? 2  2. How many drinks containing alcohol do you have on a typical day when you are drinking? 0  3. How often do you have six or more drinks on one occasion? 0  AUDIT-C Score 2   A score of 3 or more in women, and 4 or more in men indicates increased risk for alcohol abuse, EXCEPT if all of the points are from question 1   No results found for any visits on 07/29/22.  Assessment & Plan     Annual wellness visit done today including the all of the following: Reviewed patient's Family Medical  History Reviewed and updated list of patient's medical providers Assessment of cognitive impairment was done Assessed patient's functional ability Established a written schedule for health screening [place] Completed and Reviewed  Exercise Activities and Dietary recommendations  Goals      Exercise 3x  per week (30 min per time)     Recommend to exercise for 3 days a week for at least 30 minutes at a time.       Increase water intake     Recommend increasing water intake to 4-6 glasses a day.         Immunization History  Administered Date(s) Administered   COVID-19, mRNA, vaccine(Comirnaty)12 years and older 02/16/2022   Fluad Quad(high Dose 65+) 01/21/2019, 01/15/2021, 02/05/2022   Influenza, High Dose Seasonal PF 03/01/2017, 01/23/2018   Influenza-Unspecified 02/01/2016, 03/26/2020   PFIZER Comirnaty(Gray Top)Covid-19 Tri-Sucrose Vaccine 10/05/2020, 02/16/2022   PFIZER(Purple Top)SARS-COV-2 Vaccination 05/21/2019, 06/11/2019, 02/03/2020   Pfizer Covid-19 Vaccine Bivalent Booster 5y-11y 10/05/2020, 03/17/2021   Pneumococcal Conjugate-13 04/16/2014   Pneumococcal Polysaccharide-23 03/31/2008, 03/23/2016   Rsv, Bivalent, Protein Subunit Rsvpref,pf [place]) 02/05/2022   Td 06/02/2021   Tdap 06/03/2011   Zoster Recombinat (Shingrix) 05/18/2021, 08/12/2021   Zoster, Live 05/06/2009    Health Maintenance  Topic Date Due   COVID-19 Vaccine (8 - 2023-24 season) 04/13/2022   Medicare Annual Wellness (AWV)  07/29/2023   DEXA SCAN  08/11/2023   DTaP/Tdap/Td (3 - Td or Tdap) 06/03/2031   Pneumonia Vaccine 26+ Years old  Completed   INFLUENZA VACCINE  Completed   Hepatitis C Screening  Completed   Zoster Vaccines- Shingrix  Completed   HPV VACCINES  Aged Out   Fecal DNA (Cologuard)  Discontinued     Discussed health benefits of physical activity, and encouraged her to engage in regular exercise appropriate for her age and condition.    Problem List  Items Addressed This Visit       Cardiovascular and Mediastinum   Essential hypertension    Well controlled on manual recheck Continue current meds Declines repeat BMP today F/u in 53m        Respiratory   OSA (obstructive sleep apnea)    Did not tolerate CPAP Made sleep worse  Mouth tape is helping prn        Other   Prediabetes    Declines repeat A1c today Continue low carb diet      Other Visit Diagnoses     Encounter for annual wellness visit (AWV) in Medicare patient    -  Primary   Encounter for annual physical exam            Return in about 6 months (around 01/29/2023) for chronic disease f/u.     I, Lavon Paganini, MD, have reviewed all documentation for this visit. The documentation on 07/29/22 for the exam, diagnosis, procedures, and orders are all accurate and complete.   Kathee Tumlin, Dionne Bucy, MD, MPH [facility]

## 2022-07-29 ENCOUNTER — Encounter: Payer: Self-pay | Admitting: Family Medicine

## 2022-07-29 ENCOUNTER — Ambulatory Visit (INDEPENDENT_AMBULATORY_CARE_PROVIDER_SITE_OTHER): Payer: PPO | Admitting: Family Medicine

## 2022-07-29 VITALS — BP 110/70 | HR 77 | Temp 97.7°F | Resp 12 | Ht <= 58 in | Wt 129.4 lb

## 2022-07-29 DIAGNOSIS — I10 Essential (primary) hypertension: Secondary | ICD-10-CM

## 2022-07-29 DIAGNOSIS — Z0000 Encounter for general adult medical examination without abnormal findings: Secondary | ICD-10-CM | POA: Diagnosis not present

## 2022-07-29 DIAGNOSIS — R7303 Prediabetes: Secondary | ICD-10-CM

## 2022-07-29 DIAGNOSIS — G4733 Obstructive sleep apnea (adult) (pediatric): Secondary | ICD-10-CM

## 2022-07-29 NOTE — Assessment & Plan Note (Signed)
Declines repeat A1c today Continue low carb diet

## 2022-07-29 NOTE — Assessment & Plan Note (Signed)
Did not tolerate CPAP Made sleep worse  Mouth tape is helping prn

## 2022-07-29 NOTE — Assessment & Plan Note (Signed)
Well controlled on manual recheck Continue current meds Declines repeat BMP today F/u in 24m

## 2022-08-11 DIAGNOSIS — L281 Prurigo nodularis: Secondary | ICD-10-CM | POA: Diagnosis not present

## 2022-08-11 DIAGNOSIS — L858 Other specified epidermal thickening: Secondary | ICD-10-CM | POA: Diagnosis not present

## 2022-08-11 DIAGNOSIS — L820 Inflamed seborrheic keratosis: Secondary | ICD-10-CM | POA: Diagnosis not present

## 2022-08-11 DIAGNOSIS — L309 Dermatitis, unspecified: Secondary | ICD-10-CM | POA: Diagnosis not present

## 2022-08-17 DIAGNOSIS — Z961 Presence of intraocular lens: Secondary | ICD-10-CM | POA: Diagnosis not present

## 2023-01-31 ENCOUNTER — Ambulatory Visit (INDEPENDENT_AMBULATORY_CARE_PROVIDER_SITE_OTHER): Payer: PPO | Admitting: Family Medicine

## 2023-01-31 ENCOUNTER — Encounter: Payer: Self-pay | Admitting: Family Medicine

## 2023-01-31 VITALS — BP 138/70 | HR 80 | Ht <= 58 in | Wt 130.8 lb

## 2023-01-31 DIAGNOSIS — I10 Essential (primary) hypertension: Secondary | ICD-10-CM | POA: Diagnosis not present

## 2023-01-31 DIAGNOSIS — R7303 Prediabetes: Secondary | ICD-10-CM | POA: Diagnosis not present

## 2023-01-31 DIAGNOSIS — E7800 Pure hypercholesterolemia, unspecified: Secondary | ICD-10-CM

## 2023-01-31 DIAGNOSIS — Z23 Encounter for immunization: Secondary | ICD-10-CM | POA: Diagnosis not present

## 2023-01-31 DIAGNOSIS — J4520 Mild intermittent asthma, uncomplicated: Secondary | ICD-10-CM | POA: Diagnosis not present

## 2023-01-31 NOTE — Assessment & Plan Note (Addendum)
Well controlled on Symbicort. -Continue current medication regimen.

## 2023-01-31 NOTE — Assessment & Plan Note (Signed)
Controlled with diet. -Continue dietary management. -Order A1c.

## 2023-01-31 NOTE — Assessment & Plan Note (Signed)
On Atorvastatin 20mg  every other day. -Continue current medication regimen. -Order lipid panel.

## 2023-01-31 NOTE — Progress Notes (Signed)
Established Patient Office Visit  Subjective   Patient ID: Cheryl Hays, female    DOB: 11-08-1945  Age: 77 y.o. MRN: [id number]  Chief Complaint  Patient presents with   Medical Management of Chronic Issues    6 month follow up on wellness, would like to discuss RSV    HPI  Discussed the use of AI scribe software for clinical note transcription with the patient, who gave verbal consent to proceed.  History of Present Illness   [name] with a history of hypertension, hyperlipidemia, prediabetes, and asthma presents for a routine chronic disease follow-up. The patient's hypertension is managed with lisinopril 40mg  daily, and hyperlipidemia is controlled with atorvastatin 20mg  every other day. The patient's prediabetes is managed through diet control, and asthma is well controlled on Symbicort. The conversation does not provide additional information about the patient's symptoms or disease progression.         ROS per HPI    Objective:     BP 138/70 (BP Location: Left Arm, Patient Position: Sitting, Cuff Size: Normal)   Pulse 80   Ht 4\' 10"  (1.473 m)   Wt 130 lb 12.8 oz (59.3 kg)   SpO2 100%   BMI 27.34 kg/m    Physical Exam Vitals reviewed.  Constitutional:      General: She is not in acute distress.    Appearance: Normal appearance. She is well-developed. She is not diaphoretic.  HENT:     Head: Normocephalic and atraumatic.  Eyes:     General: No scleral icterus.    Conjunctiva/sclera: Conjunctivae normal.  Neck:     Thyroid: No thyromegaly.  Cardiovascular:     Rate and Rhythm: Normal rate and regular rhythm.     Heart sounds: Normal heart sounds. No murmur heard. Pulmonary:     Effort: Pulmonary effort is normal. No respiratory distress.     Breath sounds: Normal breath sounds. No wheezing, rhonchi or rales.  Musculoskeletal:     Cervical back: Neck supple.     Right lower leg: No edema.     Left lower leg: No edema.  Lymphadenopathy:      Cervical: No cervical adenopathy.  Skin:    General: Skin is warm and dry.     Findings: No rash.  Neurological:     Mental Status: She is alert and oriented to person, place, and time. Mental status is at baseline.  Psychiatric:        Mood and Affect: Mood normal.        Behavior: Behavior normal.      No results found for any visits on 01/31/23.    The 10-year ASCVD risk score (Arnett DK, et al., 2019) is: 33.1%    Assessment & Plan:   Problem List Items Addressed This Visit       Cardiovascular and Mediastinum   Essential hypertension - Primary    Well controlled on Lisinopril 40mg  daily. -Continue current medication regimen.      Relevant Orders   Comprehensive metabolic panel     Respiratory   Asthma    Well controlled on Symbicort. -Continue current medication regimen.        Other   HLD (hyperlipidemia)    On Atorvastatin 20mg  every other day. -Continue current medication regimen. -Order lipid panel.      Relevant Orders   Comprehensive metabolic panel   Lipid panel   Prediabetes    Controlled with diet. -Continue dietary management. -Order A1c.  Relevant Orders   Hemoglobin A1c   Other Visit Diagnoses     Needs flu shot       Relevant Orders   Flu Vaccine Trivalent High Dose (Fluad)          General Health Maintenance -Order CMP for routine monitoring.       Return in about 6 months (around 07/31/2023) for CPE, AWV.    Shirlee Latch, MD

## 2023-01-31 NOTE — Assessment & Plan Note (Signed)
Well controlled on Lisinopril 40mg  daily. -Continue current medication regimen.

## 2023-02-01 ENCOUNTER — Encounter: Payer: Self-pay | Admitting: Family Medicine

## 2023-02-02 MED ORDER — OXYBUTYNIN CHLORIDE ER 5 MG PO TB24: 5.0000 mg | ORAL_TABLET | Freq: Every day | ORAL | 1 refills | Status: DC

## 2023-02-06 ENCOUNTER — Telehealth: Payer: Self-pay | Admitting: Family Medicine

## 2023-02-06 ENCOUNTER — Other Ambulatory Visit: Payer: Self-pay | Admitting: Family Medicine

## 2023-02-06 NOTE — Telephone Encounter (Signed)
Medication Refill - Medication: oxybutynin (DITROPAN-XL) 5 MG 24 hr tablet, atorvastatin (LIPITOR) 20 MG tablet, cetirizine (ZYRTEC) 10 MG tablet , lisinopril (ZESTRIL) 40 MG tablet   Has the patient contacted their pharmacy? Yes  (Agent: If yes, when and what did the pharmacy advise?) Patient medication  oxbutynin was sent to the wrong pharmacy it should have been sent to [facility] in [place]. Patient would like all medications sent to [facility] in [place].  Patient is going on a 3 week trip leaving on Wednesday 9/25 and does not want to run out of medication. Patient would like request expedited  Preferred Pharmacy (with phone number or [place] name):   [name] - [address], [place] - [phone] Phone: [phone]  Fax: [phone]      Has the patient been seen for an appointment in the last year OR does the patient have an upcoming appointment? Yes.    Agent: Please be advised that RX refills may take up to 3 business days. We ask that you follow-up with your pharmacy.

## 2023-02-06 NOTE — Telephone Encounter (Signed)
Medication Refill - Medication: oxybutynin (DITROPAN-XL) 5 MG 24 hr tablet  cetirizine (ZYRTEC) 10 MG tablet atorvastatin (LIPITOR) 20 MG tablet  lisinopril (ZESTRIL) 40 MG tablet  Has the patient contacted their pharmacy? Yes.   (She asked these to be sent to [facility] (with phone number or [facility] name): [name] - [place], [place] - [address]  Has the patient been seen for an appointment in the last year OR does the patient have an upcoming appointment? Yes.    Agent: Please be advised that RX refills may take up to 3 business days. We ask that you follow-up with your pharmacy.  Pt is going on a 3 week cruise, leaving in the morning.  She called earlier this morning and thought it was all good.

## 2023-02-07 ENCOUNTER — Telehealth: Payer: Self-pay | Admitting: Family Medicine

## 2023-02-07 ENCOUNTER — Other Ambulatory Visit: Payer: Self-pay | Admitting: Family Medicine

## 2023-02-07 MED ORDER — ATORVASTATIN CALCIUM 20 MG PO TABS: 20.0000 mg | ORAL_TABLET | ORAL | 3 refills | Status: DC

## 2023-02-07 MED ORDER — OXYBUTYNIN CHLORIDE ER 5 MG PO TB24: 5.0000 mg | ORAL_TABLET | Freq: Every day | ORAL | 1 refills | Status: DC

## 2023-02-07 MED ORDER — LISINOPRIL 40 MG PO TABS: 40.0000 mg | ORAL_TABLET | Freq: Every day | ORAL | 3 refills | Status: DC

## 2023-02-07 MED ORDER — CETIRIZINE HCL 10 MG PO TABS: 10.0000 mg | ORAL_TABLET | Freq: Every day | ORAL | 3 refills | Status: AC

## 2023-02-07 NOTE — Telephone Encounter (Signed)
Duplicate request- filled today 02/07/23 Requested Prescriptions  Pending Prescriptions Disp Refills   oxybutynin (DITROPAN-XL) 5 MG 24 hr tablet 30 tablet 1    Sig: Take 1 tablet (5 mg total) by mouth at bedtime.     Urology:  Bladder Agents Passed - 02/06/2023 11:19 AM      Passed - Valid encounter within last 12 months    Recent Outpatient Visits           1 week ago Essential hypertension   [place] [facility] [place], Marzella Schlein, MD   6 months ago Encounter for annual wellness visit (AWV) in Medicare patient   [facility], Marzella Schlein, MD   11 months ago Essential hypertension   [place] [facility] [place], Marzella Schlein, MD   1 year ago Lumbar radiculitis   [place] [facility] Malva Limes, MD   1 year ago Encounter for annual wellness visit (AWV) in Medicare patient   [place] [facility] [place], Marzella Schlein, MD       Future Appointments             In 5 months Bacigalupo, Marzella Schlein, MD [facility]             atorvastatin (LIPITOR) 20 MG tablet 45 tablet 3    Sig: Take 1 tablet (20 mg total) by mouth every other day.     Cardiovascular:  Antilipid - Statins Failed - 02/06/2023 11:19 AM      Failed - Lipid Panel in normal range within the last 12 months    Cholesterol, Total  Date Value Ref Range Status  01/31/2023 176 100 - 199 mg/dL Final   LDL Cholesterol (Calc)  Date Value Ref Range Status  03/28/2017 131 (H) mg/dL (calc) Final    Comment:    Reference range: <100 . Desirable range <100 mg/dL for primary prevention;   <70 mg/dL for patients with CHD or diabetic patients  with > or = 2 CHD risk factors. Marland Kitchen LDL-C is now calculated using the Martin-Hopkins  calculation, which is a validated novel method providing  better accuracy than the Friedewald equation in the  estimation of LDL-C.  Horald Pollen et al. Lenox Ahr.  2952;841(32): 2061-2068  (http://education.[employer].com/faq/FAQ164)    LDL Chol Calc (NIH)  Date Value Ref Range Status  01/31/2023 85 0 - 99 mg/dL Final   HDL  Date Value Ref Range Status  01/31/2023 73 >39 mg/dL Final   Triglycerides  Date Value Ref Range Status  01/31/2023 100 0 - 149 mg/dL Final         Passed - Patient is not pregnant      Passed - Valid encounter within last 12 months    Recent Outpatient Visits           1 week ago Essential hypertension   [place] [facility] [place], Marzella Schlein, MD   6 months ago Encounter for annual wellness visit (AWV) in Medicare patient   [facility] [place], Marzella Schlein, MD   11 months ago Essential hypertension   [facility] [place], Marzella Schlein, MD   1 year ago Lumbar radiculitis   [place] [facility] Malva Limes, MD   1 year ago Encounter for annual wellness visit (AWV) in Medicare patient   [facility] [place], Marzella Schlein, MD  Future Appointments             In 5 months Bacigalupo, Marzella Schlein, MD [facility], [facility]             cetirizine (ZYRTEC) 10 MG tablet 90 tablet 3    Sig: Take 1 tablet (10 mg total) by mouth daily.     Ear, Nose, and Throat:  Antihistamines 2 Passed - 02/06/2023 11:19 AM      Passed - Cr in normal range and within 360 days    Creat  Date Value Ref Range Status  03/28/2017 0.65 0.60 - 0.93 mg/dL Final    Comment:    For patients >76 years of age, the reference limit for Creatinine is approximately 13% higher for people identified as African-American. .    Creatinine, Ser  Date Value Ref Range Status  01/31/2023 0.69 0.57 - 1.00 mg/dL Final         Passed - Valid encounter within last 12 months    Recent Outpatient Visits           1 week ago Essential hypertension   [place] [facility] Marzella Schlein, MD   6 months ago Encounter for annual wellness visit (AWV) in Medicare patient   [facility] [place], Marzella Schlein, MD   11 months ago Essential hypertension   [place] [facility] [place], Marzella Schlein, MD   1 year ago Lumbar radiculitis   [place] [facility] Malva Limes, MD   1 year ago Encounter for annual wellness visit (AWV) in Medicare patient   [place] [facility] [place], Marzella Schlein, MD       Future Appointments             In 5 months Bacigalupo, Marzella Schlein, MD [facility]             lisinopril (ZESTRIL) 40 MG tablet 90 tablet 3    Sig: Take 1 tablet (40 mg total) by mouth daily.     Cardiovascular:  ACE Inhibitors Passed - 02/06/2023 11:19 AM      Passed - Cr in normal range and within 180 days    Creat  Date Value Ref Range Status  03/28/2017 0.65 0.60 - 0.93 mg/dL Final    Comment:    For patients >36 years of age, the reference limit for Creatinine is approximately 13% higher for people identified as African-American. .    Creatinine, Ser  Date Value Ref Range Status  01/31/2023 0.69 0.57 - 1.00 mg/dL Final         Passed - K in normal range and within 180 days    Potassium  Date Value Ref Range Status  01/31/2023 4.3 3.5 - 5.2 mmol/L Final         Passed - Patient is not pregnant      Passed - Last BP in normal range    BP Readings from Last 1 Encounters:  01/31/23 138/70         Passed - Valid encounter within last 6 months    Recent Outpatient Visits           1 week ago Essential hypertension   [place] [facility] [place], Marzella Schlein, MD   6 months ago Encounter for annual wellness visit (AWV) in Medicare patient   [facility], Marzella Schlein, MD   [phone]  months ago Essential hypertension   [place] [facility] [place], Marzella Schlein,  MD   1 year ago Lumbar radiculitis   [facility] Malva Limes, MD   1 year ago Encounter for annual wellness visit (AWV) in Medicare patient   [facility] [place], Marzella Schlein, MD       Future Appointments             In 5 months Bacigalupo, Marzella Schlein, MD [facility]

## 2023-02-07 NOTE — Telephone Encounter (Signed)
Pt needs the Atorvastatin, Lisinopril, Zyrtec  she does not want the nasal spray.  These all need to be sent to the [facility] in [place].  CB@ [phone]

## 2023-02-07 NOTE — Telephone Encounter (Signed)
Pt called back saying she needs the Atorvastation, Lisinapril,

## 2023-02-07 NOTE — Telephone Encounter (Signed)
refilled 

## 2023-02-07 NOTE — Telephone Encounter (Addendum)
Pt called in for status of med refill of oxybutynin (DITROPAN-XL) 5 MG 24 hr  tablet, requested on 09/23  she says she is living on a flight today for 3 weeks and needs this before she leaves, to be sent to, [id number] - [place], [place] - [address] . She is going to check with the pharmacy to see if its there yet.

## 2023-04-01 ENCOUNTER — Other Ambulatory Visit: Payer: Self-pay | Admitting: Family Medicine

## 2023-04-03 NOTE — Telephone Encounter (Signed)
Requested Prescriptions  Pending Prescriptions Disp Refills   oxybutynin (DITROPAN-XL) 5 MG 24 hr tablet [Pharmacy Med Name: oxyBUTYnin CL ER 5 MG TABLET] 90 tablet 1    Sig: TAKE 1 TABLET BY MOUTH AT BEDTIME     Urology:  Bladder Agents Passed - 04/01/2023  6:52 AM      Passed - Valid encounter within last 12 months    Recent Outpatient Visits           2 months ago Essential hypertension   [place] [facility] [place], Marzella Schlein, MD   8 months ago Encounter for annual wellness visit (AWV) in Medicare patient   [facility] [place], Marzella Schlein, MD   1 year ago Essential hypertension   [place] [facility] [place], Marzella Schlein, MD   1 year ago Lumbar radiculitis   [place] [facility] Malva Limes, MD   1 year ago Encounter for annual wellness visit (AWV) in Medicare patient   [place] [facility] [place], Marzella Schlein, MD       Future Appointments             In 3 months Bacigalupo, Marzella Schlein, MD [facility]

## 2023-04-06 DIAGNOSIS — L814 Other melanin hyperpigmentation: Secondary | ICD-10-CM | POA: Diagnosis not present

## 2023-04-06 DIAGNOSIS — L281 Prurigo nodularis: Secondary | ICD-10-CM | POA: Diagnosis not present

## 2023-04-06 DIAGNOSIS — L2089 Other atopic dermatitis: Secondary | ICD-10-CM | POA: Diagnosis not present

## 2023-04-06 DIAGNOSIS — R21 Rash and other nonspecific skin eruption: Secondary | ICD-10-CM | POA: Diagnosis not present

## 2023-04-06 DIAGNOSIS — L821 Other seborrheic keratosis: Secondary | ICD-10-CM | POA: Diagnosis not present

## 2023-04-06 DIAGNOSIS — L578 Other skin changes due to chronic exposure to nonionizing radiation: Secondary | ICD-10-CM | POA: Diagnosis not present

## 2023-04-19 ENCOUNTER — Other Ambulatory Visit: Payer: Self-pay | Admitting: Family Medicine

## 2023-04-19 DIAGNOSIS — Z0000 Encounter for general adult medical examination without abnormal findings: Secondary | ICD-10-CM

## 2023-04-28 ENCOUNTER — Encounter: Payer: Self-pay | Admitting: Family Medicine

## 2023-04-28 ENCOUNTER — Ambulatory Visit (INDEPENDENT_AMBULATORY_CARE_PROVIDER_SITE_OTHER): Payer: PPO | Admitting: Family Medicine

## 2023-04-28 VITALS — BP 159/74 | HR 75 | Temp 97.5°F | Ht <= 58 in | Wt 138.7 lb

## 2023-04-28 DIAGNOSIS — L03011 Cellulitis of right finger: Secondary | ICD-10-CM | POA: Diagnosis not present

## 2023-04-28 DIAGNOSIS — I10 Essential (primary) hypertension: Secondary | ICD-10-CM

## 2023-04-28 MED ORDER — MUPIROCIN 2 % EX OINT: 1.0000 | TOPICAL_OINTMENT | Freq: Two times a day (BID) | CUTANEOUS | 0 refills | Status: AC

## 2023-04-28 MED ORDER — DOXYCYCLINE HYCLATE 100 MG PO TABS: 100.0000 mg | ORAL_TABLET | Freq: Two times a day (BID) | ORAL | 0 refills | Status: DC

## 2023-04-28 NOTE — Progress Notes (Signed)
Acute Office Visit  Introduced to nurse practitioner role and practice setting.  All questions answered.  Discussed provider/patient relationship and expectations.   Subjective:     Patient ID: Cheryl Hays, female    DOB: 1945/08/25, 77 y.o.   MRN: [id number]  Chief Complaint  Patient presents with   right finger red and painful below nail   Pt presents with concerns for R ring finger swelling, erythema,  and throbbing pain for three days. States started with hang nail. She has tried warm salt water soaks and mupirocin, which is expired. States is have gotten better since yesterday, but she is concerned for infection, especially because she is going out of town in two days.     Denies systemic systems - no numbness, tingling, or warmth to finger. Review of Systems  Constitutional:  Negative for chills, fever and malaise/fatigue.  Gastrointestinal:  Negative for abdominal pain, diarrhea, nausea and vomiting.  Neurological:  Negative for dizziness, tingling, sensory change and weakness.  All other systems reviewed and are negative.     Objective:    BP (!) 159/74 (BP Location: Right Arm, Patient Position: Sitting, Cuff Size: Normal)   Pulse 75   Temp (!) 97.5 F (36.4 C) (Oral)   Ht 4\' 9"  (1.448 m)   Wt 138 lb 11.2 oz (62.9 kg)   SpO2 100%   BMI 30.01 kg/m    Physical Exam Constitutional:      Appearance: Normal appearance. She is normal weight.  HENT:     Head: Normocephalic.     Nose: Nose normal.  Eyes:     Pupils: Pupils are equal, round, and reactive to light.  Cardiovascular:     Rate and Rhythm: Normal rate and regular rhythm.     Pulses: Normal pulses.     Heart sounds: Normal heart sounds.  Pulmonary:     Effort: Pulmonary effort is normal.     Breath sounds: Normal breath sounds.  Musculoskeletal:        General: No swelling or tenderness.  Skin:    General: Skin is warm and dry.     Capillary Refill: Capillary refill takes less than 2 seconds.      Coloration: Skin is not ashen, cyanotic, jaundiced, mottled or pale.     Findings: Erythema and lesion present. No bruising, ecchymosis or signs of injury.     Comments: R fourth ring finger, erythema, mild edema, mild tenderness, no purulence, no fluctuance felt.   Neurological:     Mental Status: She is alert.    No results found for any visits on 04/28/23.     Assessment & Plan:   Problem List Items Addressed This Visit       Cardiovascular and Mediastinum   Essential hypertension   Pt's BP elevated during visit today, hx of HTN and well controlled on lisinopril 40 mg daily. Appears situational/episodic. Denies any vision changes and headaches.  Please monitor at home, as previous visits at clinic has been normotensive. Continue medication mgmt as prescribed, by PCP.  If continues to be elevated at home SBP >130 or DBP>80 - please follow up with PCP for potential further mgmt.         Musculoskeletal and Integument   Paronychia of right ring finger - Primary   Concerns for skin infection to R ring finger, otherwise exam unremarkable, no systemic symptoms of infection.  Continue - warm water soaks to affected finger - Mupirocin 2% two times daily - If erythema,  edema, pain has not improved by 05/01/23, please start doxycycline 100 mg tablet BID for five days.   Discussed worsening signs and symptoms of infection and when to present to urgent care or ED for further mgmt and evaluation. - Fever, dizziness, confusion, chills, palpitations, shortness or breath, worsening pain, drainage, decreased feeling in finger.      Relevant Medications   mupirocin ointment (BACTROBAN) 2 %   doxycycline (VIBRA-TABS) 100 MG tablet     Meds ordered this encounter  Medications   mupirocin ointment (BACTROBAN) 2 %    Sig: Apply 1 Application topically 2 (two) times daily.    Dispense:  22 g    Refill:  0   doxycycline (VIBRA-TABS) 100 MG tablet    Sig: Take 1 tablet (100 mg total)  by mouth 2 (two) times daily.    Dispense:  10 tablet    Refill:  0    Return if symptoms worsen or fail to improve.  I, Sallee Provencal, FNP, have reviewed all documentation for this visit. The documentation on 04/28/23 for the exam, diagnosis, procedures, and orders are all accurate and complete.   Sallee Provencal, FNP

## 2023-04-28 NOTE — Assessment & Plan Note (Addendum)
Concerns for skin infection to R ring finger, otherwise exam unremarkable, no systemic symptoms of infection.  Continue - warm water soaks to affected finger - Mupirocin 2% two times daily - If erythema, edema, pain has not improved by 05/01/23, please start doxycycline 100 mg tablet BID for five days.   Discussed worsening signs and symptoms of infection and when to present to urgent care or ED for further mgmt and evaluation. - Fever, dizziness, confusion, chills, palpitations, shortness or breath, worsening pain, drainage, decreased feeling in finger.

## 2023-04-28 NOTE — Assessment & Plan Note (Addendum)
Pt's BP elevated during visit today, hx of HTN and well controlled on lisinopril 40 mg daily. Appears situational/episodic. Denies any vision changes and headaches.  Please monitor at home, as previous visits at clinic has been normotensive. Continue medication mgmt as prescribed, by PCP.  If continues to be elevated at home SBP >130 or DBP>80 - please follow up with PCP for potential further mgmt.

## 2023-06-01 DIAGNOSIS — L2089 Other atopic dermatitis: Secondary | ICD-10-CM | POA: Diagnosis not present

## 2023-06-01 DIAGNOSIS — Z85828 Personal history of other malignant neoplasm of skin: Secondary | ICD-10-CM | POA: Diagnosis not present

## 2023-06-01 DIAGNOSIS — L578 Other skin changes due to chronic exposure to nonionizing radiation: Secondary | ICD-10-CM | POA: Diagnosis not present

## 2023-06-01 DIAGNOSIS — L821 Other seborrheic keratosis: Secondary | ICD-10-CM | POA: Diagnosis not present

## 2023-06-01 DIAGNOSIS — L570 Actinic keratosis: Secondary | ICD-10-CM | POA: Diagnosis not present

## 2023-06-06 ENCOUNTER — Ambulatory Visit
Admission: RE | Admit: 2023-06-06 | Discharge: 2023-06-06 | Disposition: A | Discharge status: Home / Self Care | Payer: PPO | Source: Ambulatory Visit | Attending: Family Medicine | Admitting: Family Medicine

## 2023-06-06 DIAGNOSIS — Z1231 Encounter for screening mammogram for malignant neoplasm of breast: Secondary | ICD-10-CM | POA: Diagnosis not present

## 2023-06-06 DIAGNOSIS — Z0000 Encounter for general adult medical examination without abnormal findings: Secondary | ICD-10-CM

## 2023-06-08 ENCOUNTER — Encounter: Payer: Self-pay | Admitting: Family Medicine

## 2023-07-31 ENCOUNTER — Encounter: Payer: PPO | Admitting: Family Medicine

## 2023-08-08 DIAGNOSIS — L578 Other skin changes due to chronic exposure to nonionizing radiation: Secondary | ICD-10-CM | POA: Diagnosis not present

## 2023-08-08 DIAGNOSIS — L2081 Atopic neurodermatitis: Secondary | ICD-10-CM | POA: Diagnosis not present

## 2023-08-08 DIAGNOSIS — Z85828 Personal history of other malignant neoplasm of skin: Secondary | ICD-10-CM | POA: Diagnosis not present

## 2023-08-08 DIAGNOSIS — L281 Prurigo nodularis: Secondary | ICD-10-CM | POA: Diagnosis not present

## 2023-08-08 DIAGNOSIS — L821 Other seborrheic keratosis: Secondary | ICD-10-CM | POA: Diagnosis not present

## 2023-08-08 DIAGNOSIS — Z79899 Other long term (current) drug therapy: Secondary | ICD-10-CM | POA: Diagnosis not present

## 2023-08-08 DIAGNOSIS — L570 Actinic keratosis: Secondary | ICD-10-CM | POA: Diagnosis not present

## 2023-08-11 ENCOUNTER — Encounter: Payer: Self-pay | Admitting: Family Medicine

## 2023-08-14 ENCOUNTER — Encounter: Payer: Self-pay | Admitting: Family Medicine

## 2023-08-14 ENCOUNTER — Ambulatory Visit (INDEPENDENT_AMBULATORY_CARE_PROVIDER_SITE_OTHER): Admitting: Family Medicine

## 2023-08-14 VITALS — BP 150/70 | HR 68 | Ht <= 58 in | Wt 134.4 lb

## 2023-08-14 DIAGNOSIS — Z0001 Encounter for general adult medical examination with abnormal findings: Secondary | ICD-10-CM | POA: Diagnosis not present

## 2023-08-14 DIAGNOSIS — E7800 Pure hypercholesterolemia, unspecified: Secondary | ICD-10-CM | POA: Diagnosis not present

## 2023-08-14 DIAGNOSIS — I10 Essential (primary) hypertension: Secondary | ICD-10-CM | POA: Diagnosis not present

## 2023-08-14 DIAGNOSIS — M810 Age-related osteoporosis without current pathological fracture: Secondary | ICD-10-CM | POA: Diagnosis not present

## 2023-08-14 DIAGNOSIS — Z0000 Encounter for general adult medical examination without abnormal findings: Secondary | ICD-10-CM

## 2023-08-14 NOTE — Patient Instructions (Signed)
 Please contact [phone] to schedule your Bone Density Scan. You will be asked your location preference to have procedure performed. You have two options listed below.  1) [facility] located at [address] [place], [place] [address] 2) [facility] located at [address] [place], [place] [address]  Upon results being received our office will contact you. As well as all results can be viewed through your MyChart. Please feel free to contact us if you have any further questions or concerns.

## 2023-08-14 NOTE — Assessment & Plan Note (Signed)
 Blood pressure is elevated at 150/70 mmHg despite lisinopril 40 mg daily. She has not monitored blood pressure at home due to anxiety about readings. Suspected situational hypertension; home readings needed before medication adjustments. Currently on maximum lisinopril dose; if home readings are elevated, plan to add hydrochlorothiazide. Long-term elevated blood pressure can increase workload on kidneys and heart, necessitating additional medication if not controlled. - Check blood pressure at home and report readings via MyChart - Consider adding hydrochlorothiazide to lisinopril if home blood pressure readings are elevated

## 2023-08-14 NOTE — Assessment & Plan Note (Signed)
 Cholesterol levels are well-managed with atorvastatin 20 mg every other day. HDL is high and LDL is low, both favorable. She is compliant with the current medication regimen. - Continue atorvastatin 20 mg every other day

## 2023-08-14 NOTE — Progress Notes (Signed)
 Annual Wellness Visit     Patient: Cheryl Hays, Female    DOB: January 13, 1946, 78 y.o.   MRN: [id number] Visit Date: 08/14/2023  Today's Provider: Shirlee Latch, MD   Chief Complaint  Patient presents with   Annual Exam    Last completed AWV completed 07/29/22 Diet -  No additional sodium and trying not to eat a lot of meat Exercise - tries walking as much as she can but not recently due to weather. If she is able at least 5 days for at least a mile and a half to two miles Feeling - well  Sleeping - well Concerns -  on renvoke and has labs from dermatology she would like provider to see. Labs completed on 08/08/23   Subjective    Rigby Brewton is a 78 y.o. female who presents today for her Annual Wellness Visit.  Discussed the use of AI scribe software for clinical note transcription with the patient, who gave verbal consent to proceed.  History of Present Illness   The patient, with a history of hypertension and osteoporosis, presents for a wellness visit. She has been taking Lisinopril 40mg  daily and Atorvastatin 20mg  every other day. The patient recently had labs done at another facility and brought the results to discuss. The labs showed good kidney and liver function, normal glucose levels, and high good cholesterol. The patient's RDW was high, but the doctor reassured her that it was not a concern unless other measures were abnormal. The patient's blood pressure was elevated during the visit, which has been a recent trend.             Medications: Outpatient Medications Prior to Visit  Medication Sig   atorvastatin (LIPITOR) 20 MG tablet Take 1 tablet (20 mg total) by mouth every other day.   budesonide-formoterol (SYMBICORT) 80-4.5 MCG/ACT inhaler Inhale 2 puffs into the lungs 2 (two) times daily as needed.   Calcium Carb-Cholecalciferol 600-800 MG-UNIT TABS Take 1 tablet by mouth daily. 1200-800   cetirizine (ZYRTEC) 10 MG tablet Take 1 tablet (10 mg  total) by mouth daily.   collagenase (SANTYL) 250 UNIT/GM ointment Apply 1 Application topically daily.   doxycycline (VIBRA-TABS) 100 MG tablet Take 1 tablet (100 mg total) by mouth 2 (two) times daily.   fluticasone (FLONASE) 50 MCG/ACT nasal spray Place 2 sprays into both nostrils daily.   lisinopril (ZESTRIL) 40 MG tablet Take 1 tablet (40 mg total) by mouth daily.   mupirocin ointment (BACTROBAN) 2 % Apply 1 Application topically 2 (two) times daily.   oxybutynin (DITROPAN-XL) 5 MG 24 hr tablet TAKE 1 TABLET BY MOUTH AT BEDTIME   Upadacitinib ER (RINVOQ) 15 MG TB24 Take 15 mg by mouth daily.   No facility-administered medications prior to visit.    Allergies  Allergen Reactions   Levofloxacin Other (See Comments)    Muscle aches   Celecoxib Rash   Pseudoephedrine Rash   Sulfa Antibiotics Rash    Patient Care Team: Erasmo Downer, MD as PCP - General (Family Medicine) Rosezetta Schlatter Alessandra Bevels, PA-C as Physician Assistant (Family Medicine) Deirdre Evener, MD as Consulting Physician (Dermatology) [place] Crane, MD as Consulting Physician (Ophthalmology)  Review of Systems       Objective    Vitals: BP (!) 150/70 (BP Location: Right Arm, Patient Position: Sitting, Cuff Size: Normal)   Pulse 68   Ht 4\' 9"  (1.448 m)   Wt 134 lb 6.4 oz (61 kg)   SpO2 100%  BMI 29.08 kg/m        Physical Exam Vitals reviewed.  Constitutional:      General: She is not in acute distress.    Appearance: Normal appearance. She is well-developed. She is not diaphoretic.  HENT:     Head: Normocephalic and atraumatic.     Right Ear: Tympanic membrane, ear canal and external ear normal.     Left Ear: Tympanic membrane, ear canal and external ear normal.     Nose: Nose normal.     Mouth/Throat:     Mouth: Mucous membranes are moist.     Pharynx: Oropharynx is clear. No oropharyngeal exudate.  Eyes:     General: No scleral icterus.    Conjunctiva/sclera: Conjunctivae normal.      Pupils: Pupils are equal, round, and reactive to light.  Neck:     Thyroid: No thyromegaly.  Cardiovascular:     Rate and Rhythm: Normal rate and regular rhythm.     Heart sounds: Normal heart sounds. No murmur heard. Pulmonary:     Effort: Pulmonary effort is normal. No respiratory distress.     Breath sounds: Normal breath sounds. No wheezing or rales.  Abdominal:     General: There is no distension.     Palpations: Abdomen is soft.     Tenderness: There is no abdominal tenderness.  Musculoskeletal:        General: No deformity.     Cervical back: Neck supple.     Right lower leg: No edema.     Left lower leg: No edema.  Lymphadenopathy:     Cervical: No cervical adenopathy.  Skin:    General: Skin is warm and dry.     Findings: No rash.  Neurological:     Mental Status: She is alert and oriented to person, place, and time. Mental status is at baseline.     Gait: Gait normal.  Psychiatric:        Mood and Affect: Mood normal.        Behavior: Behavior normal.        Thought Content: Thought content normal.     Most recent functional status assessment:    08/14/2023    9:58 AM  In your present state of health, do you have any difficulty performing the following activities:  Hearing? 0  Vision? 0  Difficulty concentrating or making decisions? 1  Walking or climbing stairs? 0  Dressing or bathing? 0  Doing errands, shopping? 0  Preparing Food and eating ? N  Using the Toilet? N  In the past six months, have you accidently leaked urine? N  Do you have problems with loss of bowel control? N  Managing your Medications? N  Managing your Finances? N  Housekeeping or managing your Housekeeping? N   Most recent fall risk assessment:    08/14/2023   10:01 AM  Fall Risk   Falls in the past year? 0  Number falls in past yr: 0  Injury with Fall? 0  Risk for fall due to : No Fall Risks  Follow up Falls evaluation completed    Most recent depression screenings:     08/14/2023   10:02 AM 04/28/2023    2:57 PM  PHQ 2/9 Scores  PHQ - 2 Score 0 0   Most recent cognitive screening:    08/14/2023   10:02 AM  6CIT Screen  What Year? 0 points  What month? 0 points  What time? 0 points  Count back  from 20 0 points  Months in reverse 0 points  Repeat phrase 0 points  Total Score 0 points   Most recent Audit-C alcohol use screening    08/10/2023    1:22 PM  Alcohol Use Disorder Test (AUDIT)  1. How often do you have a drink containing alcohol? 2  2. How many drinks containing alcohol do you have on a typical day when you are drinking? 0  3. How often do you have six or more drinks on one occasion? 0  AUDIT-C Score 2      Patient-reported   A score of 3 or more in women, and 4 or more in men indicates increased risk for alcohol abuse, EXCEPT if all of the points are from question 1   No results found.  No results found for any visits on 08/14/23.  Assessment & Plan     Annual wellness visit done today including the all of the following: Reviewed patient's Family Medical History Reviewed and updated list of patient's medical providers Assessment of cognitive impairment was done Assessed patient's functional ability Established a written schedule for health screening services Health Risk Assessent Completed and Reviewed  Exercise Activities and Dietary recommendations  Goals      Exercise 3x per week (30 min per time)     Recommend to exercise for 3 days a week for at least 30 minutes at a time.       Increase water intake     Recommend increasing water intake to 4-6 glasses a day.         Immunization History  Administered Date(s) Administered   Fluad Quad(high Dose 65+) 01/21/2019, 01/15/2021, 02/05/2022   Fluad Trivalent(High Dose 65+) 01/31/2023   Influenza, High Dose Seasonal PF 03/01/2017, 01/23/2018   Influenza-Unspecified 02/01/2016, 03/26/2020   PFIZER Comirnaty(Gray Top)Covid-19 Tri-Sucrose Vaccine 10/05/2020,  02/16/2022   PFIZER(Purple Top)SARS-COV-2 Vaccination 05/21/2019, 06/11/2019, 02/03/2020   Pfizer Covid-19 Vaccine Bivalent Booster 5y-11y 10/05/2020, 03/17/2021   Pneumococcal Conjugate-13 04/16/2014   Pneumococcal Polysaccharide-23 03/31/2008, 03/23/2016   Rsv, Bivalent, Protein Subunit Rsvpref,pf Verdis Frederickson) 02/05/2022   Td 06/02/2021   Tdap 06/03/2011   Zoster Recombinant(Shingrix) 05/18/2021, 08/12/2021   Zoster, Live 05/06/2009    Health Maintenance  Topic Date Due   COVID-19 Vaccine (8 - 2024-25 season) 01/15/2023   DEXA SCAN  08/11/2023   Medicare Annual Wellness (AWV)  08/13/2024   DTaP/Tdap/Td (3 - Td or Tdap) 06/03/2031   Pneumonia Vaccine 71+ Years old  Completed   INFLUENZA VACCINE  Completed   Hepatitis C Screening  Completed   Zoster Vaccines- Shingrix  Completed   HPV VACCINES  Aged Out   Fecal DNA (Cologuard)  Discontinued     Discussed health benefits of physical activity, and encouraged her to engage in regular exercise appropriate for her age and condition.    Problem List Items Addressed This Visit       Cardiovascular and Mediastinum   Essential hypertension   Blood pressure is elevated at 150/70 mmHg despite lisinopril 40 mg daily. She has not monitored blood pressure at home due to anxiety about readings. Suspected situational hypertension; home readings needed before medication adjustments. Currently on maximum lisinopril dose; if home readings are elevated, plan to add hydrochlorothiazide. Long-term elevated blood pressure can increase workload on kidneys and heart, necessitating additional medication if not controlled. - Check blood pressure at home and report readings via MyChart - Consider adding hydrochlorothiazide to lisinopril if home blood pressure readings are elevated  Musculoskeletal and Integument   OP (osteoporosis)   Osteopenia had progressed to osteoporosis on last DEXA. Last bone density scan was two years ago; follow-up scan is  due to assess current bone health status. Scheduling challenges due to travel plans, but scan expected in six months. - Order bone density scan for October      Relevant Orders   DG Bone Density     Other   HLD (hyperlipidemia)   Cholesterol levels are well-managed with atorvastatin 20 mg every other day. HDL is high and LDL is low, both favorable. She is compliant with the current medication regimen. - Continue atorvastatin 20 mg every other day      Other Visit Diagnoses       Encounter for annual wellness visit (AWV) in Medicare patient    -  Primary     Encounter for annual physical exam               General Health Maintenance Up to date on colon cancer screening, shingles vaccine, pneumonia shots, and mammogram. Due for a bone density scan and advised to get a flu shot each fall. - Advise plant-forward diet and limit processed foods and alcohol - Get flu shot each fall       Return in about 6 months (around 02/13/2024) for chronic disease f/u.     Shirlee Latch, MD  [facility] [phone] (phone) [phone] (fax)  [facility]

## 2023-08-14 NOTE — Assessment & Plan Note (Signed)
 Osteopenia had progressed to osteoporosis on last DEXA. Last bone density scan was two years ago; follow-up scan is due to assess current bone health status. Scheduling challenges due to travel plans, but scan expected in six months. - Order bone density scan for October

## 2023-09-28 ENCOUNTER — Encounter: Payer: Self-pay | Admitting: Family Medicine

## 2023-10-16 DIAGNOSIS — H903 Sensorineural hearing loss, bilateral: Secondary | ICD-10-CM | POA: Diagnosis not present

## 2023-10-16 DIAGNOSIS — H90A31 Mixed conductive and sensorineural hearing loss, unilateral, right ear with restricted hearing on the contralateral side: Secondary | ICD-10-CM | POA: Diagnosis not present

## 2023-10-16 DIAGNOSIS — H6123 Impacted cerumen, bilateral: Secondary | ICD-10-CM | POA: Diagnosis not present

## 2023-10-16 DIAGNOSIS — H9202 Otalgia, left ear: Secondary | ICD-10-CM | POA: Diagnosis not present

## 2023-10-17 DIAGNOSIS — Z79899 Other long term (current) drug therapy: Secondary | ICD-10-CM | POA: Diagnosis not present

## 2023-10-17 DIAGNOSIS — Z85828 Personal history of other malignant neoplasm of skin: Secondary | ICD-10-CM | POA: Diagnosis not present

## 2023-10-17 DIAGNOSIS — L2081 Atopic neurodermatitis: Secondary | ICD-10-CM | POA: Diagnosis not present

## 2023-10-17 DIAGNOSIS — L281 Prurigo nodularis: Secondary | ICD-10-CM | POA: Diagnosis not present

## 2023-10-17 DIAGNOSIS — L578 Other skin changes due to chronic exposure to nonionizing radiation: Secondary | ICD-10-CM | POA: Diagnosis not present

## 2023-10-17 DIAGNOSIS — L2089 Other atopic dermatitis: Secondary | ICD-10-CM | POA: Diagnosis not present

## 2024-01-28 ENCOUNTER — Encounter: Payer: Self-pay | Admitting: Family Medicine

## 2024-01-29 ENCOUNTER — Ambulatory Visit: Payer: Self-pay

## 2024-01-29 DIAGNOSIS — R040 Epistaxis: Secondary | ICD-10-CM | POA: Diagnosis not present

## 2024-01-29 MED ORDER — ATORVASTATIN CALCIUM 20 MG PO TABS: 20.0000 mg | ORAL_TABLET | ORAL | 3 refills | Status: AC

## 2024-01-29 MED ORDER — LISINOPRIL 40 MG PO TABS: 40.0000 mg | ORAL_TABLET | Freq: Every day | ORAL | 3 refills | Status: AC

## 2024-01-29 NOTE — Telephone Encounter (Signed)
 FYI Only or Action Required?: FYI only for provider.  Patient was last seen in primary care on 08/14/2023 by Myrla Jon HERO, MD.  Called Nurse Triage reporting Hypertension.  Symptoms began today.  Interventions attempted: Prescription medications: lisinopril .  Symptoms are: BP not rechecked.  Triage Disposition: See PCP When Office is Open (Within 3 Days)  Patient/caregiver understands and will follow disposition?: Yes   Copied from CRM [id number]. Topic: Clinical - Red Word Triage >> Jan 29, 2024  3:57 PM Teressa P wrote: Red Word that prompted transfer to Nurse Triage: pt was at [facility] for a nose bleed.  They told her her BP was high and she needed to call her primary.  The top number was 175 and she can not remember the lower number. Reason for Disposition  Systolic BP >= 160 OR Diastolic >= 100  Answer Assessment - Initial Assessment Questions 1. BLOOD PRESSURE: What is your blood pressure? Did you take at least two measurements 5 minutes apart?     175/? 2. ONSET: When did you take your blood pressure?     Taken at ENT office and was urged to contact PCP 3. HOW: How did you take your blood pressure? (e.g., automatic home BP monitor, visiting nurse)     Taken at ENT office 4. HISTORY: Do you have a history of high blood pressure?     yes 5. MEDICINES: Are you taking any medicines for blood pressure? Have you missed any doses recently?     lisinopril  6. OTHER SYMPTOMS: Do you have any symptoms? (e.g., blurred vision, chest pain, difficulty breathing, headache, weakness)     Denies all symptoms 7. PREGNANCY: Is there any chance you are pregnant? When was your last menstrual period?     N/A  Protocols used: Blood Pressure - High-A-AH

## 2024-01-29 NOTE — Telephone Encounter (Signed)
 Rx sent to pharmacy

## 2024-01-29 NOTE — Telephone Encounter (Signed)
 Noted

## 2024-01-30 ENCOUNTER — Ambulatory Visit (INDEPENDENT_AMBULATORY_CARE_PROVIDER_SITE_OTHER): Admitting: Family Medicine

## 2024-01-30 ENCOUNTER — Encounter: Payer: Self-pay | Admitting: Family Medicine

## 2024-01-30 VITALS — BP 175/80 | HR 78 | Resp 16 | Ht 59.0 in | Wt 134.1 lb

## 2024-01-30 DIAGNOSIS — R040 Epistaxis: Secondary | ICD-10-CM

## 2024-01-30 DIAGNOSIS — I10 Essential (primary) hypertension: Secondary | ICD-10-CM

## 2024-01-30 MED ORDER — HYDROCHLOROTHIAZIDE 12.5 MG PO TABS: 12.5000 mg | ORAL_TABLET | Freq: Every day | ORAL | 0 refills | Status: DC

## 2024-01-30 NOTE — Progress Notes (Signed)
 Established patient visit   Patient: Cheryl Hays   DOB: [birth year]   78 y.o. Female  MRN: [id number] Visit Date: 01/30/2024  Today's healthcare provider: LAURAINE LOISE BUOY, DO   Chief Complaint  Patient presents with   Hypertension    Patient reports that her Bp reading was elevated at the ENT office yesterday.   Subjective    Hypertension Associated symptoms include headaches (after placement of nasal packing by ENT). Pertinent negatives include no chest pain, palpitations or shortness of breath.   [address] [name] is a 78 year old female with hypertension who presents with elevated blood pressure and recurrent epistaxis.  She has been experiencing elevated blood pressure readings at home, with a recent measurement of 145 mmHg but reported readings as high as 160 mmHg. She has a history of hypertension and is currently on lisinopril  40 mg daily, which she has been taking consistently without missing doses. She has not been on any other antihypertensive medications recently, except for a brief past trial of amlodipine .  She has experienced recurrent epistaxis, with the most recent one being difficult to stop, prompting her to seek immediate medical attention at ENT. She is experiencing a headache, after a nasal packing was placed yesterday to control her epistaxis, which affected her sleep overnight. No chest pain or shortness of breath. She denies swelling in her legs but mentions having 'heavy legs.'  She reports frequent urination at night, approximately every three hours. She has tried using a CPAP in the past but discontinued it due to discomfort. Her social history includes a routine of taking all her medications at night to ensure consistency.      Medications: Outpatient Medications Prior to Visit  Medication Sig   atorvastatin  (LIPITOR) 20 MG tablet Take 1 tablet (20 mg total) by mouth every other day.   budesonide -formoterol  (SYMBICORT ) 80-4.5 MCG/ACT  inhaler Inhale 2 puffs into the lungs 2 (two) times daily as needed.   Calcium  Carb-Cholecalciferol 600-800 MG-UNIT TABS Take 1 tablet by mouth daily. 1200-800   cephALEXin (KEFLEX) 500 MG capsule Take 500 mg by mouth 3 (three) times daily.   cetirizine  (ZYRTEC ) 10 MG tablet Take 1 tablet (10 mg total) by mouth daily.   collagenase (SANTYL) 250 UNIT/GM ointment Apply 1 Application topically daily.   fluticasone  (FLONASE ) 50 MCG/ACT nasal spray Place 2 sprays into both nostrils daily.   lisinopril  (ZESTRIL ) 40 MG tablet Take 1 tablet (40 mg total) by mouth daily.   mupirocin  ointment (BACTROBAN ) 2 % Apply 1 Application topically 2 (two) times daily.   oxybutynin  (DITROPAN -XL) 5 MG 24 hr tablet TAKE 1 TABLET BY MOUTH AT BEDTIME   Upadacitinib ER (RINVOQ) 15 MG TB24 Take 15 mg by mouth daily.   No facility-administered medications prior to visit.    Review of Systems  HENT:  Positive for nosebleeds.   Respiratory: Negative.  Negative for cough, shortness of breath and wheezing.   Cardiovascular:  Negative for chest pain, palpitations and leg swelling.  Neurological:  Positive for headaches (after placement of nasal packing by ENT). Negative for weakness.        Objective    BP (!) 175/80 (BP Location: Left Arm, Patient Position: Sitting, Cuff Size: Normal)   Pulse 78   Resp 16   Ht 4' 11 (1.499 m)   Wt 134 lb 1.6 oz (60.8 kg)   SpO2 100%   BMI 27.08 kg/m     Physical Exam Constitutional:  Appearance: Normal appearance.  HENT:     Head: Normocephalic and atraumatic.  Eyes:     General: No scleral icterus.    Extraocular Movements: Extraocular movements intact.     Conjunctiva/sclera: Conjunctivae normal.  Cardiovascular:     Rate and Rhythm: Normal rate and regular rhythm.     Pulses: Normal pulses.     Heart sounds: Normal heart sounds.  Pulmonary:     Effort: Pulmonary effort is normal. No respiratory distress.     Breath sounds: Normal breath sounds.   Musculoskeletal:     Right lower leg: No edema.     Left lower leg: No edema.  Skin:    General: Skin is warm and dry.  Neurological:     Mental Status: She is alert and oriented to person, place, and time. Mental status is at baseline.  Psychiatric:        Mood and Affect: Mood normal.        Behavior: Behavior normal.      No results found for any visits on 01/30/24.  Assessment & Plan    Essential hypertension -     hydroCHLOROthiazide ; Take 1 tablet (12.5 mg total) by mouth daily.  Dispense: 30 tablet; Refill: 0  Recurrent epistaxis    Hypertension; recurrent epistaxis Hypertension with recent elevated readings and recurrent epistaxis. Epistaxis likely related to elevated blood pressure. Currently on lisinopril  40 mg daily. Previous trial of amlodipine  in 2020 was not well-tolerated. No significant side effects from current medications.  - Prescribe low dose hydrochlorothiazide  in addition to current lisinopril  regimen. - Instruct to monitor blood pressure once daily, ideally at the same time each day, ensuring she is relaxed and seated properly. - Advise to bring her blood pressure cuff to the next appointment for validation. - Send prescription to [facility] pharmacy.     Return in about 20 days (around 02/19/2024) for as scheduled.      I discussed the assessment and treatment plan with the patient  The patient was provided an opportunity to ask questions and all were answered. The patient agreed with the plan and demonstrated an understanding of the instructions.   The patient was advised to call back or seek an in-person evaluation if the symptoms worsen or if the condition fails to improve as anticipated.    LAURAINE LOISE BUOY, DO  [facility] [phone] (phone) [phone] (fax)  [facility]

## 2024-01-30 NOTE — Patient Instructions (Signed)
Check your blood pressure once daily, and any time you have concerning symptoms like headache, chest pain, dizziness, shortness of breath, or vision changes.   Our goal is less than 140/90.  To appropriately check your blood pressure, make sure you do the following:  1) Avoid caffeine, exercise, or tobacco products for 30 minutes before checking. Empty your bladder. 2) Sit with your back supported in a flat-backed chair. Rest your arm on something flat (arm of the chair, table, etc). 3) Sit still with your feet flat on the floor, resting, for at least 5 minutes.  4) Check your blood pressure. Take 1-2 readings.  5) Write down these readings and bring with you to any provider appointments.  Bring your home blood pressure machine with you to a provider's office for accuracy comparison at least once a year.   Make sure you take your blood pressure medications before you come to any office visit, even if you were asked to fast for labs.

## 2024-02-05 DIAGNOSIS — R040 Epistaxis: Secondary | ICD-10-CM | POA: Diagnosis not present

## 2024-02-19 ENCOUNTER — Encounter: Payer: Self-pay | Admitting: Family Medicine

## 2024-02-19 ENCOUNTER — Ambulatory Visit: Admitting: Family Medicine

## 2024-02-19 VITALS — BP 139/73 | HR 81 | Ht 59.0 in | Wt 133.3 lb

## 2024-02-19 DIAGNOSIS — I10 Essential (primary) hypertension: Secondary | ICD-10-CM | POA: Diagnosis not present

## 2024-02-19 DIAGNOSIS — G4733 Obstructive sleep apnea (adult) (pediatric): Secondary | ICD-10-CM

## 2024-02-19 DIAGNOSIS — R351 Nocturia: Secondary | ICD-10-CM | POA: Diagnosis not present

## 2024-02-19 DIAGNOSIS — R7303 Prediabetes: Secondary | ICD-10-CM | POA: Diagnosis not present

## 2024-02-19 DIAGNOSIS — E7800 Pure hypercholesterolemia, unspecified: Secondary | ICD-10-CM

## 2024-02-19 DIAGNOSIS — G629 Polyneuropathy, unspecified: Secondary | ICD-10-CM

## 2024-02-19 DIAGNOSIS — M810 Age-related osteoporosis without current pathological fracture: Secondary | ICD-10-CM

## 2024-02-19 MED ORDER — DILTIAZEM HCL ER COATED BEADS 120 MG PO CP24: 120.0000 mg | ORAL_CAPSULE | Freq: Every day | ORAL | 1 refills | Status: DC

## 2024-02-19 MED ORDER — OXYBUTYNIN CHLORIDE ER 10 MG PO TB24: 10.0000 mg | ORAL_TABLET | Freq: Every day | ORAL | 2 refills | Status: DC

## 2024-02-19 NOTE — Assessment & Plan Note (Signed)
 Hyperlipidemia is managed with atorvastatin  20 mg every other day. - Continue atorvastatin  20 mg every other day - Order cholesterol levels as part of routine labs - Order A1c as part of routine labs

## 2024-02-19 NOTE — Progress Notes (Signed)
 Established patient visit   Patient: Cheryl Hays   DOB: April 26, 1946   78 y.o. Female  MRN: [id number] Visit Date: 02/19/2024  Today's healthcare provider: Jon Eva, MD   Chief Complaint  Patient presents with   Medical Management of Chronic Issues   Hyperlipidemia   Hypertension    She was last seen for hypertension 2 weeks ago.  BP at that visit was 175/80. Management since that visit includes add low dose hydrochlorothiazide . She reports excellent compliance with treatment. She is not having side effects but urinating a lot. She is following a Regular diet. She is exercising. She does not smoke. Symptoms: none   Subjective    Hyperlipidemia  Hypertension   HPI     Hypertension    Additional comments: She was last seen for hypertension 2 weeks ago.  BP at that visit was 175/80. Management since that visit includes add low dose hydrochlorothiazide . She reports excellent compliance with treatment. She is not having side effects but urinating a lot. She is following a Regular diet. She is exercising. She does not smoke. Symptoms: none      Last edited by Lilian Fitzpatrick, CMA on 02/19/2024  9:59 AM.       Discussed the use of AI scribe software for clinical note transcription with the patient, who gave verbal consent to proceed.  History of Present Illness   Cheryl Hays is a 78 year old female with hypertension who presents for chronic follow-up.  She is on lisinopril  40 mg daily and hydrochlorothiazide  12.5 mg daily for blood pressure management. Her recent blood pressure was 140/90 mmHg. She experiences frequent urination, particularly at night, which she attributes to hydrochlorothiazide . Her family history includes hypertension in her parents, uncle, sister, and niece.  She has prediabetes and is due for annual lab work, including A1c testing. She manages hyperlipidemia with atorvastatin  20 mg every other day. She takes vitamin D and calcium   supplements and is due for a bone density test.  She has obstructive sleep apnea but does not use her CPAP machine due to intolerance.  She experiences nocturia, waking three to four times a night despite taking oxybutynin  5 mg. She also has a numb sensation in her thumb for about a year without progression. She has a history of epistaxis requiring medical attention.         Medications: Outpatient Medications Prior to Visit  Medication Sig   atorvastatin  (LIPITOR) 20 MG tablet Take 1 tablet (20 mg total) by mouth every other day.   budesonide -formoterol  (SYMBICORT ) 80-4.5 MCG/ACT inhaler Inhale 2 puffs into the lungs 2 (two) times daily as needed.   Calcium  Carb-Cholecalciferol 600-800 MG-UNIT TABS Take 1 tablet by mouth daily. 1200-800   cetirizine  (ZYRTEC ) 10 MG tablet Take 1 tablet (10 mg total) by mouth daily.   collagenase (SANTYL) 250 UNIT/GM ointment Apply 1 Application topically daily.   fluticasone  (FLONASE ) 50 MCG/ACT nasal spray Place 2 sprays into both nostrils daily.   lisinopril  (ZESTRIL ) 40 MG tablet Take 1 tablet (40 mg total) by mouth daily.   mupirocin  ointment (BACTROBAN ) 2 % Apply 1 Application topically 2 (two) times daily.   Upadacitinib ER (RINVOQ) 15 MG TB24 Take 15 mg by mouth daily.   [DISCONTINUED] cephALEXin (KEFLEX) 500 MG capsule Take 500 mg by mouth 3 (three) times daily.   [DISCONTINUED] hydrochlorothiazide  (HYDRODIURIL ) 12.5 MG tablet Take 1 tablet (12.5 mg total) by mouth daily.   [DISCONTINUED] oxybutynin  (DITROPAN -XL) 5 MG 24 hr  tablet TAKE 1 TABLET BY MOUTH AT BEDTIME   No facility-administered medications prior to visit.    Review of Systems     Objective    BP 139/73   Pulse 81   Ht 4' 11 (1.499 m)   Wt 133 lb 4.8 oz (60.5 kg)   BMI 26.92 kg/m    Physical Exam Vitals reviewed.  Constitutional:      General: She is not in acute distress.    Appearance: Normal appearance. She is well-developed. She is not diaphoretic.  HENT:      Head: Normocephalic and atraumatic.  Eyes:     General: No scleral icterus.    Conjunctiva/sclera: Conjunctivae normal.  Neck:     Thyroid : No thyromegaly.  Cardiovascular:     Rate and Rhythm: Normal rate and regular rhythm.     Heart sounds: Normal heart sounds. No murmur heard. Pulmonary:     Effort: Pulmonary effort is normal. No respiratory distress.     Breath sounds: Normal breath sounds. No wheezing, rhonchi or rales.  Musculoskeletal:     Cervical back: Neck supple.     Right lower leg: No edema.     Left lower leg: No edema.  Lymphadenopathy:     Cervical: No cervical adenopathy.  Skin:    General: Skin is warm and dry.     Findings: No rash.  Neurological:     Mental Status: She is alert and oriented to person, place, and time. Mental status is at baseline.  Psychiatric:        Mood and Affect: Mood normal.        Behavior: Behavior normal.      No results found for any visits on 02/19/24.  Assessment & Plan     Problem List Items Addressed This Visit       Cardiovascular and Mediastinum   Essential hypertension - Primary   Hypertension remains elevated despite treatment with lisinopril  and hydrochlorothiazide . Hydrochlorothiazide  causes increased urination. Family history of hypertension. - Discontinue hydrochlorothiazide  - Prescribe diltiazem 120 mg daily to manage blood pressure and reduce urinary side effects - Continue lisinopril  40 mg daily - Schedule follow-up in one month to assess blood pressure control - Order routine labs including cholesterol, kidney and liver function, and A1c      Relevant Medications   diltiazem (CARDIZEM CD) 120 MG 24 hr capsule   Other Relevant Orders   CT CARDIAC SCORING (SELF PAY ONLY)   Comprehensive metabolic panel with GFR     Respiratory   OSA (obstructive sleep apnea)   Did not tolerate CPAP Made sleep worse  Mouth tape is helping prn        Musculoskeletal and Integument   OP (osteoporosis)   Repeat  DEXA Continue Vit D supplementation      Relevant Orders   DG Bone Density     Other   HLD (hyperlipidemia)   Hyperlipidemia is managed with atorvastatin  20 mg every other day. - Continue atorvastatin  20 mg every other day - Order cholesterol levels as part of routine labs - Order A1c as part of routine labs      Relevant Medications   diltiazem (CARDIZEM CD) 120 MG 24 hr capsule   Other Relevant Orders   CT CARDIAC SCORING (SELF PAY ONLY)   Comprehensive metabolic panel with GFR   Lipid panel   Prediabetes   Recommend low carb diet Recheck A1c - was back to normal on last check  Relevant Orders   Hemoglobin A1c   Nocturia   Oxybutynin  5 mg is not effectively managing nocturia. - Increase oxybutynin  to 10 mg at night - Advise to use remaining 5 mg tablets by taking two at night until she runs out      Other Visit Diagnoses       Neuropathy               Numbness of thumb, possible peripheral neuropathy Numbness in the thumb has been present for a year without progression. Possible peripheral neuropathy due to a pinched nerve. - Defer further testing unless symptoms worsen or she desires further investigation  General Health Maintenance Routine health maintenance is being addressed. Bone density scan is due, and a coronary calcium  scan is planned for cardiovascular risk stratification. COVID booster discussed as a personal decision. - Schedule bone density scan - Schedule coronary calcium  scan - Discuss COVID booster as a personal decision; she is considering it       Return in about 4 weeks (around 03/18/2024) for chronic disease f/u.       Jon Eva, MD  [facility] [phone] (phone) [phone] (fax)  [facility]

## 2024-02-19 NOTE — Assessment & Plan Note (Signed)
 Oxybutynin  5 mg is not effectively managing nocturia. - Increase oxybutynin  to 10 mg at night - Advise to use remaining 5 mg tablets by taking two at night until she runs out

## 2024-02-19 NOTE — Assessment & Plan Note (Signed)
 Hypertension remains elevated despite treatment with lisinopril  and hydrochlorothiazide . Hydrochlorothiazide  causes increased urination. Family history of hypertension. - Discontinue hydrochlorothiazide  - Prescribe diltiazem 120 mg daily to manage blood pressure and reduce urinary side effects - Continue lisinopril  40 mg daily - Schedule follow-up in one month to assess blood pressure control - Order routine labs including cholesterol, kidney and liver function, and A1c

## 2024-02-19 NOTE — Assessment & Plan Note (Signed)
Did not tolerate CPAP Made sleep worse  Mouth tape is helping prn

## 2024-02-19 NOTE — Assessment & Plan Note (Signed)
 Recommend low carb diet Recheck A1c - was back to normal on last check

## 2024-02-19 NOTE — Assessment & Plan Note (Signed)
 Repeat DEXA Continue Vit D supplementation

## 2024-02-20 ENCOUNTER — Ambulatory Visit: Payer: Self-pay | Admitting: Family Medicine

## 2024-02-20 LAB — LIPID PANEL
Chol/HDL Ratio: 2.3 ratio (ref 0.0–4.4)
Cholesterol, Total: 180 mg/dL (ref 100–199)
HDL: 80 mg/dL (ref >39)
LDL Chol Calc (NIH): 84 mg/dL (ref 0–99)
Triglycerides: 87 mg/dL (ref 0–149)
VLDL Cholesterol Cal: 16 mg/dL (ref 5–40)

## 2024-02-20 LAB — COMPREHENSIVE METABOLIC PANEL WITH GFR
ALT: 20 IU/L (ref 0–32)
AST: 27 IU/L (ref 0–40)
Albumin: 4.5 g/dL (ref 3.8–4.8)
Alkaline Phosphatase: 58 IU/L (ref 49–135)
BUN/Creatinine Ratio: 31 — ABNORMAL HIGH (ref 12–28)
BUN: 23 mg/dL (ref 8–27)
Bilirubin Total: 0.6 mg/dL (ref 0.0–1.2)
CO2: 26 mmol/L (ref 20–29)
Calcium: 9.5 mg/dL (ref 8.7–10.3)
Chloride: 101 mmol/L (ref 96–106)
Creatinine, Ser: 0.75 mg/dL (ref 0.57–1.00)
Globulin, Total: 2.4 g/dL (ref 1.5–4.5)
Glucose: 110 mg/dL — ABNORMAL HIGH (ref 70–99)
Potassium: 4.1 mmol/L (ref 3.5–5.2)
Sodium: 139 mmol/L (ref 134–144)
Total Protein: 6.9 g/dL (ref 6.0–8.5)
eGFR: 81 mL/min/1.73 (ref >59)

## 2024-02-20 LAB — HEMOGLOBIN A1C
Est. average glucose Bld gHb Est-mCnc: 117 mg/dL
Hgb A1c MFr Bld: 5.7 % — ABNORMAL HIGH (ref 4.8–5.6)

## 2024-02-22 DIAGNOSIS — L282 Other prurigo: Secondary | ICD-10-CM | POA: Diagnosis not present

## 2024-02-22 DIAGNOSIS — L209 Atopic dermatitis, unspecified: Secondary | ICD-10-CM | POA: Diagnosis not present

## 2024-02-22 DIAGNOSIS — Z85828 Personal history of other malignant neoplasm of skin: Secondary | ICD-10-CM | POA: Diagnosis not present

## 2024-02-25 ENCOUNTER — Other Ambulatory Visit: Payer: Self-pay | Admitting: Family Medicine

## 2024-02-25 DIAGNOSIS — I10 Essential (primary) hypertension: Secondary | ICD-10-CM

## 2024-02-26 ENCOUNTER — Encounter: Payer: Self-pay | Admitting: Family Medicine

## 2024-02-29 ENCOUNTER — Encounter: Payer: Self-pay | Admitting: Family Medicine

## 2024-02-29 ENCOUNTER — Ambulatory Visit
Admission: RE | Admit: 2024-02-29 | Discharge: 2024-02-29 | Disposition: A | Discharge status: Home / Self Care | Payer: Self-pay | Source: Ambulatory Visit | Attending: Family Medicine | Admitting: Family Medicine

## 2024-02-29 DIAGNOSIS — E7800 Pure hypercholesterolemia, unspecified: Secondary | ICD-10-CM | POA: Insufficient documentation

## 2024-02-29 DIAGNOSIS — I10 Essential (primary) hypertension: Secondary | ICD-10-CM | POA: Insufficient documentation

## 2024-03-07 ENCOUNTER — Ambulatory Visit
Admission: RE | Admit: 2024-03-07 | Discharge: 2024-03-07 | Disposition: A | Discharge status: Home / Self Care | Source: Ambulatory Visit | Attending: Family Medicine | Admitting: Family Medicine

## 2024-03-07 DIAGNOSIS — M8589 Other specified disorders of bone density and structure, multiple sites: Secondary | ICD-10-CM | POA: Diagnosis not present

## 2024-03-07 DIAGNOSIS — M810 Age-related osteoporosis without current pathological fracture: Secondary | ICD-10-CM | POA: Diagnosis not present

## 2024-03-07 DIAGNOSIS — Z780 Asymptomatic menopausal state: Secondary | ICD-10-CM | POA: Diagnosis not present

## 2024-03-14 NOTE — Progress Notes (Signed)
 Cheryl Hays                                          MRN: [id number]   03/14/2024   The [facility] Quality Team Specialist reviewed this patient medical record for the purposes of chart review for care gap closure. The following were reviewed: abstraction for care gap closure-controlling blood pressure.    [facility]BCI Quality Team

## 2024-03-28 ENCOUNTER — Encounter: Payer: Self-pay | Admitting: Family Medicine

## 2024-03-28 ENCOUNTER — Ambulatory Visit: Admitting: Family Medicine

## 2024-03-28 VITALS — BP 118/64 | HR 80 | Ht 59.0 in | Wt 135.5 lb

## 2024-03-28 DIAGNOSIS — R200 Anesthesia of skin: Secondary | ICD-10-CM

## 2024-03-28 DIAGNOSIS — I10 Essential (primary) hypertension: Secondary | ICD-10-CM | POA: Diagnosis not present

## 2024-03-28 DIAGNOSIS — R202 Paresthesia of skin: Secondary | ICD-10-CM

## 2024-03-28 DIAGNOSIS — E7800 Pure hypercholesterolemia, unspecified: Secondary | ICD-10-CM | POA: Diagnosis not present

## 2024-03-28 DIAGNOSIS — J4520 Mild intermittent asthma, uncomplicated: Secondary | ICD-10-CM

## 2024-03-28 MED ORDER — DILTIAZEM HCL ER COATED BEADS 120 MG PO CP24: 120.0000 mg | ORAL_CAPSULE | Freq: Every day | ORAL | 1 refills | Status: AC

## 2024-03-28 NOTE — Assessment & Plan Note (Signed)
 Blood pressure is well-controlled with current medication regimen. Recent readings include 118/64 mmHg and 138 mmHg, with the latter attributed to coffee consumption. No changes in management are necessary. - Continue current antihypertensive regimen. - Sent diltiazem prescription for a 31-month supply to [facility] in [place].

## 2024-03-28 NOTE — Assessment & Plan Note (Signed)
 Albuterol  prn Singulair  no longer covered but was only using prn anyways Does not need controller at this time

## 2024-03-28 NOTE — Progress Notes (Signed)
 Cheryl Hays

## 2024-03-28 NOTE — Progress Notes (Signed)
 Established patient visit   Patient: Cheryl Hays   DOB: 1946-02-13   78 y.o. Female  MRN: [id number] Visit Date: 03/28/2024  Today's healthcare provider: Jon Eva, MD   Chief Complaint  Patient presents with   Medical Management of Chronic Issues   Hypertension    She was last seen for hypertension 5 weeks ago.  BP at that visit was 139/73. Management since that visit includes d/c hydrochlorothiazide , prescribe diltiazem 120 mg daily to manage blood pressure and reduce urinary side effects & continue lisinopril  40 mg daily. She reports excellent compliance with treatment. She is not having side effects.  She is following a Low Sodium diet. She is exercising. She does not smoke. Outside blood pressures are 96-148/46-89. Symptoms: none   Subjective    Hypertension   HPI     Hypertension    Additional comments: She was last seen for hypertension 5 weeks ago.  BP at that visit was 139/73. Management since that visit includes d/c hydrochlorothiazide , prescribe diltiazem 120 mg daily to manage blood pressure and reduce urinary side effects & continue lisinopril  40 mg daily. She reports excellent compliance with treatment. She is not having side effects.  She is following a Low Sodium diet. She is exercising. She does not smoke. Outside blood pressures are 96-148/46-89. Symptoms: none      Last edited by Lilian Fitzpatrick, CMA on 03/28/2024  1:26 PM.       Discussed the use of AI scribe software for clinical note transcription with the patient, who gave verbal consent to proceed.  History of Present Illness   Cheryl Hays is a 78 year old female who presents with low blood pressure and numbness in the thumb.  She experienced diarrhea after a trip to [place], leading to dehydration and low blood pressure, causing dizziness and disorientation. She increased fluid intake and consumed crackers and soup to manage these symptoms.  Persistent numbness is present  in her thumb, isolated to that area without affecting other fingers. There is no progression or additional numbness.  She has asthma, previously managed with Symbicort , now using albuterol  as needed. She is not on regular inhalers.  She discontinued a medication for urinary issues due to severe dry mouth, now experiencing nocturia.  She takes atorvastatin  for cholesterol and diltiazem 120 mg daily for blood pressure. She recently switched her pharmacy to [facility] in [place] for prescription refills.       Medications: Outpatient Medications Prior to Visit  Medication Sig   albuterol  (VENTOLIN  HFA) 108 (90 Base) MCG/ACT inhaler Inhale 1-2 puffs into the lungs every 6 (six) hours as needed for wheezing or shortness of breath.   atorvastatin  (LIPITOR) 20 MG tablet Take 1 tablet (20 mg total) by mouth every other day.   Calcium  Carb-Cholecalciferol 600-800 MG-UNIT TABS Take 1 tablet by mouth daily. 1200-800   cetirizine  (ZYRTEC ) 10 MG tablet Take 1 tablet (10 mg total) by mouth daily.   collagenase (SANTYL) 250 UNIT/GM ointment Apply 1 Application topically daily.   fluticasone  (FLONASE ) 50 MCG/ACT nasal spray Place 2 sprays into both nostrils daily.   lisinopril  (ZESTRIL ) 40 MG tablet Take 1 tablet (40 mg total) by mouth daily.   mupirocin  ointment (BACTROBAN ) 2 % Apply 1 Application topically 2 (two) times daily.   Upadacitinib ER (RINVOQ) 15 MG TB24 Take 15 mg by mouth daily.   [DISCONTINUED] budesonide -formoterol  (SYMBICORT ) 80-4.5 MCG/ACT inhaler Inhale 2 puffs into the lungs 2 (two) times daily as needed.   [  DISCONTINUED] diltiazem (CARDIZEM CD) 120 MG 24 hr capsule Take 1 capsule (120 mg total) by mouth daily.   [DISCONTINUED] oxybutynin  (DITROPAN -XL) 10 MG 24 hr tablet Take 1 tablet (10 mg total) by mouth at bedtime.   No facility-administered medications prior to visit.    Review of Systems     Objective    BP 118/64 (BP Location: Left Arm, Patient Position: Sitting,  Cuff Size: Normal)   Pulse 80   Ht 4' 11 (1.499 m)   Wt 135 lb 8 oz (61.5 kg)   SpO2 100%   BMI 27.37 kg/m  BP Readings from Last 3 Encounters:  03/28/24 118/64  02/19/24 139/73  01/30/24 (!) 175/80      Physical Exam Vitals reviewed.  Constitutional:      General: She is not in acute distress.    Appearance: Normal appearance. She is well-developed. She is not diaphoretic.  HENT:     Head: Normocephalic and atraumatic.  Eyes:     General: No scleral icterus.    Conjunctiva/sclera: Conjunctivae normal.  Neck:     Thyroid : No thyromegaly.  Cardiovascular:     Rate and Rhythm: Normal rate and regular rhythm.     Heart sounds: Normal heart sounds. No murmur heard. Pulmonary:     Effort: Pulmonary effort is normal. No respiratory distress.     Breath sounds: Normal breath sounds. No wheezing, rhonchi or rales.  Musculoskeletal:     Cervical back: Neck supple.     Right lower leg: No edema.     Left lower leg: No edema.  Lymphadenopathy:     Cervical: No cervical adenopathy.  Skin:    General: Skin is warm and dry.     Findings: No rash.  Neurological:     Mental Status: She is alert and oriented to person, place, and time. Mental status is at baseline.  Psychiatric:        Mood and Affect: Mood normal.        Behavior: Behavior normal.      No results found for any visits on 03/28/24.  Assessment & Plan     Problem List Items Addressed This Visit       Cardiovascular and Mediastinum   Essential hypertension   Blood pressure is well-controlled with current medication regimen. Recent readings include 118/64 mmHg and 138 mmHg, with the latter attributed to coffee consumption. No changes in management are necessary. - Continue current antihypertensive regimen. - Sent diltiazem prescription for a 73-month supply to [facility] in [place].      Relevant Medications   diltiazem (CARDIZEM CD) 120 MG 24 hr capsule     Respiratory   Asthma   Albuterol   prn Singulair  no longer covered but was only using prn anyways Does not need controller at this time      Relevant Medications   albuterol  (VENTOLIN  HFA) 108 (90 Base) MCG/ACT inhaler     Other   HLD (hyperlipidemia)   Calcium  CT scan shows a CAC score of 1-99, placing her in the 42nd percentile for her age group, indicating moderate risk. Atorvastatin  is appropriate for managing cholesterol levels and stabilizing plaques. Statins may calcify plaques, potentially affecting CAC score accuracy, but continuation is justified. - Continue atorvastatin  therapy.      Relevant Medications   diltiazem (CARDIZEM CD) 120 MG 24 hr capsule   Other Visit Diagnoses       Numbness and tingling of left thumb    -  Primary  Relevant Orders   Ambulatory referral to Orthopedic Surgery           Numbness of right thumb, suspected nerve entrapment Persistent numbness in the right thumb, suspected to be due to nerve entrapment. Differential diagnosis includes carpal tunnel syndrome, but this is less likely as other fingers do not exhibit symptoms and neg Tinels/Phalens. A nerve conduction study is recommended to determine the location of the nerve issue. - Referred to orthopedics for nerve conduction study to assess nerve entrapment.  General Health Maintenance Bone density is stable compared to previous results. Cardiac scan shows moderate risk with CAC score in the 42nd percentile for age. Mammogram results are available from [facility] for comparison next year. - Continue routine health maintenance and screenings.       Return in about 5 months (around 08/26/2024) for CPE.       Jon Eva, MD  [facility] [phone] (phone) [phone] (fax)  [facility]

## 2024-03-28 NOTE — Assessment & Plan Note (Signed)
 Calcium  CT scan shows a CAC score of 1-99, placing her in the 42nd percentile for her age group, indicating moderate risk. Atorvastatin  is appropriate for managing cholesterol levels and stabilizing plaques. Statins may calcify plaques, potentially affecting CAC score accuracy, but continuation is justified. - Continue atorvastatin  therapy.

## 2024-04-15 ENCOUNTER — Encounter: Payer: Self-pay | Admitting: Family Medicine

## 2024-04-16 ENCOUNTER — Other Ambulatory Visit: Payer: Self-pay | Admitting: Family Medicine

## 2024-04-16 NOTE — Progress Notes (Unsigned)
 Acute visit   Patient: Cheryl Hays   DOB: 1945-07-05   78 y.o. Female  MRN: [id number] PCP: Myrla Jon HERO, MD   Chief Complaint  Patient presents with   Acute Visit    nipple concerns ( 3=4 days) painful      Subjective    Discussed the use of AI scribe software for clinical note transcription with the patient, who gave verbal consent to proceed.  History of Present Illness Cheryl Hays is a 78 year old female who presents with left nipple pain and skin changes of the breast.  She woke up with pain in the left nipple, described as similar to soreness experienced during breastfeeding. The pain is exacerbated by touch and movement, such as turning in bed.  She noticed a red mark on the left breast, which appeared different from the right breast. This change was first observed three days ago.  No fever, chills, or nipple discharge. No new breast masses. She had a normal mammogram in January of the previous year and is due for another in January.  She describes the sensation as a pulling feeling in the left breast, particularly in the armpit area, but does not report any itchiness.    Review of systems as noted in HPI.   Objective    BP (!) 124/58 (BP Location: Left Arm, Patient Position: Sitting, Cuff Size: Normal)   Pulse 77   Resp 16   Ht 4' 11 (1.499 m)   Wt 136 lb (61.7 kg)   SpO2 100%   BMI 27.47 kg/m  Physical Exam Constitutional:      Appearance: Normal appearance.  HENT:     Head: Normocephalic and atraumatic.     Mouth/Throat:     Mouth: Mucous membranes are moist.  Eyes:     Pupils: Pupils are equal, round, and reactive to light.  Pulmonary:     Effort: Pulmonary effort is normal.  Chest:  Breasts:    Right: Normal.     Left: Skin change present. No mass, nipple discharge or tenderness.     Comments: Left breast is erythematous, non-tender. No abscess present. Skin:    General: Skin is warm.  Neurological:     General: No  focal deficit present.     Mental Status: She is alert.       No results found for any visits on 04/17/24.  Assessment & Plan     Problem List Items Addressed This Visit   None Visit Diagnoses       Nipple pain    -  Primary   Relevant Orders   MM 3D DIAGNOSTIC MAMMOGRAM BILATERAL BREAST      Assessment & Plan Left nipple pain Patient with 3 day hx of nipple pain and surrounding erythema of the L breast. No evidence of acute infection. Last mammogram was normal in January 2025. DDx includes contact dermatitis, dry skin, duct ectasia. Cannot rule out malignancy. - Ordered diagnostic mammogram at [facility]. - Will follow up results with patient - Return precautions given   I personally spent a total of 22 minutes in the care of the patient today including preparing to see the patient, getting/reviewing separately obtained history, performing a medically appropriate exam/evaluation, counseling and educating, placing orders, and communicating results .  No orders of the defined types were placed in this encounter.    No follow-ups on file.      Isaiah DELENA Pepper, MD  [facility] [phone] (phone) [phone] (fax)

## 2024-04-17 ENCOUNTER — Ambulatory Visit

## 2024-04-17 VITALS — BP 124/58 | HR 77 | Resp 16 | Ht 59.0 in | Wt 136.0 lb

## 2024-04-17 DIAGNOSIS — N644 Mastodynia: Secondary | ICD-10-CM

## 2024-04-18 DIAGNOSIS — G5603 Carpal tunnel syndrome, bilateral upper limbs: Secondary | ICD-10-CM | POA: Diagnosis not present

## 2024-04-18 DIAGNOSIS — G5762 Lesion of plantar nerve, left lower limb: Secondary | ICD-10-CM | POA: Diagnosis not present

## 2024-04-18 DIAGNOSIS — M79672 Pain in left foot: Secondary | ICD-10-CM | POA: Diagnosis not present

## 2024-04-18 DIAGNOSIS — M1812 Unilateral primary osteoarthritis of first carpometacarpal joint, left hand: Secondary | ICD-10-CM | POA: Diagnosis not present

## 2024-04-22 ENCOUNTER — Telehealth: Payer: Self-pay

## 2024-04-22 NOTE — Telephone Encounter (Unsigned)
 Copied from CRM #[id number]. Topic: Appointments - Scheduling Inquiry for Clinic >> Apr 19, 2024  1:41 PM Emylou G wrote: Reason for CRM: Patient called.. see mychart.. she is checking status of mammogram appt?  Pls call her or mychart

## 2024-04-24 ENCOUNTER — Other Ambulatory Visit: Payer: Self-pay

## 2024-04-24 DIAGNOSIS — N644 Mastodynia: Secondary | ICD-10-CM

## 2024-04-29 ENCOUNTER — Other Ambulatory Visit: Payer: Self-pay | Admitting: Family Medicine

## 2024-04-29 DIAGNOSIS — Z1231 Encounter for screening mammogram for malignant neoplasm of breast: Secondary | ICD-10-CM

## 2024-05-03 ENCOUNTER — Other Ambulatory Visit: Payer: Self-pay | Admitting: Family Medicine

## 2024-05-03 DIAGNOSIS — Z1231 Encounter for screening mammogram for malignant neoplasm of breast: Secondary | ICD-10-CM

## 2024-05-03 NOTE — Telephone Encounter (Signed)
 Pt is scheduled 06/12/24 @ 12:40 pm. OK for e2c2 to advise if patient returns call that we do see the following appointment scheduled. If she has any other questions or concerns in regards to the appointment she should contact [place] at [phone].  Mychart message also sent

## 2024-05-17 ENCOUNTER — Encounter

## 2024-05-17 ENCOUNTER — Other Ambulatory Visit

## 2024-05-23 ENCOUNTER — Encounter: Payer: Self-pay | Admitting: Family Medicine

## 2024-05-23 ENCOUNTER — Ambulatory Visit (INDEPENDENT_AMBULATORY_CARE_PROVIDER_SITE_OTHER): Admitting: Family Medicine

## 2024-05-23 VITALS — BP 155/67 | HR 76 | Temp 98.0°F | Resp 14 | Ht 59.0 in | Wt 132.1 lb

## 2024-05-23 DIAGNOSIS — J029 Acute pharyngitis, unspecified: Secondary | ICD-10-CM

## 2024-05-23 DIAGNOSIS — J069 Acute upper respiratory infection, unspecified: Secondary | ICD-10-CM

## 2024-05-23 LAB — POCT RAPID STREP A (OFFICE): Rapid Strep A Screen: NEGATIVE

## 2024-05-23 MED ORDER — ALBUTEROL SULFATE (2.5 MG/3ML) 0.083% IN NEBU: 2.5000 mg | INHALATION_SOLUTION | Freq: Four times a day (QID) | RESPIRATORY_TRACT | 5 refills | Status: AC | PRN

## 2024-05-23 NOTE — Progress Notes (Signed)
 "     Acute visit   Patient: Cheryl Hays   DOB: 10-01-1945   79 y.o. Female  MRN: [id number] PCP: Myrla Jon HERO, MD   Chief Complaint  Patient presents with   Acute Visit    Cough, sore throat, congestion x 1 week. Symptoms did improved. Sore throat reappeared, mild cough 2 days ago otc: mucinex, nyquil. Covid test negative at home.   Subjective    Discussed the use of AI scribe software for clinical note transcription with the patient, who gave verbal consent to proceed.  History of Present Illness   Cheryl Hays is a 79 year old female who presents with acute cough, sore throat, and congestion.  She has had cough, sore throat, and congestion for over a week. Symptoms initially improved, but the sore throat has worsened again and she is now hoarse. Congestion was significant at first but has mostly cleared. She has not had fever or shortness of breath. A home COVID test was negative. She has a nebulizer with expired medication that previously helped her breathing during similar episodes and she uses a rescue inhaler occasionally. She has used lemon, honey, vinegar, nasal saline, and Flonase  with some relief. She is worried about being contagious and is hoping to recover before traveling to [place] on the 16th.        Review of Systems  Objective    BP (!) 155/67   Pulse 76   Temp 98 F (36.7 C) (Oral)   Resp 14   Ht 4' 11 (1.499 m)   Wt 132 lb 1.6 oz (59.9 kg)   SpO2 98%   BMI 26.68 kg/m    Physical Exam Vitals reviewed.  Constitutional:      General: She is not in acute distress.    Appearance: Normal appearance. She is well-developed. She is not diaphoretic.  HENT:     Head: Normocephalic and atraumatic.     Right Ear: Tympanic membrane, ear canal and external ear normal.     Left Ear: Tympanic membrane, ear canal and external ear normal.     Nose: Congestion present.     Mouth/Throat:     Mouth: Mucous membranes are moist.     Pharynx:  Oropharynx is clear. Posterior oropharyngeal erythema (mild) present. No oropharyngeal exudate.  Eyes:     General: No scleral icterus.    Conjunctiva/sclera: Conjunctivae normal.     Pupils: Pupils are equal, round, and reactive to light.  Cardiovascular:     Rate and Rhythm: Normal rate and regular rhythm.     Heart sounds: Normal heart sounds. No murmur heard. Pulmonary:     Effort: Pulmonary effort is normal. No respiratory distress.     Breath sounds: Normal breath sounds. No wheezing or rales.  Musculoskeletal:     Cervical back: Neck supple.     Right lower leg: No edema.     Left lower leg: No edema.  Lymphadenopathy:     Cervical: No cervical adenopathy.  Skin:    General: Skin is warm and dry.  Neurological:     Mental Status: She is alert.       No results found for any visits on 05/23/24.  Assessment & Plan     Problem List Items Addressed This Visit   None Visit Diagnoses       Sore throat    -  Primary   Relevant Orders   POCT rapid strep A     Viral URI with  cough               Acute upper respiratory infection Symptoms of cough, sore throat, and congestion for one week. Initial improvement followed by exacerbation of sore throat. Negative COVID test and strep test. Likely viral etiology, possibly flu, but antivirals are not indicated due to timing. Symptoms expected to resolve in 7-10 days with lingering cough. No fever or shortness of breath. Examination reveals hoarseness and mild throat redness, no signs of strep throat or ear infection. Likely drainage component contributing to sore throat. - Continue supportive care with rest and hydration. - Consider using Flonase  or nasal saline for nasal congestion. - Use honey cough drops or lozenges for throat soothing. - Wear a mask in public to prevent potential spread.  Repeat prescription for nebulized albuterol  Expired nebulized albuterol  medication. Nebulizer machine available at home. Albuterol  is  similar to rescue inhaler medication. Prefers not to purchase a large quantity due to infrequent use. - Prescribed 10 vials of nebulized albuterol .        Meds ordered this encounter  Medications   albuterol  (PROVENTIL ) (2.5 MG/3ML) 0.083% nebulizer solution    Sig: Take 3 mLs (2.5 mg total) by nebulization every 6 (six) hours as needed for wheezing or shortness of breath.    Dispense:  30 mL    Refill:  5     Return if symptoms worsen or fail to improve.      Jon Eva, MD  [facility] [phone] (phone) [phone] (fax)  [facility]  "

## 2024-06-12 ENCOUNTER — Ambulatory Visit (INDEPENDENT_AMBULATORY_CARE_PROVIDER_SITE_OTHER)

## 2024-06-12 ENCOUNTER — Encounter

## 2024-06-12 ENCOUNTER — Ambulatory Visit
Admission: RE | Admit: 2024-06-12 | Discharge: 2024-06-12 | Disposition: A | Discharge status: Home / Self Care | Source: Ambulatory Visit | Attending: Family Medicine | Admitting: Family Medicine

## 2024-06-12 ENCOUNTER — Ambulatory Visit: Payer: Self-pay

## 2024-06-12 VITALS — BP 109/56 | HR 75 | Temp 97.7°F | Wt 137.4 lb

## 2024-06-12 DIAGNOSIS — N3001 Acute cystitis with hematuria: Secondary | ICD-10-CM | POA: Diagnosis not present

## 2024-06-12 DIAGNOSIS — Z1231 Encounter for screening mammogram for malignant neoplasm of breast: Secondary | ICD-10-CM | POA: Insufficient documentation

## 2024-06-12 LAB — POCT URINALYSIS DIPSTICK
Bilirubin, UA: NEGATIVE
Glucose, UA: NEGATIVE
Ketones, UA: NEGATIVE
Nitrite, UA: POSITIVE
Protein, UA: POSITIVE — AB
Spec Grav, UA: <=1.005 — AB
Urobilinogen, UA: 0.2 U/dL
pH, UA: 8.5 — AB

## 2024-06-12 MED ORDER — CEPHALEXIN 500 MG PO CAPS: 500.0000 mg | ORAL_CAPSULE | Freq: Two times a day (BID) | ORAL | 0 refills | Status: AC

## 2024-06-12 NOTE — Progress Notes (Signed)
" °  ° °  Acute visit   Patient: Cheryl Hays   DOB: [birth year]   79 y.o. Female  MRN: [id number] PCP: Myrla Jon HERO, MD   Chief Complaint  Patient presents with   Urinary Frequency    With some burning  No discharge some odor No itching    Subjective     HPI:  Patient with several days of urinary frequency, urgency, and burning. Took 1 dose of Azo without much relief. Denies fevers, chills, abdominal pain, back pain.  Review of systems as noted in HPI.   Objective    BP (!) 109/56   Pulse 75   Temp 97.7 F (36.5 C) (Oral)   Wt 137 lb 6.4 oz (62.3 kg)   SpO2 98%   BMI 27.75 kg/m  Physical Exam Constitutional:      Appearance: Normal appearance.  HENT:     Head: Normocephalic and atraumatic.     Mouth/Throat:     Mouth: Mucous membranes are moist.  Eyes:     Pupils: Pupils are equal, round, and reactive to light.  Pulmonary:     Effort: Pulmonary effort is normal.  Skin:    General: Skin is warm.  Neurological:     General: No focal deficit present.     Mental Status: She is alert.       Results for orders placed or performed in visit on 06/12/24  POCT Urinalysis Dipstick  Result Value Ref Range   Color, UA     Clarity, UA     Glucose, UA Negative Negative   Bilirubin, UA n    Ketones, UA n    Spec Grav, UA <=1.005 (A) 1.010 - 1.025   Blood, UA mod    pH, UA 8.5 (A) 5.0 - 8.0   Protein, UA Positive (A) Negative   Urobilinogen, UA 0.2 0.2 or 1.0 E.U./dL   Nitrite, UA pos    Leukocytes, UA Moderate (2+) (A) Negative   Appearance     Odor      Assessment & Plan     Problem List Items Addressed This Visit       Genitourinary   Acute cystitis with hematuria - Primary   Patient with 2 days of urinary frequency, urgency, and burning. POC urine concerning for UTI with  + nitrite, 2+ LE, moderate blood. - Will treat with Keflex  500mg  BID x 7 days - Will send urine for culture - Return precautions given      Relevant Medications    cephALEXin  (KEFLEX ) 500 MG capsule   Other Relevant Orders   POCT Urinalysis Dipstick (Completed)   Urine Culture      Meds ordered this encounter  Medications   cephALEXin  (KEFLEX ) 500 MG capsule    Sig: Take 1 capsule (500 mg total) by mouth 2 (two) times daily for 7 days.    Dispense:  14 capsule    Refill:  0     No follow-ups on file.      Isaiah DELENA Pepper, MD  [name] [phone] (phone) [phone] (fax) "

## 2024-06-12 NOTE — Telephone Encounter (Addendum)
" °  FYI Only or Action Required?: FYI only for provider: appointment scheduled on 06/12/2024.  Patient was last seen in primary care on 05/23/2024 by Myrla Jon HERO, MD.  Called Nurse Triage reporting Urinary Frequency.  Symptoms began 2 day ago.  Interventions attempted: OTC medications: bladder relief tablet and Other: drinking baking soda and water.  Symptoms are: unchanged.  Triage Disposition: See Physician Within 24 Hours  Patient/caregiver understands and will follow disposition?: Yes Message from Deaijah H sent at 06/12/2024  9:32 AM EST  Reason for Triage: Bladder infection urgency & burning   Reason for Disposition  Pain or burning with passing urine (urination)  Answer Assessment - Initial Assessment Questions 1. SYMPTOM: What's the main symptom you're concerned about? (e.g., frequency, incontinence)     Urinary frequency and burning 2. ONSET: When did the  symptoms  start?     Two days ago 3. PAIN: Is there any pain? If Yes, ask: How bad is it? (Scale: 1-10; mild, moderate, severe)     Denies pain 4. CAUSE: What do you think is causing the symptoms?     UTI 5. OTHER SYMPTOMS: Do you have any other symptoms? (e.g., blood in urine, fever, flank pain, pain with urination)     denies  Protocols used: Urinary Symptoms-A-AH, Genital Injury - Female-A-AH  "

## 2024-06-12 NOTE — Assessment & Plan Note (Signed)
 Patient with 2 days of urinary frequency, urgency, and burning. POC urine concerning for UTI with  + nitrite, 2+ LE, moderate blood. - Will treat with Keflex  500mg  BID x 7 days - Will send urine for culture - Return precautions given

## 2024-06-17 ENCOUNTER — Ambulatory Visit: Payer: Self-pay | Admitting: Family Medicine

## 2024-06-18 ENCOUNTER — Ambulatory Visit: Payer: Self-pay

## 2024-06-18 LAB — URINE CULTURE

## 2024-06-18 LAB — SPECIMEN STATUS REPORT

## 2024-08-15 ENCOUNTER — Encounter: Admitting: Family Medicine
